# Patient Record
Sex: Female | Born: 1960
Health system: Southern US, Community
[De-identification: ages and names within clinical notes are randomized; demographics above are authoritative.]

## PROBLEM LIST (undated history)

## (undated) DIAGNOSIS — M7989 Other specified soft tissue disorders: Secondary | ICD-10-CM

## (undated) DIAGNOSIS — R8290 Unspecified abnormal findings in urine: Secondary | ICD-10-CM

## (undated) DIAGNOSIS — R413 Other amnesia: Secondary | ICD-10-CM

## (undated) DIAGNOSIS — K5792 Diverticulitis of intestine, part unspecified, without perforation or abscess without bleeding: Secondary | ICD-10-CM

## (undated) DIAGNOSIS — F329 Major depressive disorder, single episode, unspecified: Secondary | ICD-10-CM

## (undated) DIAGNOSIS — R079 Chest pain, unspecified: Secondary | ICD-10-CM

## (undated) DIAGNOSIS — F332 Major depressive disorder, recurrent severe without psychotic features: Secondary | ICD-10-CM

## (undated) DIAGNOSIS — L409 Psoriasis, unspecified: Secondary | ICD-10-CM

## (undated) DIAGNOSIS — F988 Other specified behavioral and emotional disorders with onset usually occurring in childhood and adolescence: Secondary | ICD-10-CM

## (undated) DIAGNOSIS — F32A Depression, unspecified: Secondary | ICD-10-CM

## (undated) DIAGNOSIS — N3000 Acute cystitis without hematuria: Secondary | ICD-10-CM

## (undated) DIAGNOSIS — Z96651 Presence of right artificial knee joint: Secondary | ICD-10-CM

## (undated) HISTORY — PX: KNEE SURGERY: SHX244

## (undated) HISTORY — DX: Psoriasis, unspecified: L40.9

## (undated) HISTORY — PX: APPENDECTOMY: SHX54

---

## 1997-11-02 ENCOUNTER — Other Ambulatory Visit: Admission: RE | Admit: 1997-11-02 | Discharge: 1997-11-02 | Payer: Self-pay | Admitting: Dermatology

## 2000-11-12 ENCOUNTER — Inpatient Hospital Stay (HOSPITAL_COMMUNITY): Admission: EM | Admit: 2000-11-12 | Discharge: 2000-11-14 | Payer: Self-pay | Admitting: *Deleted

## 2001-01-14 ENCOUNTER — Encounter: Payer: Self-pay | Admitting: Emergency Medicine

## 2001-01-14 ENCOUNTER — Emergency Department (HOSPITAL_COMMUNITY): Admission: EM | Admit: 2001-01-14 | Discharge: 2001-01-14 | Payer: Self-pay | Admitting: Emergency Medicine

## 2001-01-15 ENCOUNTER — Emergency Department (HOSPITAL_COMMUNITY): Admission: EM | Admit: 2001-01-15 | Discharge: 2001-01-15 | Payer: Self-pay | Admitting: Emergency Medicine

## 2001-01-15 ENCOUNTER — Encounter: Payer: Self-pay | Admitting: Emergency Medicine

## 2001-08-26 ENCOUNTER — Emergency Department (HOSPITAL_COMMUNITY): Admission: EM | Admit: 2001-08-26 | Discharge: 2001-08-27 | Payer: Self-pay | Admitting: Emergency Medicine

## 2001-08-28 ENCOUNTER — Encounter: Admission: RE | Admit: 2001-08-28 | Discharge: 2001-08-28 | Payer: Self-pay | Admitting: Psychiatry

## 2001-09-04 ENCOUNTER — Encounter: Admission: RE | Admit: 2001-09-04 | Discharge: 2001-09-04 | Payer: Self-pay | Admitting: Psychiatry

## 2001-09-08 ENCOUNTER — Encounter: Admission: RE | Admit: 2001-09-08 | Discharge: 2001-09-08 | Payer: Self-pay | Admitting: Psychiatry

## 2001-09-16 ENCOUNTER — Encounter: Admission: RE | Admit: 2001-09-16 | Discharge: 2001-09-16 | Payer: Self-pay | Admitting: *Deleted

## 2001-09-19 ENCOUNTER — Encounter: Admission: RE | Admit: 2001-09-19 | Discharge: 2001-09-19 | Payer: Self-pay | Admitting: *Deleted

## 2001-10-10 ENCOUNTER — Encounter: Admission: RE | Admit: 2001-10-10 | Discharge: 2001-10-10 | Payer: Self-pay | Admitting: *Deleted

## 2001-11-10 ENCOUNTER — Encounter: Admission: RE | Admit: 2001-11-10 | Discharge: 2001-11-10 | Payer: Self-pay | Admitting: *Deleted

## 2001-11-11 ENCOUNTER — Encounter: Admission: RE | Admit: 2001-11-11 | Discharge: 2001-11-11 | Payer: Self-pay | Admitting: *Deleted

## 2001-12-29 ENCOUNTER — Encounter: Admission: RE | Admit: 2001-12-29 | Discharge: 2001-12-29 | Payer: Self-pay | Admitting: *Deleted

## 2002-03-02 ENCOUNTER — Inpatient Hospital Stay (HOSPITAL_COMMUNITY): Admission: RE | Admit: 2002-03-02 | Discharge: 2002-03-04 | Payer: Self-pay | Admitting: Orthopedic Surgery

## 2002-03-02 ENCOUNTER — Encounter: Payer: Self-pay | Admitting: Orthopedic Surgery

## 2002-03-26 ENCOUNTER — Encounter: Admission: RE | Admit: 2002-03-26 | Discharge: 2002-03-26 | Payer: Self-pay | Admitting: *Deleted

## 2002-07-15 ENCOUNTER — Encounter: Admission: RE | Admit: 2002-07-15 | Discharge: 2002-07-15 | Payer: Self-pay | Admitting: *Deleted

## 2003-11-23 ENCOUNTER — Other Ambulatory Visit: Admission: RE | Admit: 2003-11-23 | Discharge: 2003-11-23 | Payer: Self-pay | Admitting: Family Medicine

## 2004-04-24 ENCOUNTER — Emergency Department (HOSPITAL_COMMUNITY): Admission: EM | Admit: 2004-04-24 | Discharge: 2004-04-24 | Payer: Self-pay | Admitting: Emergency Medicine

## 2004-12-20 ENCOUNTER — Other Ambulatory Visit: Admission: RE | Admit: 2004-12-20 | Discharge: 2004-12-20 | Payer: Self-pay | Admitting: Family Medicine

## 2006-03-20 ENCOUNTER — Other Ambulatory Visit: Admission: RE | Admit: 2006-03-20 | Discharge: 2006-03-20 | Payer: Self-pay | Admitting: Family Medicine

## 2006-09-19 ENCOUNTER — Encounter: Admission: RE | Admit: 2006-09-19 | Discharge: 2006-09-19 | Payer: Self-pay | Admitting: Family Medicine

## 2006-10-22 ENCOUNTER — Other Ambulatory Visit: Admission: RE | Admit: 2006-10-22 | Discharge: 2006-10-22 | Payer: Self-pay | Admitting: Family Medicine

## 2007-06-16 ENCOUNTER — Other Ambulatory Visit: Admission: RE | Admit: 2007-06-16 | Discharge: 2007-06-16 | Payer: Self-pay | Admitting: Obstetrics and Gynecology

## 2007-09-23 ENCOUNTER — Encounter: Admission: RE | Admit: 2007-09-23 | Discharge: 2007-09-23 | Payer: Self-pay | Admitting: Obstetrics and Gynecology

## 2008-01-04 ENCOUNTER — Emergency Department (HOSPITAL_COMMUNITY): Admission: EM | Admit: 2008-01-04 | Discharge: 2008-01-04 | Payer: Self-pay | Admitting: Emergency Medicine

## 2008-07-23 ENCOUNTER — Other Ambulatory Visit: Admission: RE | Admit: 2008-07-23 | Discharge: 2008-07-23 | Payer: Self-pay | Admitting: Obstetrics and Gynecology

## 2008-10-15 ENCOUNTER — Encounter: Admission: RE | Admit: 2008-10-15 | Discharge: 2008-10-15 | Payer: Self-pay | Admitting: Family Medicine

## 2009-07-03 ENCOUNTER — Emergency Department (HOSPITAL_BASED_OUTPATIENT_CLINIC_OR_DEPARTMENT_OTHER): Admission: EM | Admit: 2009-07-03 | Discharge: 2009-07-04 | Payer: Self-pay | Admitting: Emergency Medicine

## 2009-07-04 ENCOUNTER — Ambulatory Visit: Payer: Self-pay | Admitting: Diagnostic Radiology

## 2009-09-03 ENCOUNTER — Emergency Department (HOSPITAL_BASED_OUTPATIENT_CLINIC_OR_DEPARTMENT_OTHER): Admission: EM | Admit: 2009-09-03 | Discharge: 2009-09-04 | Payer: Self-pay | Admitting: Emergency Medicine

## 2009-09-03 ENCOUNTER — Ambulatory Visit: Payer: Self-pay | Admitting: Diagnostic Radiology

## 2009-11-04 ENCOUNTER — Encounter: Admission: RE | Admit: 2009-11-04 | Discharge: 2009-11-04 | Payer: Self-pay | Admitting: Obstetrics and Gynecology

## 2009-11-11 ENCOUNTER — Encounter: Admission: RE | Admit: 2009-11-11 | Discharge: 2009-11-11 | Payer: Self-pay | Admitting: Obstetrics and Gynecology

## 2009-12-05 ENCOUNTER — Ambulatory Visit: Payer: Self-pay | Admitting: Diagnostic Radiology

## 2009-12-05 ENCOUNTER — Emergency Department (HOSPITAL_BASED_OUTPATIENT_CLINIC_OR_DEPARTMENT_OTHER): Admission: EM | Admit: 2009-12-05 | Discharge: 2009-12-05 | Payer: Self-pay | Admitting: Emergency Medicine

## 2010-08-06 ENCOUNTER — Encounter: Payer: Self-pay | Admitting: Orthopedic Surgery

## 2010-08-06 ENCOUNTER — Encounter: Payer: Self-pay | Admitting: Obstetrics and Gynecology

## 2010-08-17 ENCOUNTER — Other Ambulatory Visit: Payer: Self-pay | Admitting: Obstetrics and Gynecology

## 2010-08-17 ENCOUNTER — Other Ambulatory Visit (HOSPITAL_COMMUNITY)
Admission: RE | Admit: 2010-08-17 | Discharge: 2010-08-17 | Disposition: A | Discharge status: Home / Self Care | Payer: 59 | Source: Ambulatory Visit | Attending: Obstetrics and Gynecology | Admitting: Obstetrics and Gynecology

## 2010-08-17 DIAGNOSIS — Z01419 Encounter for gynecological examination (general) (routine) without abnormal findings: Secondary | ICD-10-CM | POA: Insufficient documentation

## 2010-09-18 ENCOUNTER — Emergency Department (INDEPENDENT_AMBULATORY_CARE_PROVIDER_SITE_OTHER): Payer: 59

## 2010-09-18 ENCOUNTER — Emergency Department (HOSPITAL_BASED_OUTPATIENT_CLINIC_OR_DEPARTMENT_OTHER)
Admission: EM | Admit: 2010-09-18 | Discharge: 2010-09-18 | Disposition: A | Discharge status: Home / Self Care | Payer: 59 | Attending: Emergency Medicine | Admitting: Emergency Medicine

## 2010-09-18 DIAGNOSIS — R079 Chest pain, unspecified: Secondary | ICD-10-CM | POA: Insufficient documentation

## 2010-09-18 DIAGNOSIS — F329 Major depressive disorder, single episode, unspecified: Secondary | ICD-10-CM | POA: Insufficient documentation

## 2010-09-18 DIAGNOSIS — F3289 Other specified depressive episodes: Secondary | ICD-10-CM | POA: Insufficient documentation

## 2010-09-18 DIAGNOSIS — Z79899 Other long term (current) drug therapy: Secondary | ICD-10-CM | POA: Insufficient documentation

## 2010-09-18 DIAGNOSIS — J45909 Unspecified asthma, uncomplicated: Secondary | ICD-10-CM | POA: Insufficient documentation

## 2010-09-18 DIAGNOSIS — M542 Cervicalgia: Secondary | ICD-10-CM | POA: Insufficient documentation

## 2010-09-18 LAB — POCT CARDIAC MARKERS
Myoglobin, poc: 45.1 ng/mL (ref 12–200)
Troponin i, poc: <0.05 ng/mL (ref 0.00–0.09)

## 2010-09-18 LAB — CBC
HCT: 37.8 % (ref 36.0–46.0)
MCV: 90.4 fL (ref 78.0–100.0)
Platelets: 252 10*3/uL (ref 150–400)
RDW: 12.5 % (ref 11.5–15.5)

## 2010-09-18 LAB — BASIC METABOLIC PANEL: GFR calc non Af Amer: >60 mL/min (ref >60)

## 2010-09-18 LAB — DIFFERENTIAL
Basophils Absolute: 0 10*3/uL (ref 0.0–0.1)
Lymphocytes Relative: 35 % (ref 12–46)
Lymphs Abs: 1.7 10*3/uL (ref 0.7–4.0)
Neutro Abs: 2.6 10*3/uL (ref 1.7–7.7)

## 2010-10-02 LAB — DIFFERENTIAL
Basophils Absolute: 0.1 10*3/uL (ref 0.0–0.1)
Eosinophils Absolute: 0.1 10*3/uL (ref 0.0–0.7)
Lymphocytes Relative: 13 % (ref 12–46)
Lymphs Abs: 1.2 10*3/uL (ref 0.7–4.0)
Neutro Abs: 7.7 10*3/uL (ref 1.7–7.7)
Neutrophils Relative %: 80 % — ABNORMAL HIGH (ref 43–77)

## 2010-10-02 LAB — CBC
HCT: 37.5 % (ref 36.0–46.0)
MCHC: 32.9 g/dL (ref 30.0–36.0)
MCV: 91.7 fL (ref 78.0–100.0)
RBC: 4.09 MIL/uL (ref 3.87–5.11)

## 2010-10-02 LAB — URINALYSIS, ROUTINE W REFLEX MICROSCOPIC
Hgb urine dipstick: NEGATIVE
Nitrite: NEGATIVE
Urobilinogen, UA: 0.2 mg/dL (ref 0.0–1.0)

## 2010-10-02 LAB — COMPREHENSIVE METABOLIC PANEL
ALT: 15 U/L (ref 0–35)
AST: 25 U/L (ref 0–37)
Chloride: 109 mEq/L (ref 96–112)
Potassium: 4.3 mEq/L (ref 3.5–5.1)
Sodium: 141 mEq/L (ref 135–145)

## 2010-10-02 LAB — PREGNANCY, URINE: Preg Test, Ur: NEGATIVE

## 2010-10-16 LAB — URINALYSIS, ROUTINE W REFLEX MICROSCOPIC
Glucose, UA: NEGATIVE mg/dL
Hgb urine dipstick: NEGATIVE
Specific Gravity, Urine: 1.014 (ref 1.005–1.030)

## 2010-10-16 LAB — CBC
MCHC: 34.4 g/dL (ref 30.0–36.0)
MCV: 91.4 fL (ref 78.0–100.0)
RBC: 4.18 MIL/uL (ref 3.87–5.11)
WBC: 8.2 10*3/uL (ref 4.0–10.5)

## 2010-10-16 LAB — DIFFERENTIAL
Lymphocytes Relative: 22 % (ref 12–46)
Monocytes Absolute: 0.6 10*3/uL (ref 0.1–1.0)
Monocytes Relative: 7 % (ref 3–12)
Neutro Abs: 5.6 10*3/uL (ref 1.7–7.7)

## 2010-10-16 LAB — COMPREHENSIVE METABOLIC PANEL
Albumin: 4.1 g/dL (ref 3.5–5.2)
BUN: 14 mg/dL (ref 6–23)
CO2: 25 mEq/L (ref 19–32)
Calcium: 9.3 mg/dL (ref 8.4–10.5)
Chloride: 108 mEq/L (ref 96–112)
GFR calc Af Amer: >60 mL/min (ref >60)
Glucose, Bld: 96 mg/dL (ref 70–99)
Potassium: 4.1 mEq/L (ref 3.5–5.1)
Sodium: 143 mEq/L (ref 135–145)
Total Bilirubin: 0.3 mg/dL (ref 0.3–1.2)
Total Protein: 7.4 g/dL (ref 6.0–8.3)

## 2010-10-16 LAB — URINE MICROSCOPIC-ADD ON

## 2010-11-01 ENCOUNTER — Other Ambulatory Visit: Payer: Self-pay | Admitting: Obstetrics and Gynecology

## 2010-11-01 DIAGNOSIS — Z1231 Encounter for screening mammogram for malignant neoplasm of breast: Secondary | ICD-10-CM

## 2010-11-14 ENCOUNTER — Ambulatory Visit
Admission: RE | Admit: 2010-11-14 | Discharge: 2010-11-14 | Disposition: A | Discharge status: Home / Self Care | Payer: 59 | Source: Ambulatory Visit | Attending: Obstetrics and Gynecology | Admitting: Obstetrics and Gynecology

## 2010-11-14 DIAGNOSIS — Z1231 Encounter for screening mammogram for malignant neoplasm of breast: Secondary | ICD-10-CM

## 2010-12-01 NOTE — H&P (Signed)
NAME:  Tara [name], Tara [name] NO.:  [id number]   MEDICAL RECORD NO.:  [id number]                   PATIENT TYPE:   LOCATION:                                       FACILITY:   PHYSICIAN:  [id number]                                DATE OF BIRTH:  03-20-1961   DATE OF ADMISSION:  03/02/2002  DATE OF DISCHARGE:                                HISTORY & PHYSICAL   CHIEF COMPLAINT:  Progressively worsening right knee pain.   HISTORY OF PRESENT ILLNESS:  This 50 year old white female patient presented  initially to Dr. Hoy Register with complaints of right knee pain.  She has  had problems with her right knee on and off since the age of 42, when she  fell and suffered a medical meniscus tear and subsequently underwent an open  medial meniscectomy.  Since that time, she has had several procedures on her  knee, several while in high school, including a knee arthroscopy and then an  open chondroplasty, and then a second knee arthroscopy to check its progress  while she was still in high school.  She did well until about 1988, when she  was having more pain in her right knee and saw Dr. Cleophas Dunker.  At that time,  he did a right knee arthroscopy with a chondroplasty.  That did relieve her  pain for several years, but since that time it has been getting  progressively worse.   At this point, the right knee pain is described as a constant, dull type of  pain almost like a toothache which becomes sharp with certain movements.  The pain seems to be located about her medial joint line with occasional  radiation distally into the proximal tibia.  The pain increases with  prolonged weightbearing and decreases with rest, the use of Advil, or a  heating pad on her knee.  The knee does feel like it slips at times, and  almost gives way on her.  The knee does swell, and also she feels like she  can feel it grinding as she moves it.  There is no locking or catching in  the knee.  She  does have occasional night pain if she has been on her feet a  lot during that day.  She is not ambulating with any assistive devices.  She  has had a cortisone shot in the knee in the past for a total of two times.  The first time helped for a short amount of time, and the second one did not  relieve her pain at all.  She was referred to Dr. Sherlean Foot by Dr. Cleophas Dunker due  to her medial compartment osteoarthritis and the need for uniarthroplasty.'   ALLERGIES:  CODEINE causes pruritus and a rash.   CURRENT MEDICATIONS:  1. Lexapro 10 mg p.o. q.d.  2. Serevent inhaler one puff inhaled q.d. p.r.n. asthma.  Last dose last     week.  3. Minocycline 100 mg p.o. q.d.  4. Albuterol inhaler 1 puff inhaled q.4h. p.r.n. asthma.  Last dose     approximately two months ago.  5. Advil 200 mg two tablets p.o. q.4h. p.r.n. pain.   PAST MEDICAL HISTORY:  1. Asthma, she had exercise induced asthma as a child, and then had several     years without symptoms, and now has just seasonal asthma.  2. Acne.  3. Depression, she has had this intermittently for the last 20 years.  4. Psoriasis.  She has this mainly on her elbows now, and that was diagnosed     at age 60.   She denies any history of diabetes mellitus, hypertension, thyroid disease,  hiatal hernia, peptic ulcer disease, reflux, heart disease, or any other  chronic medical condition other than noted previously.    PAST SURGICAL HISTORY:  1. Open medial meniscectomy with bone spur excision while she was in high     school.  2. Right knee arthroscopy while in high school.  3. Open chondroplasty medial compartment right knee while in high school.  4. Right knee arthroscopy and chondroplasty while in high school.  5. Right knee arthroscopy with chondroplasty in 1988 by Dr. Claude Manges.     [place].  6. Appendectomy at age 38.   SOCIAL HISTORY:  She denies any history of cigarette smoking, alcohol use,  or drug use.  She is married and has four  children.  She, her husband and  her four children live in a two story house with six steps into the main  entrance.  Her bedroom is on the second floor, but she has a first floor  bedroom that she may use after surgery.  She is currently an Recruitment consultant.  Her medical doctor is Dr. Merri Brunette at [facility], and her phone  number is [phone].   FAMILY HISTORY:  Her mother is alive at age 48 with a history of glaucoma  and possibly respiratory disease due to significant smoking.  Her father  died at the age of 68 with hypertension and congestive heart failure.  Her  brother is alive at age 55 with diabetes mellitus, and she has one sister  alive at age 62 with hypertension.  Her children, she has two sons and two  daughters.  They are all alive and healthy, and are age 73, 45, 84, and 35.   REVIEW OF SYMPTOMS:  She did have a heart murmur noted as a child, but has  not had to take any antibiotics before dental work, nor has been told she  has one as an adult.  She does have nocturia about once a night.  Her last  normal period was approximately three weeks ago.  She is a gravida 5, para 4-  0-1-4.  She does wear contacts and glasses.  She does have a living will,  and her power of attorney is Dorise Bullion.  All other systems are negative  and noncontributory.   PHYSICAL EXAMINATION:  GENERAL:  A well-developed, well-nourished thin white  female in no acute distress.  Walks without a noticeable limp.  Mood and  affect are appropriate.  Talks easily with the examiner.  VITAL SIGNS:  Height 5 feet 7 inches, weight 158 pounds, BMI is 24.  Temperature 98.8 degrees Fahrenheit, pulse 56, respirations 12, and blood  pressure 112/74.  HEENT:  Normocephalic, atraumatic without frontal or maxillary sinus  tenderness to palpation.  Conjunctivae pink.  Sclerae anicteric.  Pupils equal, round, reactive to light and accommodation.  Extraocular movements  were intact.  No visable external  ear deformities.  Hearing is grossly  intact.  Tympanic membranes are pearly gray bilaterally with good light  reflex.  Nose and nasal septum midline.  Nasal mucosa pink and moist without  exudates or polyps noted.  Buccal mucosa pink and moist..  Good dentition.  Pharynx without erythema or exudates.  Tongue and uvula midline.  Tongue  without fasciculations and uvula rises equally with phonation.  NECK:  No visable masses or lesions noted.  Trachea midline.  No palpable  lymphadenopathy nor thyromegaly.  Carotids are +2 bilaterally without  bruits.  Full range of motion and nontender to palpation along the cervical  spine.  CARDIOVASCULAR:  Heart regular rate and rhythm.  S1 and S2 present without  rubs, clicks, or murmurs noted.  RESPIRATORY:  Respiratory even and unlabored.  Breath sounds clear to  auscultation bilaterally without rales or wheezes noted.  ABDOMEN:  Slightly rounded abdominal contour.  Bowel sounds present in all  four quadrants.  Soft and nontender to palpation without hepatosplenomegaly  nor CVA tenderness.  Femoral pulses are +2 bilaterally.  Nontender to  palpation along the entire length of the vertebral collum.  BREASTS/GENITOURINARY/RECTAL/PELVIC:  These exams deferred at this time.  MUSCULOSKELETAL:  No obvious deformities of the bilateral upper extremities  with full range of motion of these extremities without pain.  Radial pulses  are +2 bilaterally.  She has full range of motion of her hips, ankles, and  toes bilaterally.  DP and PT pulses are +1.  No lower extremity pedal edema.  Left knee has no obvious deformity with the exception of a well-healed kind  of irregular scar over the anterior portion of the knee.  There is no  effusion in the knee.  Skin is otherwise intact without erythema or  ecchymoses.  She has full extension and flexion to 135 degrees without  crepitus.  There is no pain with palpation along the joint line, and leg is  stable to varus  and valgus stress.  No calf pain with palpation.  Right knee has a well-healed medial incision across her anterior joint line.  The skin is otherwise intact without erythema or ecchymoses.  She has full  extension and flexion of the right knee to 135 degrees also.  There is,  however, a moderate amount of crepitus noted along the medial aspect of the  joint with range of motion of the knee.  She is acutely tender to palpation  on both the medial and lateral joint line, but the medial joint line is more  tender then the lateral.  There is no effusion in the knee at this time, and  she is stable to varus and valgus stress.  No calf pain with palpation.  NEUROLOGIC:  Alert and oriented x3.  Cranial nerves II-XII are grossly  intact.  Strength is 5/5 bilateral upper and lower extremities.  Rapid  alternating movements intact.  Deep tendon reflexes 3+ bilateral upper and lower extremities.  Sensation intact to light touch and rapid alternating  movements intact.   RADIOLOGIC FINDINGS:  X-rays taken of her right knee in 7/03, showed  complete joint space loss and an osteophyte noted in the medial compartment  of the right knee.  There is also an osteophyte noted on the medial side  of  the patella.   IMPRESSION:  1. Osteoarthritis of medial compartment of the right knee.  2. Asthma.  3. Acne.  4. Depression.  5. Psoriasis.   PLAN:  The patient will be admitted to [facility] on 03/02/02, where  she will undergo a right unicompartmental knee arthroscopy by Dr. Mila Homer.  [place].  She will undergo all the routine preoperative laboratory tests and  studies prior to this procedure.      Legrand Pitts Duffy, P.A.                      [id number]    KED/MEDQ  D:  02/26/2002  T:  02/26/2002  Job:  [id number]

## 2010-12-01 NOTE — H&P (Signed)
[facility]  Patient:    Tara [name], Tara [name]                       MRN: [id number] Adm. Date:  [id number] Attending:  Lonia Blood CC:         Dario Guardian, M.D.   History and Physical  DATE OF BIRTH:  1961/02/03  PROBLEM LIST: 1. Intractable nausea, vomiting, and diarrhea suspicious for food poisoning. 2. Severe dehydration. 3. Microcytic anemia, hemoglobin 10.7, MCV 76. 4. Depression. 5. Acne. 6. Status post appendectomy about 20 years ago. 7. Allergy to CODEINE pruritus).  CHIEF COMPLAINT:  Nausea, vomiting, diarrhea.  HISTORY OF PRESENT ILLNESS:  Mrs. Tara [name] is a very pleasant 50 year old female who presents with intractable nausea, vomiting, and diarrhea (liquid stools, nonbloody) since this morning.  The patient was at a birthday party this weekend with her family and older age people.  Of these 13 people about 10 have had the same symptoms over the last 48 hours.  The patient ate homemade ice cream and cake.  The other two to three people that have not had any symptoms so far did not eat the cake, though they had ice cream made by the Lilibertys friends.  The patient denies any blood, diarrhea stools, melena in the bowel movements nor prior to the onset of symptoms.  The patient was taking minocycline for about seven days for acne.  There was not any recent trips to the beach or to any foreign countries.  The patient also described some malaise that started this afternoon as well as subjective fever and chills.  She describes cramping abdominal symptoms.  No skin rash or arthralgias.  No headache.  No rhinorrhea.  No cough.  No shortness of breath. No chest pain.  No lower back pain.  No urinary symptoms.  No illegal drug use.  No recent medication changes except for the minocycline described previously.  No syncope or presyncope.  No lower extremity swelling.  PAST MEDICAL HISTORY:  As in problem list.  ALLERGIES:  As in  problem list.  MEDICATIONS: 1. Minocycline 100 mg t.i.d. 2. Celexa 20 mg p.o. q.d.  FAMILY HISTORY:  No diabetes, early coronary artery disease, malignancy in the family.  The patients father, brother, and sister have hypertension.  The patients father had a stroke in his 55s.  SOCIAL HISTORY:  The patient is married and works for [employer].  She has three children ages 33, 62, and 40.  No alcohol nor tobacco use.  No illegal drug use.  REVIEW OF SYSTEMS:  As in HPI.  PHYSICAL EXAMINATION  VITAL SIGNS:  Temperature 101.6, heart rate 87, respiratory rate 16, blood pressure 102/55.  HEENT:  Normocephalic, atraumatic.  Non-icteric sclerae.  Conjunctivae within normal limits, though slightly pale.  PERRLA.  EOMI.  Funduscopic examination: Limited, though no papilledema noticed.  Very dry mucous membranes.  TMs within normal limits.  Oropharynx:  Clear.  NECK:  Supple.  No JVD.  No bruits.  No lymphadenopathy.  No thyromegaly.  LUNGS:  Clear to auscultation bilaterally without crackles, wheezes.  Good air movement bilaterally.  CARDIAC:  Regular rate and rhythm with 1/6 systolic ejection murmur at the left lower sternal border and nonradiating.  No gallops or rubs.  ABDOMEN:  Flat, slight tenderness diffusely.  Bowel sounds were present, though slightly decreased.  No rebound.  No guarding.  No hepatosplenomegaly. No masses.  No bruits.  BREASTS:  Within  normal limits.  GENITOURINARY:  Within normal limits.  RECTAL:  Not done.  EXTREMITIES:  No edema, clubbing, or cyanosis.  Pulses 2+ bilaterally.  NEUROLOGIC:  Alert and oriented x 3.  The patient was slightly lethargic after given a dose of Phenergan, though she was able to cooperate and respond to all the questions.  Strength 5/5 in extremities.  DTRs 3/5 in all extremities. Cranial nerves 2-12 intact.  Sensorium intact.  Plantar reflexes downgoing bilaterally.  LABORATORIES:  Urine pregnancy test negative.   Sodium 140, potassium 4.0, chloride 109, CO2 25, BUN 14, creatinine 1.0, glucose 118.  LFTs within normal limits.  Urinalysis pending.  Hemoglobin 10.7, MCV 76, WBC 12.5, absolute neutrophil count 11.7, platelets 290,000.  ASSESSMENT AND PLAN: 1. Sign and symptom complex (intractable nausea with vomiting and diarrhea).    Given the history of presentation significant for the recent birthday party    in which 10 out of the 13 people that attended this party got sick with the    same symptoms, food poisoning is very likely.  The patient describes    liquidy bowel movements without evidence of blood.  Staphylococcus ______    seems to be the most frequent culprit in this situation, though other    etiologies like ______, and E. coli have been reported.  Also, C.    difficile colitis could be considered, though less likely given the    concurrent antibiotic use.  Finally, other etiologies at this point seem to    be less likely are viral gastroenteritis, gallbladder disease, inflammatory    bowel disease, pancreatitis, ______.  Given the patients age, bowel    ischemia is very unlikely and the pregnancy test was negative.  For now    will admit the patient to a regular bed for supportive care with    intravenous fluids and intravenous Protonix.  Will obtain stool samples for    culture, O&P, white cells, and C. difficile toxin SA.  At this point I will    not start empiric antibiotic therapy.  The CBC will be repeated to follow    on the patients leukocytosis tomorrow morning as well as the LFTs and    electrolytes.  If no improvement overnight with supportive care, empiric    antibiotic therapy as well as CT scan of the abdomen and pelvis could be    obtained.  Also, Imodium could be used as needed if the diarrhea were to    persist after the stool studies are obtained. 2. Severe dehydration.  Physical examination is significant for severe degree    of dehydration.  For now will start with  aggressive intravenous hydration     therapy.  The patient does not have any previous history of cardiac    problems.  Will follow-up strict fluid balance, daily weights, and    orthostatic blood pressure every morning. 3. Microcytic anemia.  The patient reports normal periods and there is not any    evidence of bleeding prior to the onset of the symptoms.  The patient went    to donate blood about a week ago and she was told that she had anemia and    therefore she was declined as a potential candidate for blood donation.    The patient also denies any use of NSAID therapy.  Given the acute illness,    will postpone the anemia workup for the outpatient setting by the primary    care Alysabeth Scalia.  For now  will use Protonix for gastrointestinal protection    as described in problem #1 and the stools will be sent for guaiac.  A new    hemoglobin will be obtained in the morning to reevaluate the patients    anemia. If acute bleeding were to be noticed or if the patient were to have    a significant drop in the hemoglobin, further anemia workup could be    considered otherwise. DD:  11/12/00 TD:  11/12/00 Job: [id number]

## 2010-12-01 NOTE — Op Note (Signed)
[name], [name]                          ACCOUNT NO.:  [id number]   MEDICAL RECORD NO.:  [id number]                   PATIENT TYPE:  INP   LOCATION:  5035                                 FACILITY:  [facility]   PHYSICIAN:  Mila Homer. Sherlean Foot, M.D.              DATE OF BIRTH:  Mar 29, 1961   DATE OF PROCEDURE:  03/02/2002  DATE OF DISCHARGE:                                 OPERATIVE REPORT   PREOPERATIVE DIAGNOSES:   POSTOPERATIVE DIAGNOSES:   OPERATION PERFORMED:   SURGEON:  Mila Homer. Sherlean Foot, M.D.   ASSISTANT:  Jamelle Rushing, P.A.   ANESTHESIA:  General.   INDICATIONS FOR PROCEDURE:  The patient is a young white female in her 19s  with osteoarthritis of the right knee status post a medial meniscectomy many  years ago.  While she has failed conservative treatment, arthroscopy and  informed consent was obtained.   DESCRIPTION OF PROCEDURE:  The patient was laid supine and administered  general anesthesia.  The right lower extremity was prepped and draped in the  usual sterile fashion.  The old incision was used and made with a #10 blade.  A fresh 10 blade was used to make an arthrotomy and remove some of the  posterior fat pad.  We subperiosteally dissected off the medial crest of the  tibia and there was no medial meniscus to remove.  I did go up almost to the  superior pole of the patellar with the arthrotomy.  Then I made a mark on  the femoral condyle for rotation. I then took off the anterior lip of the  tibial plateau and placed the leg in extension.  I used the C-arm and after  putting the tensioning device in the knee in the medial compartment along  the mechanical axis and pinned it into place.  At this point we removed the  cut surface  of the femur and put in the tibial cutter perpendicular to the  anatomic axis of the tibia and placed the knee in flexion and cut the tibial  medial plateau.  On we removed this bone piece and freshened up the vertical  portion of the  cut to be flush, we sized it at a size 2.  We then keeled and  drove it in preparation for a size 2 implant.  We then turned our attention  to the femur.  We put in a regular plus femoral component trial in proper  orientation, pinned it and cut the Chamfer and posterior cut.  At this point  we irrigated the knee, mixed our cement and cemented in the femoral  component first, the tibial component second and then trialed with 8 and 10  mm inserts.  The 8 mm insert gave a good 1 to 2 mm opening in flexion and  extension.  We then let the tourniquet down, put a Hemovac deep to the  arthrotomy and closed  with interrupted #1, interrupted 0 and running 2-0  Vicryl and skin staples.  We dressed with Adaptic, 4 x 4s, sterile Webril  and TED stocking.  The patient tolerated the procedure well.   TOURNIQUET TIME:  65 minutes.    COMPLICATIONS:  None.   DRAINS:  None.                                                  Mila Homer. Sherlean Foot, M.D.    SDL/MEDQ  D:  03/03/2002  T:  03/04/2002  Job:  [id number]

## 2011-04-12 LAB — POCT CARDIAC MARKERS
CKMB, poc: 1.4
Troponin i, poc: <0.05

## 2011-04-12 LAB — D-DIMER, QUANTITATIVE: D-Dimer, Quant: 0.27

## 2011-08-21 ENCOUNTER — Other Ambulatory Visit: Payer: Self-pay | Admitting: Obstetrics and Gynecology

## 2011-08-21 ENCOUNTER — Other Ambulatory Visit (HOSPITAL_COMMUNITY)
Admission: RE | Admit: 2011-08-21 | Discharge: 2011-08-21 | Disposition: A | Discharge status: Home / Self Care | Payer: 59 | Source: Ambulatory Visit | Attending: Obstetrics and Gynecology | Admitting: Obstetrics and Gynecology

## 2011-08-21 DIAGNOSIS — Z01419 Encounter for gynecological examination (general) (routine) without abnormal findings: Secondary | ICD-10-CM | POA: Insufficient documentation

## 2011-09-27 ENCOUNTER — Encounter (HOSPITAL_BASED_OUTPATIENT_CLINIC_OR_DEPARTMENT_OTHER): Payer: Self-pay | Admitting: *Deleted

## 2011-09-27 ENCOUNTER — Emergency Department (INDEPENDENT_AMBULATORY_CARE_PROVIDER_SITE_OTHER): Payer: 59

## 2011-09-27 ENCOUNTER — Emergency Department (HOSPITAL_BASED_OUTPATIENT_CLINIC_OR_DEPARTMENT_OTHER)
Admission: EM | Admit: 2011-09-27 | Discharge: 2011-09-27 | Disposition: A | Discharge status: Home / Self Care | Payer: 59 | Attending: Emergency Medicine | Admitting: Emergency Medicine

## 2011-09-27 DIAGNOSIS — R079 Chest pain, unspecified: Secondary | ICD-10-CM

## 2011-09-27 DIAGNOSIS — R0602 Shortness of breath: Secondary | ICD-10-CM

## 2011-09-27 DIAGNOSIS — R1013 Epigastric pain: Secondary | ICD-10-CM

## 2011-09-27 DIAGNOSIS — K2970 Gastritis, unspecified, without bleeding: Secondary | ICD-10-CM

## 2011-09-27 DIAGNOSIS — M549 Dorsalgia, unspecified: Secondary | ICD-10-CM | POA: Insufficient documentation

## 2011-09-27 DIAGNOSIS — R110 Nausea: Secondary | ICD-10-CM | POA: Insufficient documentation

## 2011-09-27 DIAGNOSIS — K2990 Gastroduodenitis, unspecified, without bleeding: Secondary | ICD-10-CM | POA: Insufficient documentation

## 2011-09-27 DIAGNOSIS — J45909 Unspecified asthma, uncomplicated: Secondary | ICD-10-CM | POA: Insufficient documentation

## 2011-09-27 DIAGNOSIS — R1012 Left upper quadrant pain: Secondary | ICD-10-CM | POA: Insufficient documentation

## 2011-09-27 DIAGNOSIS — R0789 Other chest pain: Secondary | ICD-10-CM

## 2011-09-27 HISTORY — DX: Depression, unspecified: F32.A

## 2011-09-27 HISTORY — DX: Major depressive disorder, single episode, unspecified: F32.9

## 2011-09-27 LAB — DIFFERENTIAL
Basophils Absolute: 0 10*3/uL (ref 0.0–0.1)
Eosinophils Relative: 2 % (ref 0–5)
Lymphocytes Relative: 27 % (ref 12–46)
Lymphs Abs: 1.5 10*3/uL (ref 0.7–4.0)
Monocytes Absolute: 0.5 10*3/uL (ref 0.1–1.0)
Monocytes Relative: 8 % (ref 3–12)

## 2011-09-27 LAB — URINALYSIS, ROUTINE W REFLEX MICROSCOPIC
Bilirubin Urine: NEGATIVE
Glucose, UA: NEGATIVE mg/dL
Hgb urine dipstick: NEGATIVE
Ketones, ur: NEGATIVE mg/dL
pH: 5.5 (ref 5.0–8.0)

## 2011-09-27 LAB — COMPREHENSIVE METABOLIC PANEL
AST: 32 U/L (ref 0–37)
Albumin: 3.5 g/dL (ref 3.5–5.2)
Chloride: 105 mEq/L (ref 96–112)
Creatinine, Ser: 1 mg/dL (ref 0.50–1.10)
Potassium: 3.7 mEq/L (ref 3.5–5.1)
Total Bilirubin: 0.3 mg/dL (ref 0.3–1.2)

## 2011-09-27 LAB — CBC
HCT: 40.7 % (ref 36.0–46.0)
MCV: 90.2 fL (ref 78.0–100.0)
RDW: 13.5 % (ref 11.5–15.5)
WBC: 5.6 10*3/uL (ref 4.0–10.5)

## 2011-09-27 MED ORDER — GI COCKTAIL ~~LOC~~
30.0000 mL | Freq: Once | ORAL | Status: AC
  Administered 2011-09-27: 30 mL via ORAL
  Filled 2011-09-27: qty 30

## 2011-09-27 MED ORDER — FAMOTIDINE 20 MG PO TABS: 20.0000 mg | ORAL_TABLET | Freq: Two times a day (BID) | ORAL | Status: AC | PRN

## 2011-09-27 MED ORDER — FAMOTIDINE IN NACL 20-0.9 MG/50ML-% IV SOLN
20.0000 mg | Freq: Once | INTRAVENOUS | Status: AC
  Administered 2011-09-27: 20 mg via INTRAVENOUS
  Filled 2011-09-27: qty 50

## 2011-09-27 MED ORDER — SODIUM CHLORIDE 0.9 % IV SOLN
INTRAVENOUS | Status: DC
  Administered 2011-09-27: 125 mL/h via INTRAVENOUS

## 2011-09-27 MED ORDER — ONDANSETRON HCL 4 MG/2ML IJ SOLN
4.0000 mg | Freq: Once | INTRAMUSCULAR | Status: AC
  Administered 2011-09-27: 4 mg via INTRAVENOUS
  Filled 2011-09-27: qty 2

## 2011-09-27 MED ORDER — ONDANSETRON HCL 8 MG PO TABS: 8.0000 mg | ORAL_TABLET | Freq: Three times a day (TID) | ORAL | Status: AC | PRN

## 2011-09-27 MED ORDER — HYDROMORPHONE HCL PF 1 MG/ML IJ SOLN
1.0000 mg | Freq: Once | INTRAMUSCULAR | Status: AC
  Administered 2011-09-27: 1 mg via INTRAVENOUS
  Filled 2011-09-27: qty 1

## 2011-09-27 NOTE — ED Notes (Signed)
Pt. In bed one complains of severe burning sensation is mid chest that radiates around towards lower back. States "I'm really nauseous". Denies any vomiting.

## 2011-09-27 NOTE — ED Provider Notes (Addendum)
History     CSN: [id number]  Arrival date & time 09/27/11  4132   First MD Initiated Contact with Patient 09/27/11 [phone]      Chief Complaint  Patient presents with  . Chest Pain  . Nausea  . Back Pain    (Consider location/radiation/quality/duration/timing/severity/associated sxs/prior treatment) HPI Comments: The patient is a 51 year old female with a known history of gallstones and similar symptoms approximately 2 years ago at the time of diagnosis of gallstones. She reports that symptoms began last night around 10:00 PM with nausea and epigastric discomfort, described as burning. She denies any frank chest pain or shortness of breath or palpitations. She had eaten last at 8:00 PM and had a normal bowel movement last night without blood or mucus in it.  Patient is a 51 y.o. female presenting with abdominal pain. The history is provided by the patient.  Abdominal Pain The primary symptoms of the illness include abdominal pain and nausea. The primary symptoms of the illness do not include fever, fatigue, shortness of breath, vomiting, diarrhea, hematemesis, hematochezia, dysuria, vaginal discharge or vaginal bleeding. The current episode started 6 to 12 hours ago. The onset of the illness was gradual. The problem has been gradually worsening.  The abdominal pain began 6 to 12 hours ago. The pain came on gradually. The abdominal pain has been gradually worsening since its onset. The abdominal pain is located in the LUQ and epigastric region. The abdominal pain radiates to the back. The abdominal pain is relieved by nothing. The abdominal pain is exacerbated by eating.  The patient states that she believes she is currently not pregnant. The patient has not had a change in bowel habit. Additional symptoms associated with the illness include anorexia, heartburn and back pain. Symptoms associated with the illness do not include chills, diaphoresis, constipation, urgency, hematuria or frequency.  Significant associated medical issues include gallstones.    Past Medical History  Diagnosis Date  . Depression   . Asthma     History reviewed. No pertinent past surgical history.  History reviewed. No pertinent family history.  History  Substance Use Topics  . Smoking status: Never Smoker   . Smokeless tobacco: Not on file  . Alcohol Use:     OB History    Grav Para Term Preterm Abortions TAB SAB Ect Mult Living                  Review of Systems  Constitutional: Positive for appetite change. Negative for fever, chills, diaphoresis and fatigue.  HENT: Negative.   Eyes: Negative.   Respiratory: Negative for cough and shortness of breath.   Cardiovascular: Negative for chest pain and palpitations.  Gastrointestinal: Positive for heartburn, nausea, abdominal pain and anorexia. Negative for vomiting, diarrhea, constipation, blood in stool, hematochezia, abdominal distention and hematemesis.  Genitourinary: Negative for dysuria, urgency, frequency, hematuria, flank pain, vaginal bleeding and vaginal discharge.  Musculoskeletal: Positive for back pain.  Skin: Negative.   Neurological: Negative.   Psychiatric/Behavioral: Negative.     Allergies  Codeine  Home Medications   Current Outpatient Rx  Name Route Sig Dispense Refill  . CICLESONIDE 160 MCG/ACT IN AERS Inhalation Inhale 1 puff into the lungs 2 (two) times daily.    Marland Kitchen FLUOXETINE HCL 20 MG PO CAPS Oral Take 20 mg by mouth daily.    . MOMETASONE FUROATE 50 MCG/ACT NA SUSP Nasal Place 2 sprays into the nose daily.      BP 121/44  Pulse 61  Temp 98.4 F (36.9 C)  Resp 12  SpO2 100%  Physical Exam  Nursing note and vitals reviewed. Constitutional: She is oriented to person, place, and time. She appears well-developed and well-nourished. No distress.  HENT:  Head: Normocephalic and atraumatic.  Mouth/Throat: Oropharynx is clear and moist.  Eyes: Conjunctivae and EOM are normal. Pupils are equal, round, and  reactive to light. No scleral icterus.  Neck: Normal range of motion. Neck supple. No JVD present. No tracheal deviation present.  Cardiovascular: Normal rate, regular rhythm and normal heart sounds.  Exam reveals no gallop and no friction rub.   No murmur heard. Pulmonary/Chest: Effort normal and breath sounds normal. No stridor. No respiratory distress. She has no wheezes. She has no rales. She exhibits no tenderness.  Abdominal: Soft. Normal appearance and bowel sounds are normal. She exhibits no distension, no ascites, no pulsatile midline mass and no mass. There is no hepatosplenomegaly. There is tenderness in the epigastric area. There is no rigidity, no rebound, no guarding, no CVA tenderness and negative Murphy's sign.  Musculoskeletal: Normal range of motion. She exhibits no edema and no tenderness.  Lymphadenopathy:    She has no cervical adenopathy.  Neurological: She is alert and oriented to person, place, and time.  Skin: Skin is warm and dry. No rash noted. She is not diaphoretic. No erythema.  Psychiatric: She has a normal mood and affect. Her behavior is normal.    ED Course  Procedures (including critical care time)   Date: 09/27/2011  Rate: 55  Rhythm: sinus bradycardia  QRS Axis: normal  Intervals: normal  ST/T Wave abnormalities: normal  Conduction Disutrbances: none  Narrative Interpretation: unremarkable      Labs Reviewed  COMPREHENSIVE METABOLIC PANEL  LIPASE, BLOOD  CBC  DIFFERENTIAL  URINALYSIS, ROUTINE W REFLEX MICROSCOPIC  TROPONIN I   No results found.   No diagnosis found.    MDM  Gastritis, peptic ulcer disease, biliary colic, cholelithiasis, cholecystitis, cholangitis, hepatitis, renal colic, urinary tract infection, colitis, gastroenteritis all considered among other etiologies in the patient's differential diagnosis. This would be very atypical presentation of myocardial ischemia or cardiac chest pain, but do to the location of the pain  and is vague nature, I will evaluate an EKG, chest x-ray and a single cardiac enzymes to assure this is not the case.        Felisa Bonier, MD 09/27/11 2440  Felisa Bonier, MD 09/27/11 0912  10:22 AM The patient reports significant improvement in her symptoms with treatment with anti-emetic and antacid, as well as following ingestion of the GI cocktail. Her examination and symptoms in light of her lab testing and imaging appeared most consistent with gastritis, possibly a viral gastritis that may turn into a gastroenteritis. I do not see evidence suggestive of cholecystitis, choledocholithiasis, or pancreatitis.  Felisa Bonier, MD 09/27/11 1023

## 2011-09-27 NOTE — ED Notes (Signed)
Patient reports unable to void at this time.  Labeled specimen cup left with patient.

## 2011-09-27 NOTE — Discharge Instructions (Signed)
Gastritis Gastritis is an inflammation (the body's way of reacting to injury and/or infection) of the stomach. It is often caused by viral or bacterial (germ) infections. It can also be caused by chemicals (including alcohol) and medications. This illness may be associated with generalized malaise (feeling tired, not well), cramps, and fever. The illness may last 2 to 7 days. If symptoms of gastritis continue, gastroscopy (looking into the stomach with a telescope-like instrument), biopsy (taking tissue samples), and/or blood tests may be necessary to determine the cause. Antibiotics will not affect the illness unless there is a bacterial infection present. One common bacterial cause of gastritis is an organism known as H. Pylori. This can be treated with antibiotics. Other forms of gastritis are caused by too much acid in the stomach. They can be treated with medications such as H2 blockers and antacids. Home treatment is usually all that is needed. Young children will quickly become dehydrated (loss of body fluids) if vomiting and diarrhea are both present. Medications may be given to control nausea. Medications are usually not given for diarrhea unless especially bothersome. Some medications slow the removal of the virus from the gastrointestinal tract. This slows down the healing process. HOME CARE INSTRUCTIONS Home care instructions for nausea and vomiting:  For adults: drink small amounts of fluids often. Drink at least 2 quarts a day. Take sips frequently. Do not drink large amounts of fluid at one time. This may worsen the nausea.   Only take over-the-counter or prescription medicines for pain, discomfort, or fever as directed by your caregiver.   Drink clear liquids only. Those are anything you can see through such as water, broth, or soft drinks.   Once you are keeping clear liquids down, you may start full liquids, soups, juices, and ice cream or sherbet. Slowly add bland (plain, not spicy)  foods to your diet.  Home care instructions for diarrhea:  Diarrhea can be caused by bacterial infections or a virus. Your condition should improve with time, rest, fluids, and/or anti-diarrheal medication.   Until your diarrhea is under control, you should drink clear liquids often in small amounts. Clear liquids include: water, broth, jell-o water and weak tea.  Avoid:  Milk.   Fruits.   Tobacco.   Alcohol.   Extremely hot or cold fluids.   Too much intake of anything at one time.  When your diarrhea stops you may add the following foods, which help the stool to become more formed:  Rice.   Bananas.   Apples without skin.   Dry toast.  Once these foods are tolerated you may add low-fat yogurt and low-fat cottage cheese. They will help to restore the normal bacterial balance in your bowel. Wash your hands well to avoid spreading bacteria (germ) or virus. SEEK IMMEDIATE MEDICAL CARE IF:   You are unable to keep fluids down.   Vomiting or diarrhea become persistent (constant).   Abdominal pain develops, increases, or localizes. (Right sided pain can be appendicitis. Left sided pain in adults can be diverticulitis.)   You develop a fever (an oral temperature above 102 F (38.9 C)).   Diarrhea becomes excessive or contains blood or mucus.   You have excessive weakness, dizziness, fainting or extreme thirst.   You are not improving or you are getting worse.   You have any other questions or concerns.  Document Released: 06/26/2001 Document Revised: 06/21/2011 Document Reviewed: 07/02/2005 [facility] Patient Information 2012 [place], [place].  RESOURCE GUIDE  Dental Problems  Patients with  Medicaid: [facility] [phone]                                           [phone] Phone:  [phone]                                                  Phone:  [phone]  If unable to pay or uninsured, contact:   [facility] or [facility]. to become qualified for the adult dental clinic.  Chronic Pain Problems Contact [facility]  [phone] Patients need to be referred by their primary care doctor.  Insufficient Money for Medicine Contact United Way:  call "211" or [facility] 502-459-4633.  No Primary Care Doctor Call [facility]  [phone] Other agencies that provide inexpensive medical care    [facility]  [phone]    [facility]  [phone]    [facility]  [phone]    [facility]  [phone]    [facility]  [phone]    [facility]  [phone]  Psychological Services [facility] Behavioral Health  [phone] [facility] Services  [phone] [facility]   [phone] (emergency services [phone])  Substance Abuse Resources [facility]  [phone] [facility] [phone] [facility] [place] [phone] Floydene Flock [phone] Residential & Outpatient Substance Abuse Program  [phone]  Abuse/Neglect [facility] Child Abuse Hotline [phone] [facility] Child Abuse Hotline [phone] (After Hours)  Emergency Shelter [facility] Ministries [phone]  Maternity Homes [facility] [place] [facility] [place] [phone] [facility] [phone]  MRSA Hotline #:   786-217-1715    [facility] Resources  [facility] [place]     United [employer]                          [facility] Dept. [address] [place]                       [address]      [address]  Blondell Reveal Phone:  [phone]                                   Phone:  [phone]                 Phone:  [phone]  [facility] Mental Health Phone:  [phone]  [facility] Child Abuse Hotline 614-572-4784)  [phone] (After Hours)

## 2011-09-27 NOTE — ED Notes (Signed)
Pt amb to restroom with quick steady gait in nad. Cg@ provided. Pt amb back to room, urine sent for testing.

## 2011-10-10 ENCOUNTER — Other Ambulatory Visit: Payer: Self-pay | Admitting: Family Medicine

## 2011-10-10 DIAGNOSIS — Z139 Encounter for screening, unspecified: Secondary | ICD-10-CM

## 2011-11-20 ENCOUNTER — Ambulatory Visit
Admission: RE | Admit: 2011-11-20 | Discharge: 2011-11-20 | Disposition: A | Discharge status: Home / Self Care | Payer: 59 | Source: Ambulatory Visit | Attending: Family Medicine | Admitting: Family Medicine

## 2011-11-20 DIAGNOSIS — Z139 Encounter for screening, unspecified: Secondary | ICD-10-CM

## 2012-09-29 ENCOUNTER — Other Ambulatory Visit (HOSPITAL_COMMUNITY)
Admission: RE | Admit: 2012-09-29 | Discharge: 2012-09-29 | Disposition: A | Discharge status: Home / Self Care | Payer: 59 | Source: Ambulatory Visit | Attending: Obstetrics and Gynecology | Admitting: Obstetrics and Gynecology

## 2012-09-29 ENCOUNTER — Other Ambulatory Visit: Payer: Self-pay | Admitting: Obstetrics and Gynecology

## 2012-09-29 DIAGNOSIS — Z01419 Encounter for gynecological examination (general) (routine) without abnormal findings: Secondary | ICD-10-CM | POA: Insufficient documentation

## 2012-09-29 DIAGNOSIS — Z1151 Encounter for screening for human papillomavirus (HPV): Secondary | ICD-10-CM | POA: Insufficient documentation

## 2012-10-15 ENCOUNTER — Other Ambulatory Visit: Payer: Self-pay | Admitting: Obstetrics and Gynecology

## 2012-11-13 ENCOUNTER — Other Ambulatory Visit (HOSPITAL_BASED_OUTPATIENT_CLINIC_OR_DEPARTMENT_OTHER): Payer: Self-pay | Admitting: Obstetrics and Gynecology

## 2012-11-13 DIAGNOSIS — Z139 Encounter for screening, unspecified: Secondary | ICD-10-CM

## 2012-12-08 ENCOUNTER — Ambulatory Visit (HOSPITAL_BASED_OUTPATIENT_CLINIC_OR_DEPARTMENT_OTHER)
Admission: RE | Admit: 2012-12-08 | Discharge: 2012-12-08 | Disposition: A | Discharge status: Home / Self Care | Payer: 59 | Source: Ambulatory Visit | Attending: Obstetrics and Gynecology | Admitting: Obstetrics and Gynecology

## 2012-12-08 DIAGNOSIS — Z139 Encounter for screening, unspecified: Secondary | ICD-10-CM

## 2012-12-08 DIAGNOSIS — Z1231 Encounter for screening mammogram for malignant neoplasm of breast: Secondary | ICD-10-CM | POA: Insufficient documentation

## 2012-12-09 ENCOUNTER — Inpatient Hospital Stay (HOSPITAL_BASED_OUTPATIENT_CLINIC_OR_DEPARTMENT_OTHER): Admission: RE | Admit: 2012-12-09 | Payer: 59 | Source: Ambulatory Visit

## 2013-05-07 ENCOUNTER — Other Ambulatory Visit: Payer: Self-pay | Admitting: Dermatology

## 2013-05-25 ENCOUNTER — Other Ambulatory Visit: Payer: Self-pay | Admitting: Dermatology

## 2013-06-08 ENCOUNTER — Other Ambulatory Visit: Payer: Self-pay | Admitting: Dermatology

## 2013-07-02 ENCOUNTER — Other Ambulatory Visit: Payer: Self-pay | Admitting: Dermatology

## 2013-09-21 ENCOUNTER — Encounter (HOSPITAL_BASED_OUTPATIENT_CLINIC_OR_DEPARTMENT_OTHER): Payer: Self-pay | Admitting: Emergency Medicine

## 2013-09-21 ENCOUNTER — Emergency Department (HOSPITAL_BASED_OUTPATIENT_CLINIC_OR_DEPARTMENT_OTHER)
Admission: EM | Admit: 2013-09-21 | Discharge: 2013-09-21 | Disposition: A | Discharge status: Home / Self Care | Payer: 59 | Attending: Emergency Medicine | Admitting: Emergency Medicine

## 2013-09-21 ENCOUNTER — Emergency Department (HOSPITAL_BASED_OUTPATIENT_CLINIC_OR_DEPARTMENT_OTHER): Payer: 59

## 2013-09-21 DIAGNOSIS — Z96659 Presence of unspecified artificial knee joint: Secondary | ICD-10-CM | POA: Insufficient documentation

## 2013-09-21 DIAGNOSIS — Z79899 Other long term (current) drug therapy: Secondary | ICD-10-CM | POA: Insufficient documentation

## 2013-09-21 DIAGNOSIS — M25569 Pain in unspecified knee: Secondary | ICD-10-CM | POA: Insufficient documentation

## 2013-09-21 DIAGNOSIS — J45909 Unspecified asthma, uncomplicated: Secondary | ICD-10-CM | POA: Insufficient documentation

## 2013-09-21 DIAGNOSIS — IMO0002 Reserved for concepts with insufficient information to code with codable children: Secondary | ICD-10-CM | POA: Insufficient documentation

## 2013-09-21 DIAGNOSIS — F3289 Other specified depressive episodes: Secondary | ICD-10-CM | POA: Insufficient documentation

## 2013-09-21 DIAGNOSIS — F329 Major depressive disorder, single episode, unspecified: Secondary | ICD-10-CM | POA: Insufficient documentation

## 2013-09-21 DIAGNOSIS — M25562 Pain in left knee: Secondary | ICD-10-CM

## 2013-09-21 DIAGNOSIS — Z8739 Personal history of other diseases of the musculoskeletal system and connective tissue: Secondary | ICD-10-CM | POA: Insufficient documentation

## 2013-09-21 MED ORDER — OXYCODONE-ACETAMINOPHEN 5-325 MG PO TABS: 2.0000 | ORAL_TABLET | Freq: Once | ORAL | Status: DC

## 2013-09-21 MED ORDER — OXYCODONE-ACETAMINOPHEN 5-325 MG PO TABS: 1.0000 | ORAL_TABLET | ORAL | Status: DC | PRN

## 2013-09-21 NOTE — ED Provider Notes (Addendum)
This chart was scribed for Layla Maw Ward, DO by Dorothey Baseman, ED Scribe. This patient was seen in room MH03/MH03 and the patient's care was started at 7:17 PM.  CHIEF COMPLAINT: knee pain  HPI:  HPI Comments: Tara [name] is a 53 y.o. Female with a history of arthritis (in hands, per patient) who presents to the Emergency Department complaining of a constant pain to the left knee that she states has been ongoing for the past one month, but began progressively worsening yesterday. She states that the pain is exacerbated with walking/bearing weight. Patient denies any potential injury or trauma to the area. Patient reports taking Advil and arthritis medication at home without significant relief. She states that she has an appointment to follow up with an orthopedist in 4 days. Patient has an allergy to codeine - itching only. 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 Patient has a history of depression and asthma.    ROS: See HPI Constitutional: no fever  Eyes: no drainage  ENT: no runny nose   Cardiovascular:  no chest pain  Resp: no SOB  GI: no vomiting GU: no dysuria Integumentary: no rash  Allergy: no hives  Musculoskeletal: arthralgias, no leg swelling  Neurological: no slurred speech ROS otherwise negative  PAST MEDICAL HISTORY/PAST SURGICAL HISTORY:  Past Medical History  Diagnosis Date  . Depression   . Asthma     MEDICATIONS:  Prior to Admission medications   Medication Sig Start Date End Date Taking? Authorizing Provider  amphetamine-dextroamphetamine (ADDERALL) 15 MG tablet Take 15 mg by mouth 2 (two) times daily.   Yes Historical Provider, MD  DULoxetine (CYMBALTA) 30 MG capsule Take 30 mg by mouth daily.   Yes Historical Provider, MD  mometasone (NASONEX) 50 MCG/ACT nasal spray Place 2 sprays into the nose daily.    Historical Provider, MD    ALLERGIES:  Allergies  Allergen Reactions  . Codeine     SOCIAL HISTORY:  History  Substance Use  Topics  . Smoking status: Never Smoker   . Smokeless tobacco: Not on file  . Alcohol Use: No    FAMILY HISTORY: History reviewed. No pertinent family history.  EXAM: Triage Vitals: BP 146/57  Pulse 88  Temp(Src) 98.4 F (36.9 C) (Oral)  Resp 18  Ht 5\' 7"  (1.702 m)  Wt 180 lb (81.647 kg)  BMI 28.19 kg/m2  SpO2 100%  CONSTITUTIONAL: Alert and oriented and responds appropriately to questions. Well-appearing; well-nourished HEAD: Normocephalic EYES: Conjunctivae clear, PERRL ENT: normal nose; no rhinorrhea; moist mucous membranes; pharynx without lesions noted NECK: Supple, no meningismus, no LAD  CARD: RRR; S1 and S2 appreciated; no murmurs, no clicks, no rubs, no gallops; 2+ DP pulses bilaterally RESP: Normal chest excursion without splinting or tachypnea; breath sounds clear and equal bilaterally; no wheezes, no rhonchi, no rales,  ABD/GI: Normal bowel sounds; non-distended; soft, non-tender, no rebound, no guarding BACK:  The back appears normal and is non-tender to palpation, there is no CVA tenderness EXT: Normal ROM in all joints; extreme tenderness to palpation to medial joint line of left patella, small joint effusion, no laxity, no erythema, no warmth; no edema; normal capillary refill; no cyanosis    SKIN: Normal color for age and race; warm NEURO: Moves all extremities equally, normal sensation to light touch PSYCH: The patient's mood and manner are appropriate. Grooming and personal hygiene are appropriate.  MEDICAL DECISION MAKING: Independently reviewed x-ray results. No acute abnormality. Likely meniscal injury.  No  signs of septic arthritis or ligamentous laxity. Will discharge patient with medication to manage symptoms. Advised her to follow up with orthopedist.   ED PROGRESS:  7:20 PM- Discussed that x-ray results were normal. Will discharge patient with Percocet to manage symptoms until patient can be seen by the orthopedist. Discussed that an MRI may be able to  better delineate the cause of her pain, but that this is not done in the ED because there is no cause for emergent concern at this time. Advised of further symptomatic care. Discussed treatment plan with patient at bedside and patient verbalized agreement.   Dg Knee Complete 4 Views Left  09/21/2013   CLINICAL DATA:  Progressive left anterior lateral knee pain.  EXAM: LEFT KNEE - COMPLETE 4+ VIEW  COMPARISON:  None.  FINDINGS: There is no fracture or dislocation or joint effusion. There is slight chondrocalcinosis.  IMPRESSION: No acute abnormality.  Chondrocalcinosis.   Electronically Signed   By: Geanie CooleyJim  Maxwell M.D.   On: 09/21/2013 18:41       I personally performed the services described in this documentation, which was scribed in my presence. The recorded information has been reviewed and is accurate.   Layla MawKristen N Ward, DO 09/21/13 1946  Layla MawKristen N Ward, DO 09/21/13 1947

## 2013-09-21 NOTE — Discharge Instructions (Signed)
Knee Pain Knee pain can be a result of an injury or other medical conditions. Treatment will depend on the cause of your pain. HOME CARE  Only take medicine as told by your doctor.  Keep a healthy weight. Being overweight can make the knee hurt more.  Stretch before exercising or playing sports.  If there is constant knee pain, change the way you exercise. Ask your doctor for advice.  Make sure shoes fit well. Choose the right shoe for the sport or activity.  Protect your knees. Wear kneepads if needed.  Rest when you are tired. GET HELP RIGHT AWAY IF:   Your knee pain does not stop.  Your knee pain does not get better.  Your knee joint feels hot to the touch.  You have a fever. MAKE SURE YOU:   Understand these instructions.  Will watch this condition.  Will get help right away if you are not doing well or get worse. Document Released: 09/28/2008 Document Revised: 09/24/2011 Document Reviewed: 09/28/2008 [facility] Patient Information 2014 [place]. Knee, Cartilage (Meniscus) Injury It is suspected that you have a torn cartilage (meniscus) in your knee. The menisci are made of tough cartilage, and fit between the surfaces of the thigh and leg bones. The menisci are "C"shaped and have a wedged profile. The wedged profile helps the stability of the joint by keeping the rounded femur surface from sliding off the flat tibial surface. The menisci are fed (nourished) by small blood vessels; but there is also a large area at the inner edge of the meniscus that does not have a good blood supply (avascular). This presents a problem when there is an injury to the meniscus, because areas without good blood supply heal poorly. As a result when there is a torn cartilage in the knee, surgery is often required to fix it. This is usually done with a surgical procedure less invasive than open surgery (arthroscopy). Some times open surgery of the knee is required if there is other  damage. PURPOSE OF THE MENISCUS The medial meniscus rests on the medial tibial plateau. The tibia is the large bone in your lower leg (the shin bone). The medial tibial plateau is the upper end of the bone making up the inner part of your knee. The lateral meniscus serves the same purpose and is located on the outside of the knee. The menisci help to distribute your body weight across the knee joint; they act as shock absorbers. Without the meniscus present, the weight of your body would be unevenly applied to the bones in your legs (the femur and tibia). The femur is the large bone in your thigh. This uneven weight distribution would cause increased wear and tear on the cartilage lining the joint surfaces, leading to early damage (arthritis) of these areas. The presence of the menisci cartilage is necessary for a healthy knee. PURPOSE OF THE KNEE CARTILAGE The knee joint is made up of three bones: the thigh bone (femur), the shin bone (tibia), and the knee cap (patella). The surfaces of these bones at the knee joint are covered with cartilage called articular cartilage. This smooth slippery surface allows the bones to slide against each other without causing bone damage. The meniscus sits between these cartilaginous surfaces of the bones. It distributes the weight evenly in the joints and helps with the stability of the joint (keeps the joint steady). HOME CARE INSTRUCTIONS  Use crutches and external braces as instructed.  Once home an ice pack applied to  your injured knee may help with discomfort and keep the swelling down. An ice pack can be used for the first couple of days or as instructed.  Only take over-the-counter or prescription medicines for pain, discomfort, or fever as directed by your caregiver.  Call if you do not have relief of pain with medications or if there is increasing in pain.  Call if your foot becomes cold or blue.  You may resume normal diet and activities as  directed.  Make sure to keep your appointment with your follow-up caregiver. This injury may require further evaluation and treatment beyond the temporary treatment given today. Document Released: 09/22/2002 Document Revised: 09/24/2011 Document Reviewed: 01/14/2009 [facility] Patient Information 2014 [place], [place].

## 2013-09-21 NOTE — ED Notes (Signed)
Pt reports (L) knee pain x 1 month.  Denies injury.  Pt has orthopedic appointment on Friday.

## 2013-09-21 NOTE — ED Notes (Signed)
C/o left knee pain x 1 month, worse last 24 hours,  Denies inj,  No deformity noted

## 2013-09-30 ENCOUNTER — Other Ambulatory Visit (HOSPITAL_COMMUNITY)
Admission: RE | Admit: 2013-09-30 | Discharge: 2013-09-30 | Disposition: A | Discharge status: Home / Self Care | Payer: 59 | Source: Ambulatory Visit | Attending: Obstetrics and Gynecology | Admitting: Obstetrics and Gynecology

## 2013-09-30 ENCOUNTER — Other Ambulatory Visit: Payer: Self-pay | Admitting: Obstetrics and Gynecology

## 2013-09-30 DIAGNOSIS — Z01419 Encounter for gynecological examination (general) (routine) without abnormal findings: Secondary | ICD-10-CM | POA: Insufficient documentation

## 2013-11-06 ENCOUNTER — Other Ambulatory Visit: Payer: Self-pay | Admitting: Dermatology

## 2013-11-30 ENCOUNTER — Other Ambulatory Visit (HOSPITAL_BASED_OUTPATIENT_CLINIC_OR_DEPARTMENT_OTHER): Payer: Self-pay | Admitting: Obstetrics and Gynecology

## 2013-11-30 DIAGNOSIS — Z1231 Encounter for screening mammogram for malignant neoplasm of breast: Secondary | ICD-10-CM

## 2014-01-01 ENCOUNTER — Ambulatory Visit (HOSPITAL_BASED_OUTPATIENT_CLINIC_OR_DEPARTMENT_OTHER)
Admission: RE | Admit: 2014-01-01 | Discharge: 2014-01-01 | Disposition: A | Discharge status: Home / Self Care | Payer: 59 | Source: Ambulatory Visit | Attending: Obstetrics and Gynecology | Admitting: Obstetrics and Gynecology

## 2014-01-01 DIAGNOSIS — Z1231 Encounter for screening mammogram for malignant neoplasm of breast: Secondary | ICD-10-CM

## 2014-04-17 ENCOUNTER — Encounter (HOSPITAL_BASED_OUTPATIENT_CLINIC_OR_DEPARTMENT_OTHER): Payer: Self-pay | Admitting: Emergency Medicine

## 2014-04-17 ENCOUNTER — Emergency Department (HOSPITAL_BASED_OUTPATIENT_CLINIC_OR_DEPARTMENT_OTHER): Payer: 59

## 2014-04-17 ENCOUNTER — Emergency Department (HOSPITAL_BASED_OUTPATIENT_CLINIC_OR_DEPARTMENT_OTHER)
Admission: EM | Admit: 2014-04-17 | Discharge: 2014-04-17 | Disposition: A | Discharge status: Home / Self Care | Payer: 59 | Attending: Emergency Medicine | Admitting: Emergency Medicine

## 2014-04-17 DIAGNOSIS — Z79899 Other long term (current) drug therapy: Secondary | ICD-10-CM | POA: Insufficient documentation

## 2014-04-17 DIAGNOSIS — K5732 Diverticulitis of large intestine without perforation or abscess without bleeding: Secondary | ICD-10-CM

## 2014-04-17 DIAGNOSIS — Z79891 Long term (current) use of opiate analgesic: Secondary | ICD-10-CM | POA: Diagnosis not present

## 2014-04-17 DIAGNOSIS — Z9889 Other specified postprocedural states: Secondary | ICD-10-CM | POA: Insufficient documentation

## 2014-04-17 DIAGNOSIS — F329 Major depressive disorder, single episode, unspecified: Secondary | ICD-10-CM | POA: Diagnosis not present

## 2014-04-17 DIAGNOSIS — R109 Unspecified abdominal pain: Secondary | ICD-10-CM | POA: Diagnosis present

## 2014-04-17 DIAGNOSIS — J45909 Unspecified asthma, uncomplicated: Secondary | ICD-10-CM | POA: Insufficient documentation

## 2014-04-17 HISTORY — DX: Diverticulitis of intestine, part unspecified, without perforation or abscess without bleeding: K57.92

## 2014-04-17 LAB — BASIC METABOLIC PANEL
ANION GAP: 13 (ref 5–15)
BUN: 14 mg/dL (ref 6–23)
CHLORIDE: 105 meq/L (ref 96–112)
CO2: 23 meq/L (ref 19–32)
CREATININE: 0.8 mg/dL (ref 0.50–1.10)
Calcium: 8.9 mg/dL (ref 8.4–10.5)
GFR calc Af Amer: >90 mL/min (ref >90)
GFR calc non Af Amer: 82 mL/min — ABNORMAL LOW (ref >90)
Glucose, Bld: 119 mg/dL — ABNORMAL HIGH (ref 70–99)
Potassium: 4 mEq/L (ref 3.7–5.3)
Sodium: 141 mEq/L (ref 137–147)

## 2014-04-17 LAB — URINE MICROSCOPIC-ADD ON

## 2014-04-17 LAB — CBC WITH DIFFERENTIAL/PLATELET
Basophils Absolute: 0 10*3/uL (ref 0.0–0.1)
Basophils Relative: 0 % (ref 0–1)
Eosinophils Absolute: 0.2 10*3/uL (ref 0.0–0.7)
Eosinophils Relative: 2 % (ref 0–5)
HEMATOCRIT: 37 % (ref 36.0–46.0)
HEMOGLOBIN: 12.2 g/dL (ref 12.0–15.0)
LYMPHS PCT: 14 % (ref 12–46)
Lymphs Abs: 1.6 10*3/uL (ref 0.7–4.0)
MCH: 30 pg (ref 26.0–34.0)
MCHC: 33 g/dL (ref 30.0–36.0)
MCV: 90.9 fL (ref 78.0–100.0)
MONO ABS: 0.8 10*3/uL (ref 0.1–1.0)
MONOS PCT: 7 % (ref 3–12)
Neutro Abs: 8.5 10*3/uL — ABNORMAL HIGH (ref 1.7–7.7)
Neutrophils Relative %: 77 % (ref 43–77)
Platelets: 261 10*3/uL (ref 150–400)
RBC: 4.07 MIL/uL (ref 3.87–5.11)
RDW: 12.9 % (ref 11.5–15.5)
WBC: 11.1 10*3/uL — AB (ref 4.0–10.5)

## 2014-04-17 LAB — URINALYSIS, ROUTINE W REFLEX MICROSCOPIC
Bilirubin Urine: NEGATIVE
GLUCOSE, UA: NEGATIVE mg/dL
HGB URINE DIPSTICK: NEGATIVE
KETONES UR: NEGATIVE mg/dL
Nitrite: NEGATIVE
PROTEIN: NEGATIVE mg/dL
Specific Gravity, Urine: 1.02 (ref 1.005–1.030)
Urobilinogen, UA: 1 mg/dL (ref 0.0–1.0)
pH: 6.5 (ref 5.0–8.0)

## 2014-04-17 MED ORDER — SODIUM CHLORIDE 0.9 % IV SOLN
INTRAVENOUS | Status: DC
  Administered 2014-04-17: 05:00:00 via INTRAVENOUS

## 2014-04-17 MED ORDER — METRONIDAZOLE IN NACL 5-0.79 MG/ML-% IV SOLN
500.0000 mg | Freq: Once | INTRAVENOUS | Status: AC
  Administered 2014-04-17: 500 mg via INTRAVENOUS
  Filled 2014-04-17: qty 100

## 2014-04-17 MED ORDER — METRONIDAZOLE 500 MG PO TABS: 500.0000 mg | ORAL_TABLET | Freq: Three times a day (TID) | ORAL | Status: DC

## 2014-04-17 MED ORDER — IOHEXOL 300 MG/ML  SOLN
100.0000 mL | Freq: Once | INTRAMUSCULAR | Status: AC | PRN
  Administered 2014-04-17: 100 mL via INTRAVENOUS

## 2014-04-17 MED ORDER — IOHEXOL 300 MG/ML  SOLN
50.0000 mL | Freq: Once | INTRAMUSCULAR | Status: AC | PRN
  Administered 2014-04-17: 50 mL via ORAL

## 2014-04-17 MED ORDER — CIPROFLOXACIN HCL 500 MG PO TABS: 500.0000 mg | ORAL_TABLET | Freq: Two times a day (BID) | ORAL | Status: DC

## 2014-04-17 MED ORDER — OXYCODONE-ACETAMINOPHEN 5-325 MG PO TABS: 1.0000 | ORAL_TABLET | Freq: Four times a day (QID) | ORAL | Status: DC | PRN

## 2014-04-17 MED ORDER — FENTANYL CITRATE 0.05 MG/ML IJ SOLN
100.0000 ug | Freq: Once | INTRAMUSCULAR | Status: AC
  Administered 2014-04-17: 100 ug via INTRAVENOUS
  Filled 2014-04-17: qty 2

## 2014-04-17 MED ORDER — ONDANSETRON HCL 4 MG/2ML IJ SOLN
4.0000 mg | Freq: Once | INTRAMUSCULAR | Status: AC
  Administered 2014-04-17: 4 mg via INTRAVENOUS
  Filled 2014-04-17: qty 2

## 2014-04-17 MED ORDER — CIPROFLOXACIN HCL 500 MG PO TABS
500.0000 mg | ORAL_TABLET | Freq: Once | ORAL | Status: AC
  Administered 2014-04-17: 500 mg via ORAL
  Filled 2014-04-17: qty 1

## 2014-04-17 NOTE — Discharge Instructions (Signed)

## 2014-04-17 NOTE — ED Notes (Signed)
Pt returned from CT °

## 2014-04-17 NOTE — ED Notes (Signed)
Pt states that starting yesterday she started having LLQ pain while driving home from work.  Pt states she has had fever off and on for about 1 month.

## 2014-04-17 NOTE — ED Provider Notes (Signed)
CSN: [id number]     Arrival date & time 04/17/14  [id number] History   None    Chief Complaint  Patient presents with  . Abdominal Pain     (Consider location/radiation/quality/duration/timing/severity/associated sxs/prior Treatment) HPI This is a 53 year old female with a one-day history of general malaise, chills and low-grade fever. She treated the fever with over-the-counter Aleve. About 5:30 PM yesterday she developed left lower quadrant abdominal pain which has worsened. The pain is now rated as an 8/10. It is worse with movement or palpation. She describes the pain as aching. Her last bowel movement was about 4 days ago. She usually moves her bowels daily which consists of a formed stool followed by diarrhea. She is nauseated, which she attributes to the pain, but has not vomited.  Past Medical History  Diagnosis Date  . Depression   . Asthma   . Diverticulitis    Past Surgical History  Procedure Laterality Date  . Knee surgery Right   . Appendectomy     History reviewed. No pertinent family history. History  Substance Use Topics  . Smoking status: Never Smoker   . Smokeless tobacco: Not on file  . Alcohol Use: No   OB History   Grav Para Term Preterm Abortions TAB SAB Ect Mult Living                 Review of Systems  All other systems reviewed and are negative.   Allergies  Codeine  Home Medications   Prior to Admission medications   Medication Sig Start Date End Date Taking? Authorizing Provider  mometasone-formoterol (DULERA) 100-5 MCG/ACT AERO Inhale 2 puffs into the lungs 2 (two) times daily.   Yes Historical Provider, MD  amphetamine-dextroamphetamine (ADDERALL) 15 MG tablet Take 15 mg by mouth 2 (two) times daily.    Historical Provider, MD  DULoxetine (CYMBALTA) 30 MG capsule Take 30 mg by mouth daily.    Historical Provider, MD  mometasone (NASONEX) 50 MCG/ACT nasal spray Place 2 sprays into the nose daily.    Historical Provider, MD   oxyCODONE-acetaminophen (PERCOCET/ROXICET) 5-325 MG per tablet Take 1-2 tablets by mouth every 4 (four) hours as needed. 09/21/13   Kristen N Ward, DO   BP 126/68  Pulse 86  Temp(Src) 98.8 F (37.1 C) (Oral)  Resp 20  Ht 5\' 7"  (1.702 m)  Wt 185 lb (83.915 kg)  BMI 28.97 kg/m2  SpO2 99%  Physical Exam General: Well-developed, well-nourished female in no acute distress; appearance consistent with age of record HENT: normocephalic; atraumatic Eyes: pupils equal, round and reactive to light; extraocular muscles intact Neck: supple Heart: regular rate and rhythm Lungs: clear to auscultation bilaterally Abdomen: soft; nondistended; left lower quadrant tenderness; no masses or hepatosplenomegaly; bowel sounds present Extremities: No deformity; full range of motion; pulses normal Neurologic: Awake, alert and oriented; motor function intact in all extremities and symmetric; no facial droop Skin: Warm and dry Psychiatric: Flat affect    ED Course  Procedures (including critical care time)  MDM  Nursing notes and vitals signs, including pulse oximetry, reviewed.  Summary of this visit's results, reviewed by myself:  Labs:  Results for orders placed during the hospital encounter of 04/17/14 (from the past 24 hour(s))  URINALYSIS, ROUTINE W REFLEX MICROSCOPIC     Status: Abnormal   Collection Time    04/17/14  4:05 AM      Result Value Ref Range   Color, Urine YELLOW  YELLOW   APPearance CLEAR  CLEAR  Specific Gravity, Urine 1.020  1.005 - 1.030   pH 6.5  5.0 - 8.0   Glucose, UA NEGATIVE  NEGATIVE mg/dL   Hgb urine dipstick NEGATIVE  NEGATIVE   Bilirubin Urine NEGATIVE  NEGATIVE   Ketones, ur NEGATIVE  NEGATIVE mg/dL   Protein, ur NEGATIVE  NEGATIVE mg/dL   Urobilinogen, UA 1.0  0.0 - 1.0 mg/dL   Nitrite NEGATIVE  NEGATIVE   Leukocytes, UA SMALL (*) NEGATIVE  URINE MICROSCOPIC-ADD ON     Status: Abnormal   Collection Time    04/17/14  4:05 AM      Result Value Ref Range    Squamous Epithelial / LPF FEW (*) RARE   WBC, UA 3-6  <3 WBC/hpf   RBC / HPF 0-2  <3 RBC/hpf   Bacteria, UA FEW (*) RARE  CBC WITH DIFFERENTIAL     Status: Abnormal   Collection Time    04/17/14  4:40 AM      Result Value Ref Range   WBC 11.1 (*) 4.0 - 10.5 K/uL   RBC 4.07  3.87 - 5.11 MIL/uL   Hemoglobin 12.2  12.0 - 15.0 g/dL   HCT 16.1  09.6 - 04.5 %   MCV 90.9  78.0 - 100.0 fL   MCH 30.0  26.0 - 34.0 pg   MCHC 33.0  30.0 - 36.0 g/dL   RDW 40.9  81.1 - 91.4 %   Platelets 261  150 - 400 K/uL   Neutrophils Relative % 77  43 - 77 %   Neutro Abs 8.5 (*) 1.7 - 7.7 K/uL   Lymphocytes Relative 14  12 - 46 %   Lymphs Abs 1.6  0.7 - 4.0 K/uL   Monocytes Relative 7  3 - 12 %   Monocytes Absolute 0.8  0.1 - 1.0 K/uL   Eosinophils Relative 2  0 - 5 %   Eosinophils Absolute 0.2  0.0 - 0.7 K/uL   Basophils Relative 0  0 - 1 %   Basophils Absolute 0.0  0.0 - 0.1 K/uL  BASIC METABOLIC PANEL     Status: Abnormal   Collection Time    04/17/14  4:40 AM      Result Value Ref Range   Sodium 141  137 - 147 mEq/L   Potassium 4.0  3.7 - 5.3 mEq/L   Chloride 105  96 - 112 mEq/L   CO2 23  19 - 32 mEq/L   Glucose, Bld 119 (*) 70 - 99 mg/dL   BUN 14  6 - 23 mg/dL   Creatinine, Ser 7.82  0.50 - 1.10 mg/dL   Calcium 8.9  8.4 - 95.6 mg/dL   GFR calc non Af Amer 82 (*) >90 mL/min   GFR calc Af Amer >90  >90 mL/min   Anion gap 13  5 - 15    Imaging Studies: Ct Abdomen Pelvis W Contrast  04/17/2014   CLINICAL DATA:  Initial evaluation of left lower quadrant pain, onset yesterday, off on fever for 1 month. History of appendectomy and diverticulitis.  EXAM: CT ABDOMEN AND PELVIS WITH CONTRAST  TECHNIQUE: Multidetector CT imaging of the abdomen and pelvis was performed using the standard protocol following bolus administration of intravenous contrast.  CONTRAST:  50mL OMNIPAQUE IOHEXOL 300 MG/ML SOLN, OMNIPAQUE IOHEXOL 300 MG/ML SOLN  COMPARISON:  Prior CT from 12/05/2009  FINDINGS: Mild  subsegmental atelectasis seen dependently within the visualized lung bases.  Gallbladder within normal limits. No biliary dilatation. Spleen, adrenal  glands, and pancreas demonstrate a normal contrast enhanced appearance.  Kidneys are equal size with symmetric enhancement. No nephrolithiasis, hydronephrosis, or focal enhancing renal mass.  Inflammation and fat stranding about several diverticula within the left lower quadrant, compatible with acute diverticulitis. No evidence of perforation or diverticular abscess. No other acute inflammatory changes seen about the bowels. Appendix not visualized, compatible with prior appendectomy.  Bladder within normal limits.  Uterus and ovaries are unremarkable.  No free air or fluid. No pathologically enlarged intra-abdominal pelvic lymph nodes. Normal intravascular enhancement seen throughout the abdomen and pelvis.  No acute osseous abnormality. No worrisome lytic or blastic osseous lesions.  IMPRESSION: Findings consistent with acute diverticulitis involving the descending colon. No evidence of perforation or other complication.   Electronically Signed   By: Rise Mu M.D.   On: 04/17/2014 06:10       Hanley Seamen, MD 04/17/14 [id number]

## 2014-04-17 NOTE — ED Notes (Signed)
MD at bedside. 

## 2014-04-26 ENCOUNTER — Emergency Department (HOSPITAL_COMMUNITY)
Admission: EM | Admit: 2014-04-26 | Discharge: 2014-04-27 | Disposition: A | Discharge status: Home / Self Care | Payer: 59 | Attending: Emergency Medicine | Admitting: Emergency Medicine

## 2014-04-26 ENCOUNTER — Encounter (HOSPITAL_COMMUNITY): Payer: Self-pay | Admitting: Emergency Medicine

## 2014-04-26 ENCOUNTER — Emergency Department (HOSPITAL_COMMUNITY): Payer: 59

## 2014-04-26 DIAGNOSIS — Z9089 Acquired absence of other organs: Secondary | ICD-10-CM | POA: Insufficient documentation

## 2014-04-26 DIAGNOSIS — Z7951 Long term (current) use of inhaled steroids: Secondary | ICD-10-CM | POA: Insufficient documentation

## 2014-04-26 DIAGNOSIS — J45909 Unspecified asthma, uncomplicated: Secondary | ICD-10-CM | POA: Insufficient documentation

## 2014-04-26 DIAGNOSIS — Z79899 Other long term (current) drug therapy: Secondary | ICD-10-CM | POA: Insufficient documentation

## 2014-04-26 DIAGNOSIS — R110 Nausea: Secondary | ICD-10-CM | POA: Diagnosis not present

## 2014-04-26 DIAGNOSIS — R1032 Left lower quadrant pain: Secondary | ICD-10-CM | POA: Diagnosis present

## 2014-04-26 DIAGNOSIS — Z8719 Personal history of other diseases of the digestive system: Secondary | ICD-10-CM | POA: Insufficient documentation

## 2014-04-26 DIAGNOSIS — Z792 Long term (current) use of antibiotics: Secondary | ICD-10-CM | POA: Diagnosis not present

## 2014-04-26 DIAGNOSIS — F329 Major depressive disorder, single episode, unspecified: Secondary | ICD-10-CM | POA: Diagnosis not present

## 2014-04-26 LAB — COMPREHENSIVE METABOLIC PANEL
ALBUMIN: 3.6 g/dL (ref 3.5–5.2)
ALT: 26 U/L (ref 0–35)
AST: 30 U/L (ref 0–37)
Alkaline Phosphatase: 74 U/L (ref 39–117)
Anion gap: 10 (ref 5–15)
BUN: 13 mg/dL (ref 6–23)
CO2: 25 mEq/L (ref 19–32)
Calcium: 9.2 mg/dL (ref 8.4–10.5)
Chloride: 106 mEq/L (ref 96–112)
Creatinine, Ser: 0.94 mg/dL (ref 0.50–1.10)
GFR calc Af Amer: 78 mL/min — ABNORMAL LOW (ref >90)
GFR, EST NON AFRICAN AMERICAN: 68 mL/min — AB (ref >90)
Glucose, Bld: 99 mg/dL (ref 70–99)
Potassium: 4.6 mEq/L (ref 3.7–5.3)
Sodium: 141 mEq/L (ref 137–147)
Total Bilirubin: 0.3 mg/dL (ref 0.3–1.2)
Total Protein: 7.5 g/dL (ref 6.0–8.3)

## 2014-04-26 LAB — URINALYSIS, ROUTINE W REFLEX MICROSCOPIC
Bilirubin Urine: NEGATIVE
Glucose, UA: NEGATIVE mg/dL
Hgb urine dipstick: NEGATIVE
KETONES UR: NEGATIVE mg/dL
Nitrite: NEGATIVE
PH: 5 (ref 5.0–8.0)
PROTEIN: NEGATIVE mg/dL
Specific Gravity, Urine: 1.022 (ref 1.005–1.030)
UROBILINOGEN UA: 0.2 mg/dL (ref 0.0–1.0)

## 2014-04-26 LAB — CBC WITH DIFFERENTIAL/PLATELET
BASOS PCT: 1 % (ref 0–1)
Basophils Absolute: 0 10*3/uL (ref 0.0–0.1)
Eosinophils Absolute: 0.2 10*3/uL (ref 0.0–0.7)
Eosinophils Relative: 2 % (ref 0–5)
HEMATOCRIT: 39.5 % (ref 36.0–46.0)
HEMOGLOBIN: 12.8 g/dL (ref 12.0–15.0)
Lymphocytes Relative: 31 % (ref 12–46)
Lymphs Abs: 2.2 10*3/uL (ref 0.7–4.0)
MCH: 28.9 pg (ref 26.0–34.0)
MCHC: 32.4 g/dL (ref 30.0–36.0)
MCV: 89.2 fL (ref 78.0–100.0)
MONO ABS: 0.5 10*3/uL (ref 0.1–1.0)
Monocytes Relative: 7 % (ref 3–12)
NEUTROS ABS: 4.1 10*3/uL (ref 1.7–7.7)
Neutrophils Relative %: 59 % (ref 43–77)
Platelets: 388 10*3/uL (ref 150–400)
RBC: 4.43 MIL/uL (ref 3.87–5.11)
RDW: 13.3 % (ref 11.5–15.5)
WBC: 7 10*3/uL (ref 4.0–10.5)

## 2014-04-26 LAB — URINE MICROSCOPIC-ADD ON

## 2014-04-26 MED ORDER — MORPHINE SULFATE 4 MG/ML IJ SOLN
4.0000 mg | Freq: Once | INTRAMUSCULAR | Status: AC
  Administered 2014-04-26: 4 mg via INTRAVENOUS
  Filled 2014-04-26: qty 1

## 2014-04-26 MED ORDER — FLUCONAZOLE 150 MG PO TABS
150.0000 mg | ORAL_TABLET | Freq: Once | ORAL | Status: AC
  Administered 2014-04-26: 150 mg via ORAL
  Filled 2014-04-26: qty 1

## 2014-04-26 MED ORDER — DICYCLOMINE HCL 10 MG/ML IM SOLN
20.0000 mg | Freq: Once | INTRAMUSCULAR | Status: AC
  Administered 2014-04-26: 20 mg via INTRAMUSCULAR
  Filled 2014-04-26: qty 2

## 2014-04-26 MED ORDER — ONDANSETRON HCL 4 MG/2ML IJ SOLN
4.0000 mg | Freq: Once | INTRAMUSCULAR | Status: AC
  Administered 2014-04-26: 4 mg via INTRAVENOUS
  Filled 2014-04-26: qty 2

## 2014-04-26 MED ORDER — IOHEXOL 300 MG/ML  SOLN
100.0000 mL | Freq: Once | INTRAMUSCULAR | Status: AC | PRN
  Administered 2014-04-26: 100 mL via INTRAVENOUS

## 2014-04-26 MED ORDER — SODIUM CHLORIDE 0.9 % IV BOLUS (SEPSIS)
1000.0000 mL | Freq: Once | INTRAVENOUS | Status: AC
  Administered 2014-04-26: 1000 mL via INTRAVENOUS

## 2014-04-26 MED ORDER — IOHEXOL 300 MG/ML  SOLN
50.0000 mL | Freq: Once | INTRAMUSCULAR | Status: AC | PRN
  Administered 2014-04-26: 50 mL via ORAL

## 2014-04-26 NOTE — ED Notes (Addendum)
Pt was [facility] for diverticulitis 10/3 put on antibiotics, sx got better. Pain started coming back on Thursday, states pain progressed over the weekend. Pt c/o nausea no vomiting or diarrhea. Pt also states she thinks she has a yeast infection due to itching.

## 2014-04-26 NOTE — ED Provider Notes (Signed)
CSN: [id number]     Arrival date & time 04/26/14  1617 History   First MD Initiated Contact with Patient 04/26/14 2109     Chief Complaint  Patient presents with  . Diverticulitis     (Consider location/radiation/quality/duration/timing/severity/associated sxs/prior Treatment) HPI Comments: Patient is a 53 year old female with history of depression, asthma, diverticulitis who presents to the emergency department today for evaluation of worsening left lower quadrant abdominal pain. She reports the pain is throbbing. She was initially diagnosed with diverticulitis on 04/17/14. She has been taking Cipro and Flagyl as prescribed. Her symptoms initially improved, but worsened over the weekend. She went to see her primary care physician today and was referred to the emergency department. She denies fevers, vomiting. She does have some associated nausea. Additionally, she complains of vaginal itching. She is concerned she is getting a yeast infection given the antibiotics that she has been on.  The history is provided by the patient. No language interpreter was used.    Past Medical History  Diagnosis Date  . Depression   . Asthma   . Diverticulitis    Past Surgical History  Procedure Laterality Date  . Knee surgery Right   . Appendectomy     No family history on file. History  Substance Use Topics  . Smoking status: Never Smoker   . Smokeless tobacco: Not on file  . Alcohol Use: No   OB History   Grav Para Term Preterm Abortions TAB SAB Ect Mult Living                 Review of Systems  Constitutional: Negative for fever and chills.  Respiratory: Negative for shortness of breath.   Cardiovascular: Negative for chest pain.  Gastrointestinal: Positive for nausea and abdominal pain. Negative for vomiting.  All other systems reviewed and are negative.     Allergies  Codeine  Home Medications   Prior to Admission medications   Medication Sig Start Date End Date Taking?  Authorizing Provider  amphetamine-dextroamphetamine (ADDERALL) 15 MG tablet Take 15 mg by mouth 2 (two) times daily.    Historical Provider, MD  ciprofloxacin (CIPRO) 500 MG tablet Take 1 tablet (500 mg total) by mouth 2 (two) times daily. 04/17/14   John L Molpus, MD  DULoxetine (CYMBALTA) 30 MG capsule Take 30 mg by mouth daily.    Historical Provider, MD  metroNIDAZOLE (FLAGYL) 500 MG tablet Take 1 tablet (500 mg total) by mouth 3 (three) times daily. 04/17/14   John L Molpus, MD  mometasone (NASONEX) 50 MCG/ACT nasal spray Place 2 sprays into the nose daily.    Historical Provider, MD  mometasone-formoterol (DULERA) 100-5 MCG/ACT AERO Inhale 2 puffs into the lungs 2 (two) times daily.    Historical Provider, MD  oxyCODONE-acetaminophen (PERCOCET/ROXICET) 5-325 MG per tablet Take 1-2 tablets by mouth every 6 (six) hours as needed (for pain). 04/17/14   John L Molpus, MD   BP 141/81  Pulse 65  Temp(Src) 98 F (36.7 C) (Oral)  Resp 18  SpO2 100% Physical Exam  Nursing note and vitals reviewed. Constitutional: She is oriented to person, place, and time. She appears well-developed and well-nourished. No distress.  HENT:  Head: Normocephalic and atraumatic.  Right Ear: External ear normal.  Left Ear: External ear normal.  Nose: Nose normal.  Mouth/Throat: Oropharynx is clear and moist.  Eyes: Conjunctivae are normal.  Neck: Normal range of motion.  Cardiovascular: Normal rate, regular rhythm and normal heart sounds.  Pulmonary/Chest: Effort normal and breath sounds normal. No stridor. No respiratory distress. She has no wheezes. She has no rales.  Abdominal: Soft. She exhibits no distension. There is tenderness in the left lower quadrant.  Musculoskeletal: Normal range of motion.  Neurological: She is alert and oriented to person, place, and time. She has normal strength.  Skin: Skin is warm and dry. She is not diaphoretic. No erythema.  Psychiatric: She has a normal mood and affect. Her  behavior is normal.    ED Course  Procedures (including critical care time) Labs Review Labs Reviewed  COMPREHENSIVE METABOLIC PANEL - Abnormal; Notable for the following:    GFR calc non Af Amer 68 (*)    GFR calc Af Amer 78 (*)    All other components within normal limits  URINALYSIS, ROUTINE W REFLEX MICROSCOPIC - Abnormal; Notable for the following:    Color, Urine AMBER (*)    APPearance CLOUDY (*)    Leukocytes, UA MODERATE (*)    All other components within normal limits  URINE MICROSCOPIC-ADD ON - Abnormal; Notable for the following:    Bacteria, UA FEW (*)    All other components within normal limits  CBC WITH DIFFERENTIAL    Imaging Review Ct Abdomen Pelvis W Contrast  04/26/2014   CLINICAL DATA:  Diverticulitis. Evaluate for perforation or abscess. Subsequent encounter  EXAM: CT ABDOMEN AND PELVIS WITH CONTRAST  TECHNIQUE: Multidetector CT imaging of the abdomen and pelvis was performed using the standard protocol following bolus administration of intravenous contrast.  CONTRAST:  50mL OMNIPAQUE IOHEXOL 300 MG/ML SOLN, 100mL OMNIPAQUE IOHEXOL 300 MG/ML SOLN  COMPARISON:  04/17/2014  FINDINGS: BODY WALL: Unremarkable.  LOWER CHEST: Unremarkable.  ABDOMEN/PELVIS:  Liver: No focal abnormality.  Biliary: Distended but no inflammatory change. Probable gallstones, also noted 07/04/2009.  Pancreas: Unremarkable.  Spleen: Unremarkable.  Adrenals: Unremarkable.  Kidneys and ureters: No hydronephrosis or stone.  Bladder: Unremarkable.  Reproductive: Unremarkable.  Bowel: Inflammation along the descending colon mesentery has decreased, consistent with resolving diverticulitis. There is no evidence of abscess or free perforation. No bowel obstruction. Diverticulosis is extensive, present throughout the colon. Appendectomy.  Retroperitoneum: No mass or adenopathy.  Peritoneum: No ascites or pneumoperitoneum.  Vascular: No acute abnormality.  OSSEOUS: No acute abnormalities.  IMPRESSION: Near  complete resolution of descending colon diverticulitis. No abscess or perforation.   Electronically Signed   By: Tiburcio PeaJonathan  Watts M.D.   On: 04/26/2014 22:59     EKG Interpretation None      MDM   Final diagnoses:  Left lower quadrant pain    Patient presents emergency department for evaluation of left lower quadrant abdominal pain. History of known diverticulitis diagnosed on CT scan. This has been compliant with her antibiotics. Given increased pain will repeat CT.  CT shows near complete resolution of descending colon diverticulitis. No abscess or perforation. Patient given morphine and Bentyl in the emergency department. Patient feeling improved. Patient also complains of vaginal itching. Patient does not want pelvic exam. Will treat with Diflucan for possible yeast infection given recent antibiotics. Patient will followup with primary care physician as needed. Discussed reasons to return to emergency Department immediately. Vital signs stable for discharge. Discussed case with Dr. Blinda LeatherwoodPollina who agrees with plan. Patient / Family / Caregiver informed of clinical course, understand medical decision-making process, and agree with plan.      Mora BellmanHannah S Becci Batty, PA-C 04/27/14 [phone]

## 2014-04-27 MED ORDER — DICYCLOMINE HCL 20 MG PO TABS: 20.0000 mg | ORAL_TABLET | Freq: Two times a day (BID) | ORAL | Status: DC

## 2014-04-29 NOTE — ED Provider Notes (Signed)
Medical screening examination/treatment/procedure(s) were performed by non-physician practitioner and as supervising physician I was immediately available for consultation/collaboration.   EKG Interpretation None        Gilda Creasehristopher J. Pollina, MD 04/29/14 [phone]

## 2014-07-30 ENCOUNTER — Other Ambulatory Visit: Payer: Self-pay | Admitting: Dermatology

## 2014-08-21 ENCOUNTER — Emergency Department (HOSPITAL_COMMUNITY)
Admission: EM | Admit: 2014-08-21 | Discharge: 2014-08-21 | Disposition: A | Discharge status: Home / Self Care | Payer: 59 | Attending: Emergency Medicine | Admitting: Emergency Medicine

## 2014-08-21 ENCOUNTER — Encounter (HOSPITAL_COMMUNITY): Payer: Self-pay | Admitting: Emergency Medicine

## 2014-08-21 ENCOUNTER — Emergency Department (HOSPITAL_COMMUNITY): Payer: 59

## 2014-08-21 DIAGNOSIS — Z7951 Long term (current) use of inhaled steroids: Secondary | ICD-10-CM | POA: Diagnosis not present

## 2014-08-21 DIAGNOSIS — Z792 Long term (current) use of antibiotics: Secondary | ICD-10-CM | POA: Insufficient documentation

## 2014-08-21 DIAGNOSIS — R079 Chest pain, unspecified: Secondary | ICD-10-CM | POA: Diagnosis not present

## 2014-08-21 DIAGNOSIS — J45909 Unspecified asthma, uncomplicated: Secondary | ICD-10-CM | POA: Insufficient documentation

## 2014-08-21 DIAGNOSIS — Z8719 Personal history of other diseases of the digestive system: Secondary | ICD-10-CM | POA: Diagnosis not present

## 2014-08-21 DIAGNOSIS — F329 Major depressive disorder, single episode, unspecified: Secondary | ICD-10-CM | POA: Insufficient documentation

## 2014-08-21 DIAGNOSIS — Z79899 Other long term (current) drug therapy: Secondary | ICD-10-CM | POA: Insufficient documentation

## 2014-08-21 DIAGNOSIS — R0602 Shortness of breath: Secondary | ICD-10-CM | POA: Diagnosis present

## 2014-08-21 LAB — I-STAT TROPONIN, ED
Troponin i, poc: 0 ng/mL (ref 0.00–0.08)
Troponin i, poc: 0 ng/mL (ref 0.00–0.08)

## 2014-08-21 LAB — BASIC METABOLIC PANEL
Anion gap: 8 (ref 5–15)
BUN: 12 mg/dL (ref 6–23)
CHLORIDE: 108 mmol/L (ref 96–112)
CO2: 25 mmol/L (ref 19–32)
Calcium: 9.4 mg/dL (ref 8.4–10.5)
Creatinine, Ser: 0.86 mg/dL (ref 0.50–1.10)
GFR calc Af Amer: 87 mL/min — ABNORMAL LOW (ref >90)
GFR calc non Af Amer: 75 mL/min — ABNORMAL LOW (ref >90)
GLUCOSE: 103 mg/dL — AB (ref 70–99)
Potassium: 3.8 mmol/L (ref 3.5–5.1)
SODIUM: 141 mmol/L (ref 135–145)

## 2014-08-21 LAB — CBC
HCT: 42.1 % (ref 36.0–46.0)
Hemoglobin: 13.8 g/dL (ref 12.0–15.0)
MCH: 29.2 pg (ref 26.0–34.0)
MCHC: 32.8 g/dL (ref 30.0–36.0)
MCV: 89.2 fL (ref 78.0–100.0)
Platelets: 293 10*3/uL (ref 150–400)
RBC: 4.72 MIL/uL (ref 3.87–5.11)
RDW: 12.9 % (ref 11.5–15.5)
WBC: 7.4 10*3/uL (ref 4.0–10.5)

## 2014-08-21 LAB — D-DIMER, QUANTITATIVE (NOT AT ARMC)

## 2014-08-21 MED ORDER — NITROGLYCERIN 0.4 MG SL SUBL: 0.4000 mg | SUBLINGUAL_TABLET | SUBLINGUAL | Status: DC | PRN

## 2014-08-21 MED ORDER — LORAZEPAM 2 MG/ML IJ SOLN
1.0000 mg | Freq: Once | INTRAMUSCULAR | Status: AC
  Administered 2014-08-21: 1 mg via INTRAVENOUS
  Filled 2014-08-21: qty 1

## 2014-08-21 MED ORDER — IPRATROPIUM-ALBUTEROL 0.5-2.5 (3) MG/3ML IN SOLN
3.0000 mL | Freq: Once | RESPIRATORY_TRACT | Status: AC
  Administered 2014-08-21: 3 mL via RESPIRATORY_TRACT
  Filled 2014-08-21: qty 3

## 2014-08-21 MED ORDER — MORPHINE SULFATE 4 MG/ML IJ SOLN
4.0000 mg | Freq: Once | INTRAMUSCULAR | Status: AC
  Administered 2014-08-21: 4 mg via INTRAVENOUS
  Filled 2014-08-21: qty 1

## 2014-08-21 MED ORDER — METHYLPREDNISOLONE SODIUM SUCC 125 MG IJ SOLR
125.0000 mg | Freq: Once | INTRAMUSCULAR | Status: AC
Start: 2014-08-21 — End: 2014-08-21
  Administered 2014-08-21: 125 mg via INTRAVENOUS
  Filled 2014-08-21: qty 2

## 2014-08-21 MED ORDER — PREDNISONE 20 MG PO TABS: ORAL_TABLET | ORAL | Status: DC

## 2014-08-21 NOTE — Discharge Instructions (Signed)

## 2014-08-21 NOTE — ED Provider Notes (Signed)
CSN: [id number]     Arrival date & time 08/21/14  1727 History   First MD Initiated Contact with Patient 08/21/14 1728     Chief Complaint  Patient presents with  . Shortness of Breath  . Chest Pain     (Consider location/radiation/quality/duration/timing/severity/associated sxs/prior Treatment) Patient is a 54 y.o. female presenting with shortness of breath and chest pain. The history is provided by the patient.  Shortness of Breath Severity:  Moderate Onset quality:  Gradual Timing:  Constant Progression:  Unchanged Chronicity:  New Context: not URI   Context comment:  Stress at home, not getting along with her husband Relieved by: nitroglycerin. Worsened by:  Nothing tried Associated symptoms: chest pain   Associated symptoms: no cough, no fever, no hemoptysis, no rash, no syncope and no vomiting   Chest Pain Associated symptoms: shortness of breath   Associated symptoms: no cough, no fever, no syncope and not vomiting     Past Medical History  Diagnosis Date  . Depression   . Asthma   . Diverticulitis    Past Surgical History  Procedure Laterality Date  . Knee surgery Right   . Appendectomy     History reviewed. No pertinent family history. History  Substance Use Topics  . Smoking status: Never Smoker   . Smokeless tobacco: Not on file  . Alcohol Use: No   OB History    No data available     Review of Systems  Constitutional: Negative for fever.  Respiratory: Positive for shortness of breath. Negative for cough and hemoptysis.   Cardiovascular: Positive for chest pain. Negative for leg swelling and syncope.  Gastrointestinal: Negative for vomiting.  Skin: Negative for rash.  All other systems reviewed and are negative.     Allergies  Codeine  Home Medications   Prior to Admission medications   Medication Sig Start Date End Date Taking? Authorizing Provider  acidophilus (RISAQUAD) CAPS capsule Take 1 capsule by mouth daily.    Historical  Provider, MD  amphetamine-dextroamphetamine (ADDERALL) 15 MG tablet Take 15 mg by mouth 2 (two) times daily.    Historical Provider, MD  ciprofloxacin (CIPRO) 500 MG tablet Take 1 tablet (500 mg total) by mouth 2 (two) times daily. 04/17/14   John L Molpus, MD  dicyclomine (BENTYL) 20 MG tablet Take 1 tablet (20 mg total) by mouth 2 (two) times daily. 04/27/14   Mora Bellman, PA-C  docusate sodium (COLACE) 100 MG capsule Take 100 mg by mouth daily.    Historical Provider, MD  DULoxetine (CYMBALTA) 30 MG capsule Take 30 mg by mouth daily.    Historical Provider, MD  metroNIDAZOLE (FLAGYL) 500 MG tablet Take 1 tablet (500 mg total) by mouth 3 (three) times daily. 04/17/14   John L Molpus, MD  mometasone (NASONEX) 50 MCG/ACT nasal spray Place 2 sprays into the nose daily.    Historical Provider, MD  mometasone-formoterol (DULERA) 100-5 MCG/ACT AERO Inhale 2 puffs into the lungs 2 (two) times daily.    Historical Provider, MD   BP 130/68 mmHg  Pulse 81  Temp(Src) 98 F (36.7 C) (Oral)  Resp 12  Ht  (1.702 m)  Wt 185 lb (83.915 kg)  BMI 28.97 kg/m2  SpO2 100% Physical Exam  Constitutional: She is oriented to person, place, and time. She appears well-developed and well-nourished. No distress.  HENT:  Head: Normocephalic and atraumatic.  Mouth/Throat: Oropharynx is clear and moist.  Eyes: EOM are normal. Pupils are equal, round, and reactive  to light.  Neck: Normal range of motion. Neck supple.  Cardiovascular: Normal rate and regular rhythm.  Exam reveals no friction rub.   No murmur heard. Pulmonary/Chest: Effort normal and breath sounds normal. No respiratory distress. She has no wheezes. She has no rales.  Abdominal: Soft. She exhibits no distension. There is no tenderness. There is no rebound.  Musculoskeletal: Normal range of motion. She exhibits no edema.  Neurological: She is alert and oriented to person, place, and time.  Skin: No rash noted. She is not diaphoretic.  Nursing  note and vitals reviewed.   ED Course  Procedures (including critical care time) Labs Review Labs Reviewed  CBC  BASIC METABOLIC PANEL  D-DIMER, QUANTITATIVE  I-STAT TROPOININ, ED    Imaging Review Dg Chest 2 View  08/21/2014   CLINICAL DATA:  Chest pain radiating to the left side of the neck and shoulder. Shortness of breath. Weakness.  EXAM: CHEST  2 VIEW  COMPARISON:  09/27/2011.  FINDINGS: Trachea is midline. Heart size normal. Lungs are clear. No pleural fluid.  IMPRESSION: No acute findings.   Electronically Signed   By: Leanna BattlesMelinda  Blietz M.D.   On: 08/21/2014 18:18     EKG Interpretation None       Date: 08/21/2014  Rate: 80  Rhythm: normal sinus rhythm  QRS Axis: normal  Intervals: normal  ST/T Wave abnormalities: normal  Conduction Disutrbances:none  Narrative Interpretation:   Old EKG Reviewed: none available    MDM   Final diagnoses:  Chest pain    35F here with shortness of breath and chest tightness. Began today while at the mall. Is having some stress at home, is fighting with her husband. Was sitting in her car, began having SOB, feeling like she can't get a full breath. No chest pain, but continues to state chest tightness.  EKG ok. Feeling better after some steroids and a duoneb. Serial troponins normal. Given ativan for concern of anxiety. Doubt ACS, could be mild asthma exacerbation. With normal troponins, normal EKG, unlikely to be heart related. Negative d-dimer. Stable for discharge.    Elwin MochaBlair Parke Jandreau, MD 08/21/14 2225

## 2014-08-21 NOTE — ED Notes (Signed)
Pt ambulated well in hallway. 

## 2014-08-21 NOTE — ED Notes (Signed)
SOB this morning; used inhaler, did not improve. Stressful at home, so she went to mall parking lot. Sitting in car, reading and SOB worsened. [facility] Given 324mg  of ASA. EMS gave nitro x2, no change. Feels SOB and a chest tightness.

## 2014-08-21 NOTE — ED Notes (Signed)
Pt made aware to return if symptoms worsen or if any life threatening symptoms occur.   

## 2014-08-21 NOTE — ED Notes (Signed)
Patient transported to X-ray 

## 2014-08-21 NOTE — ED Notes (Signed)
Pt was given a sandwich and water per Dr. Gwendolyn GrantWalden.

## 2014-08-21 NOTE — ED Notes (Signed)
Patient returned from X-ray 

## 2014-10-06 ENCOUNTER — Other Ambulatory Visit: Payer: Self-pay | Admitting: Obstetrics and Gynecology

## 2014-10-06 ENCOUNTER — Other Ambulatory Visit (HOSPITAL_COMMUNITY)
Admission: RE | Admit: 2014-10-06 | Discharge: 2014-10-06 | Disposition: A | Discharge status: Home / Self Care | Payer: 59 | Source: Ambulatory Visit | Attending: Obstetrics and Gynecology | Admitting: Obstetrics and Gynecology

## 2014-10-06 DIAGNOSIS — Z01411 Encounter for gynecological examination (general) (routine) with abnormal findings: Secondary | ICD-10-CM | POA: Diagnosis present

## 2014-10-07 LAB — CYTOLOGY - PAP

## 2014-12-06 ENCOUNTER — Other Ambulatory Visit (HOSPITAL_BASED_OUTPATIENT_CLINIC_OR_DEPARTMENT_OTHER): Payer: Self-pay | Admitting: Obstetrics and Gynecology

## 2014-12-06 DIAGNOSIS — Z1231 Encounter for screening mammogram for malignant neoplasm of breast: Secondary | ICD-10-CM

## 2015-01-04 ENCOUNTER — Ambulatory Visit (HOSPITAL_BASED_OUTPATIENT_CLINIC_OR_DEPARTMENT_OTHER)
Admission: RE | Admit: 2015-01-04 | Discharge: 2015-01-04 | Disposition: A | Discharge status: Home / Self Care | Payer: 59 | Source: Ambulatory Visit | Attending: Obstetrics and Gynecology | Admitting: Obstetrics and Gynecology

## 2015-01-04 DIAGNOSIS — Z1231 Encounter for screening mammogram for malignant neoplasm of breast: Secondary | ICD-10-CM | POA: Diagnosis present

## 2015-03-15 DIAGNOSIS — G5603 Carpal tunnel syndrome, bilateral upper limbs: Secondary | ICD-10-CM | POA: Insufficient documentation

## 2015-07-12 ENCOUNTER — Emergency Department (HOSPITAL_COMMUNITY): Payer: 59

## 2015-07-12 ENCOUNTER — Encounter (HOSPITAL_COMMUNITY): Payer: Self-pay | Admitting: Emergency Medicine

## 2015-07-12 ENCOUNTER — Emergency Department (HOSPITAL_COMMUNITY)
Admission: EM | Admit: 2015-07-12 | Discharge: 2015-07-12 | Disposition: A | Discharge status: Home / Self Care | Payer: 59 | Attending: Emergency Medicine | Admitting: Emergency Medicine

## 2015-07-12 DIAGNOSIS — Y9389 Activity, other specified: Secondary | ICD-10-CM | POA: Diagnosis not present

## 2015-07-12 DIAGNOSIS — Y92810 Car as the place of occurrence of the external cause: Secondary | ICD-10-CM | POA: Insufficient documentation

## 2015-07-12 DIAGNOSIS — Z8659 Personal history of other mental and behavioral disorders: Secondary | ICD-10-CM | POA: Diagnosis not present

## 2015-07-12 DIAGNOSIS — Z8719 Personal history of other diseases of the digestive system: Secondary | ICD-10-CM | POA: Insufficient documentation

## 2015-07-12 DIAGNOSIS — Z7951 Long term (current) use of inhaled steroids: Secondary | ICD-10-CM | POA: Diagnosis not present

## 2015-07-12 DIAGNOSIS — Y998 Other external cause status: Secondary | ICD-10-CM | POA: Insufficient documentation

## 2015-07-12 DIAGNOSIS — J45909 Unspecified asthma, uncomplicated: Secondary | ICD-10-CM | POA: Insufficient documentation

## 2015-07-12 DIAGNOSIS — S0512XA Contusion of eyeball and orbital tissues, left eye, initial encounter: Secondary | ICD-10-CM

## 2015-07-12 DIAGNOSIS — S01112A Laceration without foreign body of left eyelid and periocular area, initial encounter: Secondary | ICD-10-CM

## 2015-07-12 DIAGNOSIS — S0993XA Unspecified injury of face, initial encounter: Secondary | ICD-10-CM | POA: Diagnosis present

## 2015-07-12 DIAGNOSIS — W228XXA Striking against or struck by other objects, initial encounter: Secondary | ICD-10-CM | POA: Diagnosis not present

## 2015-07-12 DIAGNOSIS — Z79899 Other long term (current) drug therapy: Secondary | ICD-10-CM | POA: Insufficient documentation

## 2015-07-12 MED ORDER — IBUPROFEN 800 MG PO TABS
800.0000 mg | ORAL_TABLET | Freq: Once | ORAL | Status: AC
  Administered 2015-07-12: 800 mg via ORAL
  Filled 2015-07-12: qty 1

## 2015-07-12 MED ORDER — ACETAMINOPHEN 500 MG PO TABS
500.0000 mg | ORAL_TABLET | Freq: Once | ORAL | Status: AC
  Administered 2015-07-12: 500 mg via ORAL
  Filled 2015-07-12: qty 1

## 2015-07-12 NOTE — ED Provider Notes (Signed)
CSN: [id number]     Arrival date & time 07/12/15  1147 History  By signing my name below, I, Tara [name], attest that this documentation has been prepared under the direction and in the presence of Ivery QualeHobson Shye Doty, PA-C Electronically Signed: Charline BillsEssence [name], ED Scribe 07/12/2015 at 4:22 PM.   Chief Complaint  Patient presents with  . Head Laceration   Patient is a 54 y.o. female presenting with scalp laceration. The history is provided by the patient. No language interpreter was used.  Head Laceration This is a new problem. The current episode started 3 to 5 hours ago. Nothing aggravates the symptoms. Nothing relieves the symptoms. She has tried nothing for the symptoms.   HPI Comments: Tara PointerJill [name] is a 54 y.o. female who presents to the Emergency Department complaining of a head laceration sustained this morning. Pt sates that she was leaving the dentist when she unintentionally struck herself in the left eyebrow with her car door. Pt states that she "blacked out" for a brief moment but denies LOC or falling. Bleeding is controlled. She denies anticoagulants or antiplatelet use. No h/o bleeding disorders. No facial or brain operations. Last tetanus was approximately 1 year ago.    Past Medical History  Diagnosis Date  . Depression   . Asthma   . Diverticulitis    Past Surgical History  Procedure Laterality Date  . Knee surgery Right   . Appendectomy     History reviewed. No pertinent family history. Social History  Substance Use Topics  . Smoking status: Never Smoker   . Smokeless tobacco: None  . Alcohol Use: No   OB History    No data available     Review of Systems  Skin: Positive for wound.  Hematological: Does not bruise/bleed easily.  All other systems reviewed and are negative.  Allergies  Codeine  Home Medications   Prior to Admission medications   Medication Sig Start Date End Date Taking? Authorizing Provider  amphetamine-dextroamphetamine (ADDERALL) 15  MG tablet Take 15 mg by mouth 2 (two) times daily.   Yes Historical Provider, MD  mometasone-formoterol (DULERA) 100-5 MCG/ACT AERO Inhale 2 puffs into the lungs 2 (two) times daily.   Yes Historical Provider, MD  dicyclomine (BENTYL) 20 MG tablet Take 1 tablet (20 mg total) by mouth 2 (two) times daily. Patient not taking: Reported on 08/21/2014 04/27/14   Junious SilkHannah Merrell, PA-C   BP 141/71 mmHg  Pulse 65  Temp(Src) 97.8 F (36.6 C) (Oral)  Resp 16  SpO2 100%  LMP 08/30/2010 Physical Exam  Constitutional: She is oriented to person, place, and time. She appears well-developed and well-nourished. No distress.  HENT:  Head: Normocephalic and atraumatic.  1.2 cm laceration of the L eyebrow. Tenderness to palpation of the L orbit.   Eyes: Conjunctivae and EOM are normal.  Neck: Neck supple. No tracheal deviation present.  Cardiovascular: Normal rate.   Pulmonary/Chest: Effort normal. No respiratory distress.  Musculoskeletal: Normal range of motion.  Neurological: She is alert and oriented to person, place, and time.  Skin: Skin is warm and dry.  Psychiatric: She has a normal mood and affect. Her behavior is normal.  Nursing note and vitals reviewed.  ED Course  .Marland Kitchen.Laceration Repair Date/Time: 07/12/2015 4:44 PM Performed by: Ivery QualeBRYANT, Johnsie Moscoso Authorized by: Ivery QualeBRYANT, Jaaron Oleson Consent: Verbal consent obtained. Risks and benefits: risks, benefits and alternatives were discussed Consent given by: patient Patient understanding: patient states understanding of the procedure being performed Patient identity confirmed: arm band Time out:  Immediately prior to procedure a "time out" was called to verify the correct patient, procedure, equipment, support staff and site/side marked as required. Body area: head/neck Location details: left eyebrow Laceration length: 1.2 cm Foreign bodies: no foreign bodies Patient sedated: no Preparation: Patient was prepped and draped in the usual sterile  fashion. Irrigation solution: saline Amount of cleaning: standard Skin closure: glue Approximation: close Approximation difficulty: simple Patient tolerance: Patient tolerated the procedure well with no immediate complications   (including critical care time) DIAGNOSTIC STUDIES: Oxygen Saturation is 100% on Ra, normal by my interpretation.    COORDINATION OF CARE: 3:06 PM-Discussed treatment plan which includes Tylenol, ibuprofen, XR and dermabond with pt at bedside and pt agreed to plan.   3:45 PM-Repaired with dermabond. Pt tolerated the procedure.  Labs Review Labs Reviewed - No data to display  Imaging Review Dg Orbits  07/12/2015  CLINICAL DATA:  Struck in LEFT eyebrow opening a car door this morning, laceration, pain EXAM: ORBITS - COMPLETE 3 VIEWS COMPARISON:  None FINDINGS: Osseous mineralization normal. Minimal nasal septal deviation to the LEFT. Paranasal sinuses clear. No definite facial bone fractures identified. IMPRESSION: No acute osseous abnormalities. Electronically Signed   By: Ulyses Southward M.D.   On: 07/12/2015 16:04   I have personally reviewed and evaluated these images and lab results as part of my medical decision-making.   EKG Interpretation None      MDM  X-ray of the orbits is negative for fracture or dislocation. The wound to the left eyebrow was repaired with Dermabond without problem. Discussed the use of Dermabond, and need to return if signs of advancing infection. Patient is in agreement with this discharge plan.    Final diagnoses:  None    *I have reviewed nursing notes, vital signs, and all appropriate lab and imaging results for this patient.**  **I personally performed the services described in this documentation, which was scribed in my presence. The recorded information has been reviewed and is accurate.Ivery Quale, PA-C 07/13/15 1122  Bethann Berkshire, MD 07/14/15 [phone]

## 2015-07-12 NOTE — Discharge Instructions (Signed)
Your wound was repaired with Dermabond. This will come off on its own in 7-10 days, please do not pull on. Please see your primary physician or return to the emergency department if any red streaking, pus like drainage, or signs of advancing infection. The x-ray of your orbits were is negative for fracture or dislocation. Stitches, Staples, or Adhesive Wound Closure Health care providers use stitches (sutures), staples, and certain glue (skin adhesives) to hold skin together while it heals (wound closure). You may need this treatment after you have surgery or if you cut your skin accidentally. These methods help your skin to heal more quickly and make it less likely that you will have a scar. A wound may take several months to heal completely. The type of wound you have determines when your wound gets closed. In most cases, the wound is closed as soon as possible (primary skin closure). Sometimes, closure is delayed so the wound can be cleaned and allowed to heal naturally. This reduces the chance of infection. Delayed closure may be needed if your wound:  Is caused by a bite.  Happened more than 6 hours ago.  Involves loss of skin or the tissues under the skin.  Has dirt or debris in it that cannot be removed.  Is infected. WHAT ARE THE DIFFERENT KINDS OF WOUND CLOSURES? There are many options for wound closure. The one that your health care provider uses depends on how deep and how large your wound is. Adhesive Glue To use this type of glue to close a wound, your health care provider holds the edges of the wound together and paints the glue on the surface of your skin. You may need more than one layer of glue. Then the wound may be covered with a light bandage (dressing). This type of skin closure may be used for small wounds that are not deep (superficial). Using glue for wound closure is less painful than other methods. It does not require a medicine that numbs the area (local anesthetic). This  method also leaves nothing to be removed. Adhesive glue is often used for children and on facial wounds. Adhesive glue cannot be used for wounds that are deep, uneven, or bleeding. It is not used inside of a wound.  Adhesive Strips These strips are made of sticky (adhesive), porous paper. They are applied across your skin edges like a regular adhesive bandage. You leave them on until they fall off. Adhesive strips may be used to close very superficial wounds. They may also be used along with sutures to improve the closure of your skin edges.  Sutures Sutures are the oldest method of wound closure. Sutures can be made from natural substances, such as silk, or from synthetic materials, such as nylon and steel. They can be made from a material that your body can break down as your wound heals (absorbable), or they can be made from a material that needs to be removed from your skin (nonabsorbable). They come in many different strengths and sizes. Your health care provider attaches the sutures to a steel needle on one end. Sutures can be passed through your skin, or through the tissues beneath your skin. Then they are tied and cut. Your skin edges may be closed in one continuous stitch or in separate stitches. Sutures are strong and can be used for all kinds of wounds. Absorbable sutures may be used to close tissues under the skin. The disadvantage of sutures is that they may cause skin reactions that  lead to infection. Nonabsorbable sutures need to be removed. Staples When surgical staples are used to close a wound, the edges of your skin on both sides of the wound are brought close together. A staple is placed across the wound, and an instrument secures the edges together. Staples are often used to close surgical cuts (incisions). Staples are faster to use than sutures, and they cause less skin reaction. Staples need to be removed using a tool that bends the staples away from your skin. HOW DO I CARE FOR MY  WOUND CLOSURE?  Take medicines only as directed by your health care provider.  If you were prescribed an antibiotic medicine for your wound, finish it all even if you start to feel better.  Use ointments or creams only as directed by your health care provider.  Wash your hands with soap and water before and after touching your wound.  Do not soak your wound in water. Do not take baths, swim, or use a hot tub until your health care provider approves.  Ask your health care provider when you can start showering. Cover your wound if directed by your health care provider.  Do not take out your own sutures or staples.  Do not pick at your wound. Picking can cause an infection.  Keep all follow-up visits as directed by your health care provider. This is important. HOW LONG WILL I HAVE MY WOUND CLOSURE?  Leave adhesive glue on your skin until the glue peels away.  Leave adhesive strips on your skin until the strips fall off.  Absorbable sutures will dissolve within several days.  Nonabsorbable sutures and staples must be removed. The location of the wound will determine how long they stay in. This can range from several days to a couple of weeks. WHEN SHOULD I SEEK HELP FOR MY WOUND CLOSURE? Contact your health care provider if:  You have a fever.  You have chills.  You have drainage, redness, swelling, or pain at your wound.  There is a bad smell coming from your wound.  The skin edges of your wound start to separate after your sutures have been removed.  Your wound becomes thick, raised, and darker in color after your sutures come out (scarring).   This information is not intended to replace advice given to you by your health care provider. Make sure you discuss any questions you have with your health care provider.   Document Released: 03/27/2001 Document Revised: 07/23/2014 Document Reviewed: 12/09/2013 Elsevier Interactive Patient Education [employer]

## 2015-07-12 NOTE — ED Notes (Signed)
Pt clipped her L eyebrow when opening her car door this am. Pt has a 1cm laceration on L eyebrow. No LOC. Not on blood thinners.

## 2015-09-29 ENCOUNTER — Emergency Department (HOSPITAL_COMMUNITY)
Admission: EM | Admit: 2015-09-29 | Discharge: 2015-09-30 | Disposition: A | Discharge status: Other Acute Inpatient Hospital | Payer: 59 | Attending: Emergency Medicine | Admitting: Emergency Medicine

## 2015-09-29 ENCOUNTER — Encounter (HOSPITAL_COMMUNITY): Payer: Self-pay | Admitting: *Deleted

## 2015-09-29 DIAGNOSIS — Z7951 Long term (current) use of inhaled steroids: Secondary | ICD-10-CM | POA: Insufficient documentation

## 2015-09-29 DIAGNOSIS — Z79899 Other long term (current) drug therapy: Secondary | ICD-10-CM | POA: Diagnosis not present

## 2015-09-29 DIAGNOSIS — R1030 Lower abdominal pain, unspecified: Secondary | ICD-10-CM | POA: Diagnosis not present

## 2015-09-29 DIAGNOSIS — F909 Attention-deficit hyperactivity disorder, unspecified type: Secondary | ICD-10-CM | POA: Insufficient documentation

## 2015-09-29 DIAGNOSIS — T43622A Poisoning by amphetamines, intentional self-harm, initial encounter: Secondary | ICD-10-CM | POA: Diagnosis present

## 2015-09-29 DIAGNOSIS — T50902A Poisoning by unspecified drugs, medicaments and biological substances, intentional self-harm, initial encounter: Secondary | ICD-10-CM

## 2015-09-29 DIAGNOSIS — R45851 Suicidal ideations: Secondary | ICD-10-CM

## 2015-09-29 DIAGNOSIS — Z8719 Personal history of other diseases of the digestive system: Secondary | ICD-10-CM | POA: Diagnosis not present

## 2015-09-29 DIAGNOSIS — Y9389 Activity, other specified: Secondary | ICD-10-CM | POA: Diagnosis not present

## 2015-09-29 DIAGNOSIS — J45909 Unspecified asthma, uncomplicated: Secondary | ICD-10-CM | POA: Diagnosis not present

## 2015-09-29 DIAGNOSIS — F1510 Other stimulant abuse, uncomplicated: Secondary | ICD-10-CM | POA: Diagnosis not present

## 2015-09-29 DIAGNOSIS — T39392A Poisoning by other nonsteroidal anti-inflammatory drugs [NSAID], intentional self-harm, initial encounter: Secondary | ICD-10-CM | POA: Insufficient documentation

## 2015-09-29 DIAGNOSIS — Y9289 Other specified places as the place of occurrence of the external cause: Secondary | ICD-10-CM | POA: Insufficient documentation

## 2015-09-29 DIAGNOSIS — T38892A Poisoning by other hormones and synthetic substitutes, intentional self-harm, initial encounter: Secondary | ICD-10-CM | POA: Insufficient documentation

## 2015-09-29 DIAGNOSIS — Y998 Other external cause status: Secondary | ICD-10-CM | POA: Diagnosis not present

## 2015-09-29 HISTORY — DX: Other specified behavioral and emotional disorders with onset usually occurring in childhood and adolescence: F98.8

## 2015-09-29 LAB — CBC
HEMATOCRIT: 41.2 % (ref 36.0–46.0)
HEMOGLOBIN: 13.9 g/dL (ref 12.0–15.0)
MCH: 29.7 pg (ref 26.0–34.0)
MCHC: 33.7 g/dL (ref 30.0–36.0)
MCV: 88 fL (ref 78.0–100.0)
PLATELETS: 281 10*3/uL (ref 150–400)
RBC: 4.68 MIL/uL (ref 3.87–5.11)
RDW: 12.8 % (ref 11.5–15.5)
WBC: 8.8 10*3/uL (ref 4.0–10.5)

## 2015-09-29 LAB — COMPREHENSIVE METABOLIC PANEL
ALBUMIN: 3.9 g/dL (ref 3.5–5.0)
ALK PHOS: 67 U/L (ref 38–126)
ALT: 20 U/L (ref 14–54)
AST: 26 U/L (ref 15–41)
Anion gap: 10 (ref 5–15)
BUN: 21 mg/dL — AB (ref 6–20)
CALCIUM: 9.3 mg/dL (ref 8.9–10.3)
CO2: 22 mmol/L (ref 22–32)
CREATININE: 0.98 mg/dL (ref 0.44–1.00)
Chloride: 107 mmol/L (ref 101–111)
GFR calc Af Amer: >60 mL/min (ref >60)
GFR calc non Af Amer: >60 mL/min (ref >60)
GLUCOSE: 101 mg/dL — AB (ref 65–99)
Potassium: 3.9 mmol/L (ref 3.5–5.1)
SODIUM: 139 mmol/L (ref 135–145)
Total Bilirubin: 0.4 mg/dL (ref 0.3–1.2)
Total Protein: 7.4 g/dL (ref 6.5–8.1)

## 2015-09-29 LAB — ETHANOL: Alcohol, Ethyl (B): <5 mg/dL (ref <5)

## 2015-09-29 LAB — CBG MONITORING, ED: Glucose-Capillary: 85 mg/dL (ref 65–99)

## 2015-09-29 LAB — SALICYLATE LEVEL: Salicylate Lvl: <4 mg/dL (ref 2.8–30.0)

## 2015-09-29 LAB — ACETAMINOPHEN LEVEL: Acetaminophen (Tylenol), Serum: <10 ug/mL — ABNORMAL LOW (ref 10–30)

## 2015-09-29 NOTE — ED Notes (Signed)
MD at bedside. 

## 2015-09-29 NOTE — ED Notes (Signed)
Kathrynn DuckingBarna Meckler-pts husband [phone].  Per spouse patient has never had any psych issues.  States that patient had an incident with one of their adult children which caused this incident.  States that he and patient are no longer together but still communicate and have  "great relationship".  States that they split up due to issues with adult children.

## 2015-09-29 NOTE — ED Provider Notes (Signed)
CSN: [id number]     Arrival date & time 09/29/15  2137 History  By signing my name below, I, Tara [name], attest that this documentation has been prepared under the direction and in the presence of Derwood KaplanAnkit Audriella Blakeley, MD. Electronically Signed: Bethel BornBritney [name], ED Scribe. 09/29/2015. 11:49 PM   Chief Complaint  Patient presents with  . Ingestion   The history is provided by the patient and the EMS personnel. No language interpreter was used.   Brought in by EMS from home, Tara [name] is a 55 y.o. female with history of depression who presents to the Emergency Department complaining of intentional ingestion approximately 3 hours ago. She took a "handful" of pills after sending a text saying "goodbye" to a relative. EMS found bottles of Adderall, Melatonin and Meloxicam around her on the floor.  Pt states that she is not sure if she took the pills with the intent to end her life. She notes that she has been under a lot of stress due to "everything" for the last few months.  Associated symptoms include central to left chest pressure. Pt denies palpitations, SOB, nausea, sweating.   Past Medical History  Diagnosis Date  . Depression   . Asthma   . Diverticulitis   . ADD (attention deficit disorder)    Past Surgical History  Procedure Laterality Date  . Knee surgery Right   . Appendectomy     History reviewed. No pertinent family history. Social History  Substance Use Topics  . Smoking status: Never Smoker   . Smokeless tobacco: None  . Alcohol Use: No   OB History    No data available     Review of Systems  10 Systems reviewed and all are negative for acute change except as noted in the HPI.  Allergies  Codeine  Home Medications   Prior to Admission medications   Medication Sig Start Date End Date Taking? Authorizing Provider  amphetamine-dextroamphetamine (ADDERALL) 15 MG tablet Take 15 mg by mouth 2 (two) times daily.   Yes Historical Provider, MD  meloxicam (MOBIC) 15  MG tablet Take 15 mg by mouth daily as needed for pain.   Yes Historical Provider, MD  mometasone (NASONEX) 50 MCG/ACT nasal spray Place 2 sprays into the nose daily as needed (congestion).    Yes Historical Provider, MD  mometasone-formoterol (DULERA) 100-5 MCG/ACT AERO Inhale 2 puffs into the lungs 2 (two) times daily.   Yes Historical Provider, MD  dicyclomine (BENTYL) 20 MG tablet Take 1 tablet (20 mg total) by mouth 2 (two) times daily. Patient not taking: Reported on 08/21/2014 04/27/14   Tara SilkHannah Merrell, PA-C   BP 149/77 mmHg  Pulse 70  Temp(Src) 97.4 F (36.3 C) (Oral)  Resp 20  SpO2 100%  LMP 08/30/2010 Physical Exam  Constitutional: She is oriented to person, place, and time. She appears well-developed and well-nourished. No distress.  HENT:  Head: Normocephalic and atraumatic.  Eyes: EOM are normal.  Pupils 3 mm, equal, and reactive to light No nystagmus  Neck: Normal range of motion.  Cardiovascular: Normal rate, regular rhythm and normal heart sounds.   Pulses equal bilaterally  Pulmonary/Chest: Effort normal and breath sounds normal.  Lungs clear to anterior auscultation   Abdominal: Soft. She exhibits no distension. There is tenderness.  Mild tenderness over the suprapubic region  Musculoskeletal: Normal range of motion.  Neurological: She is oriented to person, place, and time.  Somnolent but oriented X 3  Skin: Skin is warm and dry.  Nursing  note and vitals reviewed.   ED Course  Procedures (including critical care time) DIAGNOSTIC STUDIES: Oxygen Saturation is 100% on RA,  normal by my interpretation.    COORDINATION OF CARE: 11:41 PM Discussed treatment plan which includes lab work, observation in the ED, and TTS consult with pt at bedside and pt agreed to plan. Poison Control was consulted and they recommend that the pt be monitored for 6-8 hours for HTN, tachycardia, palpitations and confusion. They further recommend benzodiazepines for agitation and  supportive care. Recommended lab work included acetaminophen and salicylate.   Labs Review Labs Reviewed  COMPREHENSIVE METABOLIC PANEL - Abnormal; Notable for the following:    Glucose, Bld 101 (*)    BUN 21 (*)    All other components within normal limits  ACETAMINOPHEN LEVEL - Abnormal; Notable for the following:    Acetaminophen (Tylenol), Serum <10 (*)    All other components within normal limits  URINE RAPID DRUG SCREEN, HOSP PERFORMED - Abnormal; Notable for the following:    Amphetamines POSITIVE (*)    All other components within normal limits  ETHANOL  SALICYLATE LEVEL  CBC  CBG MONITORING, ED    Imaging Review No results found. I have personally reviewed and evaluated these lab results as part of my medical decision-making.   EKG Interpretation   Date/Time:  Thursday September 29 2015 21:50:01 EDT Ventricular Rate:  66 PR Interval:  157 QRS Duration: 102 QT Interval:  399 QTC Calculation: 418 R Axis:   -20 Text Interpretation:  Sinus rhythm Borderline left axis deviation Low  voltage, extremity and precordial leads normal intervals and no acute  changes Confirmed by Rhunette Croft, MD, Janey Genta [phone]) on 09/30/2015 3:22:41 AM      MDM   Final diagnoses:  Suicidal ideations  Overdose, intentional self-harm, initial encounter (HCC)    I personally performed the services described in this documentation, which was scribed in my presence. The recorded information has been reviewed and is accurate.   Pt comes in with intentional OD. She used meloxicam, melatonin and adderall. Pt has a non-focal neuro exam. Will need psych clearance. Pt is medically cleared at 7:30 am.    Derwood Kaplan, MD 10/02/15 [phone]

## 2015-09-29 NOTE — ED Notes (Signed)
Bed: RESA Expected date:  Expected time:  Means of arrival:  Comments: EMS 55 yo female from home/unresponsive/overdose

## 2015-09-29 NOTE — ED Notes (Signed)
Pt is from home.  Family found pt unresponsive when they got home with lots of pills around her.  Family received a text saying "goodbye" after which they hurried to her home to find her.  EMS found bottles of medications scattered around her.   Adderall, Melatonin and Meloxicam.  The pill bottles were not brought.  GCS 15 for ems per paramedic, pt appears to be resting on arrival  VSS for ems 140/72, 92, 99%ra cbg 100.  20g IV in right wrist

## 2015-09-30 ENCOUNTER — Encounter (HOSPITAL_COMMUNITY): Payer: Self-pay | Admitting: Emergency Medicine

## 2015-09-30 ENCOUNTER — Inpatient Hospital Stay (HOSPITAL_COMMUNITY)
Admission: AD | Admit: 2015-09-30 | Discharge: 2015-10-04 | DRG: 885 | Disposition: A | Discharge status: Home / Self Care | Payer: 59 | Source: Intra-hospital | Attending: Psychiatry | Admitting: Psychiatry

## 2015-09-30 ENCOUNTER — Encounter (HOSPITAL_COMMUNITY): Payer: Self-pay

## 2015-09-30 DIAGNOSIS — J45909 Unspecified asthma, uncomplicated: Secondary | ICD-10-CM | POA: Diagnosis present

## 2015-09-30 DIAGNOSIS — F332 Major depressive disorder, recurrent severe without psychotic features: Secondary | ICD-10-CM | POA: Diagnosis present

## 2015-09-30 DIAGNOSIS — Z818 Family history of other mental and behavioral disorders: Secondary | ICD-10-CM | POA: Diagnosis not present

## 2015-09-30 DIAGNOSIS — T43622A Poisoning by amphetamines, intentional self-harm, initial encounter: Secondary | ICD-10-CM | POA: Diagnosis not present

## 2015-09-30 DIAGNOSIS — F411 Generalized anxiety disorder: Secondary | ICD-10-CM | POA: Diagnosis present

## 2015-09-30 DIAGNOSIS — G4700 Insomnia, unspecified: Secondary | ICD-10-CM | POA: Diagnosis present

## 2015-09-30 DIAGNOSIS — Z885 Allergy status to narcotic agent status: Secondary | ICD-10-CM | POA: Diagnosis not present

## 2015-09-30 HISTORY — DX: Major depressive disorder, recurrent severe without psychotic features: F33.2

## 2015-09-30 LAB — RAPID URINE DRUG SCREEN, HOSP PERFORMED
AMPHETAMINES: POSITIVE — AB
BARBITURATES: NOT DETECTED
BENZODIAZEPINES: NOT DETECTED
Cocaine: NOT DETECTED
Opiates: NOT DETECTED
TETRAHYDROCANNABINOL: NOT DETECTED

## 2015-09-30 MED ORDER — LORAZEPAM 0.5 MG PO TABS: 0.5000 mg | ORAL_TABLET | Freq: Four times a day (QID) | ORAL | Status: DC | PRN

## 2015-09-30 MED ORDER — ACETAMINOPHEN 325 MG PO TABS
650.0000 mg | ORAL_TABLET | Freq: Once | ORAL | Status: AC
  Administered 2015-09-30: 650 mg via ORAL
  Filled 2015-09-30: qty 2

## 2015-09-30 MED ORDER — ONDANSETRON HCL 4 MG PO TABS: 4.0000 mg | ORAL_TABLET | Freq: Three times a day (TID) | ORAL | Status: DC | PRN

## 2015-09-30 MED ORDER — ZOLPIDEM TARTRATE 5 MG PO TABS: 5.0000 mg | ORAL_TABLET | Freq: Every evening | ORAL | Status: DC | PRN

## 2015-09-30 MED ORDER — ONDANSETRON 4 MG PO TBDP
4.0000 mg | ORAL_TABLET | Freq: Once | ORAL | Status: DC
  Filled 2015-09-30: qty 1

## 2015-09-30 MED ORDER — TRAZODONE HCL 50 MG PO TABS: 50.0000 mg | ORAL_TABLET | Freq: Every evening | ORAL | Status: DC | PRN

## 2015-09-30 MED ORDER — MOMETASONE FURO-FORMOTEROL FUM 100-5 MCG/ACT IN AERO
2.0000 | INHALATION_SPRAY | Freq: Two times a day (BID) | RESPIRATORY_TRACT | Status: DC
  Administered 2015-09-30 – 2015-10-04 (×9): 2 via RESPIRATORY_TRACT
  Filled 2015-09-30 (×2): qty 8.8

## 2015-09-30 MED ORDER — DULOXETINE HCL 30 MG PO CPEP
30.0000 mg | ORAL_CAPSULE | Freq: Every day | ORAL | Status: DC
  Administered 2015-09-30 – 2015-10-03 (×4): 30 mg via ORAL
  Filled 2015-09-30 (×6): qty 1

## 2015-09-30 MED ORDER — ACETAMINOPHEN 325 MG PO TABS: 650.0000 mg | ORAL_TABLET | ORAL | Status: DC | PRN

## 2015-09-30 NOTE — ED Notes (Signed)
Called Poison Control and reported the pts OD. Poison control sts based on the vitals and labs, pt case can be closed at this time that pt has met criteria with obs and parameters for release.

## 2015-09-30 NOTE — ED Notes (Signed)
Patient alert and oriented in bed.  Laying in bed, awake.  Patient requesting something to drink and states her head hurts at present.

## 2015-09-30 NOTE — [facility] Group Notes (Signed)
Adult Psychoeducational Group Note  Date:  09/30/2015 Time:  9:23 PM  Group Topic/Focus:  Wrap-Up Group:   The focus of this group is to help patients review their daily goal of treatment and discuss progress on daily workbooks.  Participation Level:  Minimal  Participation Quality:  Appropriate  Affect:  Appropriate  Cognitive:  Appropriate  Insight: Good  Engagement in Group:  Limited  Modes of Intervention:  Discussion  Additional Comments:  Pt stated her day started off rocky but improved as the day went on.  Her goal was to make sure her daughters knew her situation wasn't their fault.  She also had a visit with her daughters and hopes to discharge on Monday.  Caroll RancherLindsay, Zehra Rucci A 09/30/2015, 9:23 PM

## 2015-09-30 NOTE — BH Assessment (Addendum)
Tele Assessment Note   Milinda PointerJill Breeze is an 55 y.o. female [facility] overdose. Pt stated "I am just tired of everything". Pt reported that she is dealing with too many stressors such as relationship conflict and stress at work. Pt also shared that she got into an argument with one of her children.  It has been documented that pt texted her family saying goodbye and when they arrived to her home they found her unresponsive. Pt reported that she attempted suicide approximately 25 years ago by overdosing on pills as well. Pt is currently not receiving any mental health treatment at this time but reported multiple psychiatric admissions. Pt denies HI and AVH at this time. Pt denied having access to weapons and did not report any upcoming court dates. Pt did not report any alcohol or illicit substance abuse at this time. Pt did not report any physical, sexual or emotional abuse at this time.   Diagnosis: Major Depressive Disorder, Recurrent episode, Severe   Past Medical History:  Past Medical History  Diagnosis Date  . Depression   . Asthma   . Diverticulitis   . ADD (attention deficit disorder)     Past Surgical History  Procedure Laterality Date  . Knee surgery Right   . Appendectomy      Family History: History reviewed. No pertinent family history.  Social History:  reports that she has never smoked. She does not have any smokeless tobacco history on file. She reports that she does not drink alcohol or use illicit drugs.  Additional Social History:  Alcohol / Drug Use History of alcohol / drug use?: No history of alcohol / drug abuse  CIWA: CIWA-Ar BP: 129/63 mmHg Pulse Rate: 79 COWS:    PATIENT STRENGTHS: (choose at least two) Average or above average intelligence Capable of independent living  Allergies:  Allergies  Allergen Reactions  . Codeine Itching    Home Medications:  (Not in a hospital admission)  OB/GYN Status:  Patient's last  menstrual period was 08/30/2010.  General Assessment Data Location of Assessment: [facility] TTS Assessment: In system Is this a Tele or Face-to-Face Assessment?: Face-to-Face Is this an Initial Assessment or a Re-assessment for this encounter?: Initial Assessment Living Arrangements: Alone Can pt return to current living arrangement?: Yes Admission Status: Voluntary Is patient capable of signing voluntary admission?: Yes Referral Source: Self/Family/Friend [facility]      Crisis Care Plan Living Arrangements: Alone Name of Psychiatrist: No provider reported at this time. Name of Therapist: No provider reported at this time.  Education Status Is patient currently in school?: No Current Grade: N/A Highest grade of school patient has completed: N/A Name of school: N/A Contact person: N/A  Risk to self with the past 6 months Suicidal Ideation: Yes-Currently Present Has patient been a risk to self within the past 6 months prior to admission? : No Suicidal Intent: Yes-Currently Present Has patient had any suicidal intent within the past 6 months prior to admission? : No Is patient at risk for suicide?: Yes Suicidal Plan?: Yes-Currently Present Has patient had any suicidal plan within the past 6 months prior to admission? : No Specify Current Suicidal Plan: overdose Access to Means: Yes Specify Access to Suicidal Means: access to pills What has been your use of drugs/alcohol within the last 12 months?: Pt denies  Previous Attempts/Gestures: Yes How many times?: 1 Other Self Harm Risks: Pt denies  Triggers for Past Attempts: Unpredictable Intentional Self Injurious  Behavior: None Family Suicide History: No Recent stressful life event(s): Other (Comment), Conflict (Comment) (conflict adult children, "stress at work") Persecutory voices/beliefs?: No Depression: Yes Depression Symptoms: Despondent, Insomnia, Tearfulness, Fatigue, Feeling worthless/self pity Substance abuse  history and/or treatment for substance abuse?: No Suicide prevention information given to non-admitted patients: Not applicable  Risk to Others within the past 6 months Homicidal Ideation: No Does patient have any lifetime risk of violence toward others beyond the six months prior to admission? : No Thoughts of Harm to Others: No Current Homicidal Intent: No Current Homicidal Plan: No Access to Homicidal Means: No Identified Victim: N/A History of harm to others?: No Assessment of Violence: None Noted Violent Behavior Description: No violent behaviors observed.  Does patient have access to weapons?: No Criminal Charges Pending?: No Does patient have a court date: No Is patient on probation?: No  Psychosis Hallucinations: None noted Delusions: None noted  Mental Status Report Appearance/Hygiene: In hospital gown Eye Contact: Fair Motor Activity: Freedom of movement Speech: Soft Level of Consciousness: Quiet/awake Mood: Depressed, Sad Affect: Appropriate to circumstance Anxiety Level: Minimal Thought Processes: Coherent, Relevant Judgement: Partial Orientation: Appropriate for developmental age, Situation, Time, Place, Person Obsessive Compulsive Thoughts/Behaviors: None  Cognitive Functioning Concentration: Normal Memory: Recent Intact, Remote Intact IQ: Average Insight: Poor Impulse Control: Poor Appetite: Poor Weight Loss: 0 Weight Gain: 0 Sleep: Decreased Total Hours of Sleep: 4 Vegetative Symptoms: Unable to Assess  ADLScreening [facility] Assessment Services) Patient's cognitive ability adequate to safely complete daily activities?: Yes Patient able to express need for assistance with ADLs?: Yes Independently performs ADLs?: Yes (appropriate for developmental age)  Prior Inpatient Therapy Prior Inpatient Therapy: Yes Prior Therapy Dates: 1995, 1997 Prior Therapy Facilty/Provider(s): [place], [place]  Reason for Treatment: Depression, suicidal   Prior  Outpatient Therapy Prior Outpatient Therapy: No Does patient have an ACCT team?: No Does patient have Intensive In-House Services?  : No Does patient have [facility] services? : No Does patient have P4CC services?: No  ADL Screening (condition at time of admission) Patient's cognitive ability adequate to safely complete daily activities?: Yes Is the patient deaf or have difficulty hearing?: No Does the patient have difficulty seeing, even when wearing glasses/contacts?: No Does the patient have difficulty concentrating, remembering, or making decisions?: No Patient able to express need for assistance with ADLs?: Yes Does the patient have difficulty dressing or bathing?: No Independently performs ADLs?: Yes (appropriate for developmental age)       Abuse/Neglect Assessment (Assessment to be complete while patient is alone) Physical Abuse: Denies Verbal Abuse: Denies Sexual Abuse: Denies Exploitation of patient/patient's resources: Denies Self-Neglect: Denies     Merchant navy officer (For Healthcare) Does patient have an advance directive?: No    Additional Information 1:1 In Past 12 Months?: Yes CIRT Risk: No Elopement Risk: Yes Does patient have medical clearance?: Yes     Disposition:  Disposition Initial Assessment Completed for this Encounter: Yes Disposition of Patient: Inpatient treatment program Type of inpatient treatment program: Adult  Lynann Demetrius S 09/30/2015 6:21 AM

## 2015-09-30 NOTE — Progress Notes (Signed)
D:Patient in the hallway on approach.  Patient appears sad and depressed.  Patient states, "I feel ashamed."  Patient states she feels she needs to be here.  Patient states she has been going through a lot and states she was at the point where she was overwhelmed.  Patient denies SI/HI and denies AVH.  Patient verbally contracts for safety. A: Staff to monitor Q 15 mins for safety.  Encouragement and support offered.  Scheduled medications administered per orders. R: Patient remains safe on the unit.  Patient attended group tonight.  Patient visible on the unit and interacting with peers.  Patient taking administered medications.

## 2015-09-30 NOTE — BH Assessment (Signed)
Assessment completed. Inpatient treatment is recommended. TTS to seek placement.

## 2015-09-30 NOTE — ED Notes (Signed)
Belongings placed in belongings bag at Nursing desk: -1 silver toned wire bracelet -1 black watch -1 silver toned necklace with clear,red, black stones -1 beige bra -1 black/white vest -1 white turtleneck

## 2015-09-30 NOTE — ED Notes (Signed)
When questioning pt, pt will not verbalize answers and only responds by shaking her head yes or no. Pt has sitter at bedside

## 2015-09-30 NOTE — ED Notes (Signed)
Pt asking about when can she leave to go home. Explain to pt that she has to be seen by TTS.

## 2015-09-30 NOTE — Tx Team (Addendum)
Initial Interdisciplinary Treatment Plan   PATIENT STRESSORS: Financial difficulties Marital or family conflict Medication change or noncompliance Occupational concerns Traumatic event   PATIENT STRENGTHS: Average or above average intelligence Capable of independent living Communication skills Motivation for treatment/growth   PROBLEM LIST: Problem List/Patient Goals Date to be addressed Date deferred Reason deferred Estimated date of resolution  Multiple stressors- family, financial, housing, relationship 09/30/2015      "I knew the signs of my depression, I had too much going on" 09/30/2015     "This has been a stressful year" 09/30/2015     "I have been eating but all junk food" 09/30/2015     Suicide risk                               DISCHARGE CRITERIA:  Ability to meet basic life and health needs Improved stabilization in mood, thinking, and/or behavior Need for constant or close observation no longer present Safe-care adequate arrangements made Verbal commitment to aftercare and medication compliance  PRELIMINARY DISCHARGE PLAN: Attend PHP/IOP Participate in family therapy Return to previous living arrangement  PATIENT/FAMIILY INVOLVEMENT: This treatment plan has been presented to and reviewed with the patient, Tara [name] . The patient and family have been given the opportunity to ask questions and make suggestions.  Aurora Maskwyman, Sara E 09/30/2015, 12:45 PM

## 2015-09-30 NOTE — ED Notes (Signed)
[facility] called to transport pt to [facility]. 

## 2015-09-30 NOTE — ED Notes (Signed)
Per MD okay for patient to drink.

## 2015-09-30 NOTE — [facility] Suicide Risk Assessment (Signed)
[facility] Admission Suicide Risk Assessment   [facility]  Demographic factors:   55 year old female, separated, employed  Current Mental Status:   see below  Loss Factors:   recent work related stressors, in process of selling her home and moving into new one  Historical Factors:   depression, anxiety  Risk Reduction Factors:   resilience , employment   Total Time spent with patient: 45 minutes Principal Problem: depression, suicidal attempt  Diagnosis: MDD, severe, no psychotic symptoms   Continued Clinical Symptoms:  Alcohol Use Disorder Identification Test Final Score (AUDIT): 1 The "Alcohol Use Disorders Identification Test", Guidelines for Use in Primary Care, Second Edition.  World Science writerHealth Organization [facility](WHO). Score between 0-7:  no or low risk or alcohol related problems. Score between 8-15:  moderate risk of alcohol related problems. Score between 16-19:  high risk of alcohol related problems. Score 20 or above:  warrants further diagnostic evaluation for alcohol dependence and treatment.   CLINICAL FACTORS:  55 year old female, reports worsening psychosocial stressors, particularly work related stress and stress related to being in the process of selling her home and buying another one . Reports worsening anxiety and depression, on day prior to admission had  impulsive overdose/ suicide attempt .      Psychiatric Specialty Exam: ROS  Blood pressure 134/83, pulse 100, temperature 98.8 F (37.1 C), temperature source Oral, resp. rate 16, height 5\' 7"  (1.702 m), weight 157 lb (71.215 kg), last menstrual period 08/30/2010, SpO2 99 %.Body mass index is 24.58 kg/(m^2).  See admit note MSE                                                       COGNITIVE FEATURES THAT CONTRIBUTE TO RISK:  Closed-mindedness and Loss of executive function    SUICIDE RISK:   Moderate:  Frequent suicidal ideation with limited intensity,  and duration, some specificity in terms of plans, no associated intent, good self-control, limited dysphoria/symptomatology, some risk factors present, and identifiable protective factors, including available and accessible social support.  PLAN OF CARE: Patient will be admitted to inpatient psychiatric unit for stabilization and safety. Will provide and encourage milieu participation. Provide medication management and maked adjustments as needed.  Will follow daily.    I certify that inpatient services furnished can reasonably be expected to improve the patient's condition.   Nehemiah MassedOBOS, Dela Sweeny, MD 09/30/2015, 3:54 PM

## 2015-09-30 NOTE — ED Notes (Signed)
[facility] and that transport is en route at this time

## 2015-09-30 NOTE — ED Notes (Signed)
Added 1 pair of black pants to patients belongings bag.

## 2015-09-30 NOTE — H&P (Addendum)
Psychiatric Admission Assessment Adult  Patient Identification: Tara [name] MRN:  [id number] Date of Evaluation:  09/30/2015 Chief Complaint:   " I have had a lot of intense stress over the last month" Principal Diagnosis:  Major Depression, Recurrent, no Psychotic Features  Diagnosis:  MDD   History of Present Illness:: 55 year old female, reports she has been under a lot of stress . She states she recently sold her house , and there has been a lot of stress related to the person who bought the house having difficulty with the financing, and then her having issues with transitioning / moving into her  new house . States that at the same time she has had to help arrange a visit from her jobs' VPs  .In addition , her adult daughters " got into this argument in my house", which she reports caused a lot of anxiety . States " it just got to be too much, I needed someone else to take over a little ". She states she impulsively overdosed on " some medications that were there ".  ( as per chart notes, these included Adderall, Meloxicam, Melatonin ) She also sent a goodbye message to her family , who then found her status post overdose and was brought to the hospital.     Associated Signs/Symptoms: Depression Symptoms:  depressed mood, anhedonia, insomnia, suicidal attempt, anxiety, loss of energy/fatigue, (Hypo) Manic Symptoms:  Denies  Anxiety Symptoms: reports severe anxiety, worry, ruminations about stressors as above  Psychotic Symptoms: denies  PTSD Symptoms: Does not endorse   Total Time spent with patient: 45 minutes  Past Psychiatric History:  Two prior  psychiatric admission in her 44s, for depression.  Has attempted suicide in the past, but not in many years, denies any history of self cutting, denies history of violence, denies history of psychosis, describes severe anxiety, which she attributes to her stressors as above ,  Denies history of mania or hypomania.  Is the patient at  risk to self? Yes.    Has the patient been a risk to self in the past 6 months? No.  Has the patient been a risk to self within the distant past? Yes.    Is the patient a risk to others? No.  Has the patient been a risk to others in the past 6 months? No.  Has the patient been a risk to others within the distant past? No.   Prior Inpatient Therapy:  two prior psychiatric admissions , in her 4s Prior Outpatient Therapy:  no recent psychiatric treatment   Alcohol Screening:   Substance Abuse History in the last 12 months: denies alcohol or drug abuse  Consequences of Substance Abuse: Denies  Previous Psychotropic Medications: has been Prozac and Cymbalta in the past, states she weaned off these not due to side effects but because she felt better . She  Has been on Adderall " because it helps me focus ".  Psychological Evaluations: no  Past Medical History: history of psoriasis, allergic to codeine  Past Medical History  Diagnosis Date  . Depression   . Asthma   . Diverticulitis   . ADD (attention deficit disorder)     Past Surgical History  Procedure Laterality Date  . Knee surgery Right   . Appendectomy     Family History: father passed away 26 years ago. Mother alive, has one brother and one sister  Family Psychiatric  History:  Maternal aunts have history of depression, denies suicides in family, paternal  grandfather was alcoholic  Tobacco Screening:  Does not smoke  Social History:  Married x 2, currently separated, has four children, all adults , has 6 grandchildren, employed , recent stressors include moving , difficulty with transitioning from one home to another, issues with her adult children arguing .  History  Alcohol Use No     History  Drug Use No    Additional Social History:  Allergies:   Allergies  Allergen Reactions  . Codeine Itching   Lab Results:  Results for orders placed or performed during the hospital encounter of 09/29/15 (from the past 48  hour(s))  Comprehensive metabolic panel     Status: Abnormal   Collection Time: 09/29/15  9:57 PM  Result Value Ref Range   Sodium 139 135 - 145 mmol/L   Potassium 3.9 3.5 - 5.1 mmol/L   Chloride 107 101 - 111 mmol/L   CO2 22 22 - 32 mmol/L   Glucose, Bld 101 (H) 65 - 99 mg/dL   BUN 21 (H) 6 - 20 mg/dL   Creatinine, Ser 0.98 0.44 - 1.00 mg/dL   Calcium 9.3 8.9 - 10.3 mg/dL   Total Protein 7.4 6.5 - 8.1 g/dL   Albumin 3.9 3.5 - 5.0 g/dL   AST 26 15 - 41 U/L   ALT 20 14 - 54 U/L   Alkaline Phosphatase 67 38 - 126 U/L   Total Bilirubin 0.4 0.3 - 1.2 mg/dL   GFR calc non Af Amer >60 >60 mL/min   GFR calc Af Amer >60 >60 mL/min    Comment: (NOTE) The eGFR has been calculated using the CKD EPI equation. This calculation has not been validated in all clinical situations. eGFR's persistently <60 mL/min signify possible Chronic Kidney Disease.    Anion gap 10 5 - 15  Ethanol (ETOH)     Status: None   Collection Time: 09/29/15  9:57 PM  Result Value Ref Range   Alcohol, Ethyl (B) <5 <5 mg/dL    Comment:        LOWEST DETECTABLE LIMIT FOR SERUM ALCOHOL IS 5 mg/dL FOR MEDICAL PURPOSES ONLY   Salicylate level     Status: None   Collection Time: 09/29/15  9:57 PM  Result Value Ref Range   Salicylate Lvl <5.5 2.8 - 30.0 mg/dL  Acetaminophen level     Status: Abnormal   Collection Time: 09/29/15  9:57 PM  Result Value Ref Range   Acetaminophen (Tylenol), Serum <10 (L) 10 - 30 ug/mL    Comment:        THERAPEUTIC CONCENTRATIONS VARY SIGNIFICANTLY. A RANGE OF 10-30 ug/mL MAY BE AN EFFECTIVE CONCENTRATION FOR MANY PATIENTS. HOWEVER, SOME ARE BEST TREATED AT CONCENTRATIONS OUTSIDE THIS RANGE. ACETAMINOPHEN CONCENTRATIONS >150 ug/mL AT 4 HOURS AFTER INGESTION AND >50 ug/mL AT 12 HOURS AFTER INGESTION ARE OFTEN ASSOCIATED WITH TOXIC REACTIONS.   CBC     Status: None   Collection Time: 09/29/15  9:57 PM  Result Value Ref Range   WBC 8.8 4.0 - 10.5 K/uL   RBC 4.68 3.87 - 5.11  MIL/uL   Hemoglobin 13.9 12.0 - 15.0 g/dL   HCT 41.2 36.0 - 46.0 %   MCV 88.0 78.0 - 100.0 fL   MCH 29.7 26.0 - 34.0 pg   MCHC 33.7 30.0 - 36.0 g/dL   RDW 12.8 11.5 - 15.5 %   Platelets 281 150 - 400 K/uL  CBG monitoring, ED     Status: None   Collection Time: 09/29/15  9:58 PM  Result Value Ref Range   Glucose-Capillary 85 65 - 99 mg/dL  Urine rapid drug screen (hosp performed) (Not at [facility])     Status: Abnormal   Collection Time: 09/30/15 12:08 AM  Result Value Ref Range   Opiates NONE DETECTED NONE DETECTED   Cocaine NONE DETECTED NONE DETECTED   Benzodiazepines NONE DETECTED NONE DETECTED   Amphetamines POSITIVE (A) NONE DETECTED   Tetrahydrocannabinol NONE DETECTED NONE DETECTED   Barbiturates NONE DETECTED NONE DETECTED    Comment:        DRUG SCREEN FOR MEDICAL PURPOSES ONLY.  IF CONFIRMATION IS NEEDED FOR ANY PURPOSE, NOTIFY LAB WITHIN 5 DAYS.        LOWEST DETECTABLE LIMITS FOR URINE DRUG SCREEN Drug Class       Cutoff (ng/mL) Amphetamine      1000 Barbiturate      200 Benzodiazepine   970 Tricyclics       263 Opiates          300 Cocaine          300 THC              50     Blood Alcohol level:  Lab Results  Component Value Date   ETH <5 78/58/8502    Metabolic Disorder Labs:  No results found for: HGBA1C, MPG No results found for: PROLACTIN No results found for: CHOL, TRIG, HDL, CHOLHDL, VLDL, LDLCALC  Current Medications: No current facility-administered medications for this encounter.   PTA Medications: Prescriptions prior to admission  Medication Sig Dispense Refill Last Dose  . amphetamine-dextroamphetamine (ADDERALL) 15 MG tablet Take 15 mg by mouth 2 (two) times daily.   09/29/2015 at Unknown time  . dicyclomine (BENTYL) 20 MG tablet Take 1 tablet (20 mg total) by mouth 2 (two) times daily. (Patient not taking: Reported on 08/21/2014) 20 tablet 0 Not Taking at Unknown time  . meloxicam (MOBIC) 15 MG tablet Take 15 mg by mouth daily as needed  for pain.   09/29/2015 at Unknown time  . mometasone (NASONEX) 50 MCG/ACT nasal spray Place 2 sprays into the nose daily as needed (congestion).    Past Week at Unknown time  . mometasone-formoterol (DULERA) 100-5 MCG/ACT AERO Inhale 2 puffs into the lungs 2 (two) times daily.   Past Week at Unknown time    Musculoskeletal: Strength & Muscle Tone: within normal limits Gait & Station: normal Patient leans: N/A  Psychiatric Specialty Exam: Physical Exam  ROS  Last menstrual period 08/30/2010.There is no weight on file to calculate BMI.  General Appearance: Fairly Groomed  Engineer, water::  Fair  Speech:  Normal Rate  Volume:  Decreased  Mood:  Anxious and Depressed  Affect:  anxious, tearful  Thought Process:  Linear  Orientation:  Other:  fully alert and attentive   Thought Content:  denies hallucinations, no delusions, ruminative about stressors   Suicidal Thoughts:  No at this time denies any suicidal plan or self injurious ideations and contracts for safety   Homicidal Thoughts:  No  Memory:  recent and remote grossly intact   Judgement:  Fair  Insight:  Fair  Psychomotor Activity:  Normal  Concentration:  Good  Recall:  Good  Fund of Knowledge:Good  Language: Good  Akathisia:  NA  Handed:  Right  AIMS (if indicated):     Assets:  Desire for Improvement Resilience  ADL's:  Intact  Cognition: WNL  Sleep:  Treatment Plan Summary: Daily contact with patient to assess and evaluate symptoms and progress in treatment, Medication management, Plan inpatient admission  and medications as below   Observation Level/Precautions:  15 minute checks  Laboratory:  as needed   Psychotherapy:  Milieu, support   Medications:  Patient has responded well to Cymbalta in the past and agrees to restart this medication, will start at 30 mgrs QDAY . Also, will start Ativan PRNs for her severe anxiety.  Consultations:  As needed   Discharge Concerns:  -   Estimated LOS: 5-6 days    Other:     I certify that inpatient services furnished can reasonably be expected to improve the patient's condition.    Neita Garnet, MD 3/17/201712:21 PM
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
## 2015-09-30 NOTE — BH Assessment (Signed)
B[facility] Assessment Progress Note  Per Thedore MinsMojeed Akintayo, MD, this pt requires psychiatric hospitalization at this time.  Baird KayEric Shamberg, RN, [facility]  Pt has signed Voluntary Admission and Consent for Treatment, as well as Consent to Release Information to her PCP, and signed forms have [facility].  Pt's nurse has been notified, and agrees to send original paperwork along with pt via [place], and to call report to [phone].  Doylene Canninghomas Clell Trahan, MA Triage Specialist [phone]

## 2015-10-01 ENCOUNTER — Encounter (HOSPITAL_COMMUNITY): Payer: Self-pay | Admitting: Registered Nurse

## 2015-10-01 DIAGNOSIS — F411 Generalized anxiety disorder: Secondary | ICD-10-CM

## 2015-10-01 DIAGNOSIS — G4700 Insomnia, unspecified: Secondary | ICD-10-CM
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
## 2015-10-01 NOTE — Progress Notes (Addendum)
[facility] MD Progress Note  10/01/2015 3:07 PM Tara [name]  MRN:  [id number]   Subjective:  Reports that she is feeling better today.  States that she was under a lot of stress with selling her house; finding somewhere to live; putting her thing in storage; and just felt overwhelmed.  States that Cymbalta is making her feel sleepy but it may be related to taking one in the late evening yesterday and then taking one this morning.  States that she is eating and sleeping without difficulty and is attending and participating in group sessions.  At this time patient denies suicidal/homicidal ideation, psychosis, and paranoia.    Principal Problem: MDD (major depressive disorder), recurrent severe, without psychosis ([place]) Diagnosis:   Patient Active Problem List   Diagnosis Date Noted  . MDD (major depressive disorder), recurrent severe, without psychosis ([place]) [F33.2] 09/30/2015   Total Time spent with patient: 25 minutes  Past Psychiatric History: Depression; history of psychotropic: Prozac, Zoloft, Wellbutrin and Cymbalta. Adderral because it helps her focus Past Medical History:  Past Medical History  Diagnosis Date  . Depression   . Asthma   . Diverticulitis   . ADD (attention deficit disorder)     Past Surgical History  Procedure Laterality Date  . Knee surgery Right   . Appendectomy     Family History: History reviewed. No pertinent family history. Family Psychiatric  History:  Maternal aunts have history of depression, denies suicides in family, paternal grandfather was alcoholic   Social History:  History  Alcohol Use No     History  Drug Use No    Social History   Social History  . Marital Status: Married    Spouse Name: N/A  . Number of Children: N/A  . Years of Education: N/A   Social History Main Topics  . Smoking status: Never Smoker   . Smokeless tobacco: None  . Alcohol Use: No  . Drug Use: No  . Sexual Activity: Not Asked   Other Topics Concern  . None    Social History Narrative   Additional Social History:                         Sleep: Good  Appetite:  Good  Current Medications: Current Facility-Administered Medications  Medication Dose Route Frequency Provider Last Rate Last Dose  . DULoxetine (CYMBALTA) DR capsule 30 mg  30 mg Oral Daily Jenne Campus, MD   30 mg at 10/01/15 0827  . LORazepam (ATIVAN) tablet 0.5 mg  0.5 mg Oral Q6H PRN Jenne Campus, MD      . mometasone-formoterol (DULERA) 100-5 MCG/ACT inhaler 2 puff  2 puff Inhalation BID Jenne Campus, MD   2 puff at 10/01/15 0827  . ondansetron (ZOFRAN-ODT) disintegrating tablet 4 mg  4 mg Oral Once Lurena Nida, NP   4 mg at 10/01/15 1200  . traZODone (DESYREL) tablet 50 mg  50 mg Oral QHS PRN Jenne Campus, MD        Lab Results:  Results for orders placed or performed during the hospital encounter of 09/29/15 (from the past 48 hour(s))  Comprehensive metabolic panel     Status: Abnormal   Collection Time: 09/29/15  9:57 PM  Result Value Ref Range   Sodium 139 135 - 145 mmol/L   Potassium 3.9 3.5 - 5.1 mmol/L   Chloride 107 101 - 111 mmol/L   CO2 22 22 - 32 mmol/L  Glucose, Bld 101 (H) 65 - 99 mg/dL   BUN 21 (H) 6 - 20 mg/dL   Creatinine, Ser 0.98 0.44 - 1.00 mg/dL   Calcium 9.3 8.9 - 10.3 mg/dL   Total Protein 7.4 6.5 - 8.1 g/dL   Albumin 3.9 3.5 - 5.0 g/dL   AST 26 15 - 41 U/L   ALT 20 14 - 54 U/L   Alkaline Phosphatase 67 38 - 126 U/L   Total Bilirubin 0.4 0.3 - 1.2 mg/dL   GFR calc non Af Amer >60 >60 mL/min   GFR calc Af Amer >60 >60 mL/min    Comment: (NOTE) The eGFR has been calculated using the CKD EPI equation. This calculation has not been validated in all clinical situations. eGFR's persistently <60 mL/min signify possible Chronic Kidney Disease.    Anion gap 10 5 - 15  Ethanol (ETOH)     Status: None   Collection Time: 09/29/15  9:57 PM  Result Value Ref Range   Alcohol, Ethyl (B) <5 <5 mg/dL    Comment:         LOWEST DETECTABLE LIMIT FOR SERUM ALCOHOL IS 5 mg/dL FOR MEDICAL PURPOSES ONLY   Salicylate level     Status: None   Collection Time: 09/29/15  9:57 PM  Result Value Ref Range   Salicylate Lvl <8.3 2.8 - 30.0 mg/dL  Acetaminophen level     Status: Abnormal   Collection Time: 09/29/15  9:57 PM  Result Value Ref Range   Acetaminophen (Tylenol), Serum <10 (L) 10 - 30 ug/mL    Comment:        THERAPEUTIC CONCENTRATIONS VARY SIGNIFICANTLY. A RANGE OF 10-30 ug/mL MAY BE AN EFFECTIVE CONCENTRATION FOR MANY PATIENTS. HOWEVER, SOME ARE BEST TREATED AT CONCENTRATIONS OUTSIDE THIS RANGE. ACETAMINOPHEN CONCENTRATIONS >150 ug/mL AT 4 HOURS AFTER INGESTION AND >50 ug/mL AT 12 HOURS AFTER INGESTION ARE OFTEN ASSOCIATED WITH TOXIC REACTIONS.   CBC     Status: None   Collection Time: 09/29/15  9:57 PM  Result Value Ref Range   WBC 8.8 4.0 - 10.5 K/uL   RBC 4.68 3.87 - 5.11 MIL/uL   Hemoglobin 13.9 12.0 - 15.0 g/dL   HCT 41.2 36.0 - 46.0 %   MCV 88.0 78.0 - 100.0 fL   MCH 29.7 26.0 - 34.0 pg   MCHC 33.7 30.0 - 36.0 g/dL   RDW 12.8 11.5 - 15.5 %   Platelets 281 150 - 400 K/uL  CBG monitoring, ED     Status: None   Collection Time: 09/29/15  9:58 PM  Result Value Ref Range   Glucose-Capillary 85 65 - 99 mg/dL  Urine rapid drug screen (hosp performed) (Not at [facility])     Status: Abnormal   Collection Time: 09/30/15 12:08 AM  Result Value Ref Range   Opiates NONE DETECTED NONE DETECTED   Cocaine NONE DETECTED NONE DETECTED   Benzodiazepines NONE DETECTED NONE DETECTED   Amphetamines POSITIVE (A) NONE DETECTED   Tetrahydrocannabinol NONE DETECTED NONE DETECTED   Barbiturates NONE DETECTED NONE DETECTED    Comment:        DRUG SCREEN FOR MEDICAL PURPOSES ONLY.  IF CONFIRMATION IS NEEDED FOR ANY PURPOSE, NOTIFY LAB WITHIN 5 DAYS.        LOWEST DETECTABLE LIMITS FOR URINE DRUG SCREEN Drug Class       Cutoff (ng/mL) Amphetamine      1000 Barbiturate      200 Benzodiazepine    662 Tricyclics  300 Opiates          300 Cocaine          300 THC              50     Blood Alcohol level:  Lab Results  Component Value Date   ETH <5 09/29/2015    Physical Findings: AIMS: Facial and Oral Movements Muscles of Facial Expression: None, normal Lips and Perioral Area: None, normal Jaw: None, normal Tongue: None, normal,Extremity Movements Upper (arms, wrists, hands, fingers): None, normal Lower (legs, knees, ankles, toes): None, normal, Trunk Movements Neck, shoulders, hips: None, normal, Overall Severity Severity of abnormal movements (highest score from questions above): None, normal Incapacitation due to abnormal movements: None, normal Patient's awareness of abnormal movements (rate only patient's report): No Awareness, Dental Status Does patient usually wear dentures?: No  CIWA:    COWS:     Musculoskeletal: Strength & Muscle Tone: within normal limits Gait & Station: normal Patient leans: N/A  Psychiatric Specialty Exam: Review of Systems  Psychiatric/Behavioral: Positive for depression. Negative for hallucinations. Suicidal ideas: Denies at this time. The patient is nervous/anxious and has insomnia (Improving).   All other systems reviewed and are negative.   Blood pressure 113/66, pulse 78, temperature 98.4 F (36.9 C), temperature source Oral, resp. rate 16, height _0  (1.702 m), weight 71.215 kg (157 lb), last menstrual period 08/30/2010, SpO2 99 %.Body mass index is 24.58 kg/(m^2).  General Appearance: Casual  Eye Contact::  Good  Speech:  Clear and Coherent and Normal Rate  Volume:  Normal  Mood:  Anxious and Depressed  Affect:  Congruent and Depressed  Thought Process:  Circumstantial and Goal Directed  Orientation:  Other:  Denies at this time  Thought Content:  Denies hallucinations, delusions, and paranoia  Suicidal Thoughts:  Denies at this time  Homicidal Thoughts:  No  Memory:  Immediate;   Good Recent;   Good Remote;    Good  Judgement:  Fair  Insight:  Fair  Psychomotor Activity:  Normal  Concentration:  Good  Recall:  Good  Fund of Knowledge:Good  Language: Good  Akathisia:  No  Handed:  Right  AIMS (if indicated):     Assets:  Communication Skills Desire for Improvement Resilience Social Support  ADL's:  Intact  Cognition: WNL  Sleep:  Number of Hours: 6.75   Treatment Plan Summary: Daily contact with patient to assess and evaluate symptoms and progress in treatment and Medication management  Continue to encourage attendance/participation in group sessions Continue Q 15 minute checks for safety Continue to monitor mood/behavior Social Work to continue working with patient on discharge concerns and follow up Medication management:    Depression:  Cymbalta 30 mg daily for depression  Severe Anxiety: Ativan 0.5 mg Q 6 hr prn  Insomnia: Trazodone 50 mg Q hs prn   [name], Shuvon, NP 10/01/2015, 3:07 PM I agree with assessment and plan Geralyn Flash A. Sabra Heck, M.D.

## 2015-10-01 NOTE — Progress Notes (Signed)
Patient ID: Tara [name], female   DOB: 05/24/1961, 55 y.o.   MRN: [id number]  Adult Psychoeducational Group Note  Date:  10/01/2015 Time: 1:15pm   Group Topic/Focus:  Making Healthy Choices:   The focus of this group is to help patients identify negative/unhealthy choices they were using prior to admission and identify positive/healthier coping strategies to replace them upon discharge.  Participation Level:  Did Not Attend  Participation Quality: n/a  Affect: n/a  Cognitive:  n/a  Insight: n/a  Engagement in Group:n/a  Modes of Intervention:  Discussion, Education and Support  Additional Comments:  Pt chose not to attend group, pt in bed asleep.   Aurora Maskwyman, Bonnetta Allbee E 10/01/2015, 2:48 PM

## 2015-10-01 NOTE — [facility] Group Notes (Signed)
[facility] LCSW Group Therapy  10/01/2015 /10 AM  Type of Therapy:  Group Therapy  Participation Level:  Active  Participation Quality:  Appropriate, Attentive and Sharing  Affect:  Flat  Cognitive:  Alert and Oriented  Insight:  Developing/Improving  Engagement in Therapy:  Developing/Improving  Modes of Intervention:  Discussion, Exploration, Rapport Building, Socialization and Support Summary of Progress/Problems: The main focus of today's process group was for the patient to identify ways in which they have in the past sabotaged their own recovery. Motivational Interviewing was utilized to ask the group members what they get out of their self sabotaging behavior(s), and what reasons they may have for wanting to change. The Stages of Change were explained using a handout, and patients identified where they currently are with regard to stages of change. Patient identified herself as in preparation stage and states she realizes she frequently tries to do everything herself and reported willingness to ask for help.  Patient was challenged to ask someone on unit (another patient) for help with something today.     Carney Bernatherine C Sebastien Jackson, LCSW

## 2015-10-01 NOTE — Progress Notes (Signed)
Patient ID: Tara [name], female   DOB: 04/30/1961, 55 y.o.   MRN: [id number]   Pt currently presents with a flat affect and depressed behavior. Per self inventory, pt rates depression at a 4, hopelessness 0 and anxiety 4. Pt's daily goal is to "develop skills to cope with feeling overwhelmed" and they intend to do so by "get some rest, make a list of to do items and begin to address them one at a time." Pt reports good sleep, a fair appetite, normal energy and good concentration.   Pt provided with medications per providers orders. Pt's labs and vitals were monitored throughout the day. Pt supported emotionally and encouraged to express concerns and questions. Pt educated on medications.  Pt's safety ensured with 15 minute and environmental checks. Pt currently denies SI/HI and A/V hallucinations. Pt verbally agrees to seek staff if SI/HI or A/VH occurs and to consult with staff before acting on these thoughts. Pt reports "finances - I'm a one income household/person, I have no idea what being here is going to cost after insurance." Will continue POC.

## 2015-10-01 NOTE — Progress Notes (Signed)
Writer spoke with patient 1:1 and she reports having had a good day. She was observed up in the dayroom off and on watching tv. She attended group and participated. She reports that she is looking in to starting outpatient therapy but [place] since she already works in the that area. Encouraged her to talk with her SW about this and Clinical research associatewriter will report to on-coming nurse. Writer informed her of medications available if needed and she reoprts that she will not need any. Safety maintained on unit with 15 min checks.

## 2015-10-01 NOTE — Plan of Care (Signed)
Problem: Diagnosis: Increased Risk For Suicide Attempt Goal: STG-Patient Will Attend All Groups On The Unit Outcome: Progressing Patient attended evening group and participated.     

## 2015-10-02 DIAGNOSIS — F332 Major depressive disorder, recurrent severe without psychotic features: Principal | ICD-10-CM

## 2015-10-02 NOTE — Progress Notes (Signed)
Adult Psychoeducational Group Note  Date:  10/02/2015 Time:  9:05 PM  Group Topic/Focus:  Wrap-Up Group:   The focus of this group is to help patients review their daily goal of treatment and discuss progress on daily workbooks.  Participation Level:  Active  Participation Quality:  Attentive  Affect:  Appropriate  Cognitive:  Appropriate  Insight: Appropriate  Engagement in Group:  Engaged  Modes of Intervention:  Discussion  Additional Comments:  Pt rated her day a 8 out of 10. Pt goal for tomorrow is to talk with case worker about getting outside help upon discharge to help with ongoing therapy.   Tara [name] 10/02/2015, 9:05 PM

## 2015-10-02 NOTE — Progress Notes (Signed)
[facility]  10/02/2015 5:32 PM Tara PointerJill [name]  MRN:  [id number]   Subjective:  Reports that she is feeling better today.  States as she did yesterday that she was under a lot of stress with selling her house; finding somewhere to live; putting her thing in storage; and just felt overwhelmed.  States that Cymbalta is making her feel sleepy but it may be related to taking one in the late evening yesterday and then taking one this morning.  States that she is eating and sleeping without difficulty and is attending and participating in group sessions.  At this time patient denies suicidal/homicidal ideation, psychosis, and paranoia.  Expressed desire to be discharged tomorrow.  Principal Problem: MDD (major depressive disorder), recurrent severe, without psychosis (HCC) Diagnosis:   Patient Active Problem List   Diagnosis Date Noted  . Insomnia [G47.00]   . Generalized anxiety disorder [F41.1]   . MDD (major depressive disorder), recurrent severe, without psychosis (HCC) [F33.2] 09/30/2015   Total Time spent with patient: 25 minutes  Past Psychiatric History: Depression; history of psychotropic: Prozac, Zoloft, Wellbutrin and Cymbalta. Adderral because it helps her focus Past Medical History:  Past Medical History  Diagnosis Date  . Depression   . Asthma   . Diverticulitis   . ADD (attention deficit disorder)     Past Surgical History  Procedure Laterality Date  . Knee surgery Right   . Appendectomy     Family History: History reviewed. No pertinent family history. Family Psychiatric  History:  Maternal aunts have history of depression, denies suicides in family, paternal grandfather was alcoholic   Social History:  History  Alcohol Use No     History  Drug Use No    Social History   Social History  . Marital Status: Married    Spouse Name: N/A  . Number of Children: N/A  . Years of Education: N/A   Social History Main Topics  . Smoking status: Never Smoker   .  Smokeless tobacco: None  . Alcohol Use: No  . Drug Use: No  . Sexual Activity: Not Asked   Other Topics Concern  . None   Social History Narrative   Additional Social History:     Sleep: Good  Appetite:  Good  Current Medications: Current Facility-Administered Medications  Medication Dose Route Frequency Provider Last Rate Last Dose  . DULoxetine (CYMBALTA) DR capsule 30 mg  30 mg Oral Daily Craige CottaFernando A Cobos, MD   30 mg at 10/02/15 0804  . LORazepam (ATIVAN) tablet 0.5 mg  0.5 mg Oral Q6H PRN Craige CottaFernando A Cobos, MD      . mometasone-formoterol (DULERA) 100-5 MCG/ACT inhaler 2 puff  2 puff Inhalation BID Craige CottaFernando A Cobos, MD   2 puff at 10/02/15 0804  . ondansetron (ZOFRAN-ODT) disintegrating tablet 4 mg  4 mg Oral Once Kristeen MansFran E Hobson, NP   4 mg at 10/01/15 1200  . traZODone (DESYREL) tablet 50 mg  50 mg Oral QHS PRN Craige CottaFernando A Cobos, MD        Lab Results:  No results found for this or any previous visit (from the past 48 hour(s)).  Blood Alcohol level:  Lab Results  Component Value Date   ETH <5 09/29/2015    Physical Findings: AIMS: Facial and Oral Movements Muscles of Facial Expression: None, normal Lips and Perioral Area: None, normal Jaw: None, normal Tongue: None, normal,Extremity Movements Upper (arms, wrists, hands, fingers): None, normal Lower (legs, knees, ankles, toes): None, normal,  Trunk Movements Neck, shoulders, hips: None, normal, Overall Severity Severity of abnormal movements (highest score from questions above): None, normal Incapacitation due to abnormal movements: None, normal Patient's awareness of abnormal movements (rate only patient's report): No Awareness, Dental Status Does patient usually wear dentures?: No  CIWA:    COWS:     Musculoskeletal: Strength & Muscle Tone: within normal limits Gait & Station: normal Patient leans: N/A  Psychiatric Specialty Exam: Review of Systems  Psychiatric/Behavioral: Positive for depression. Negative  for hallucinations. Suicidal ideas: Denies at this time. The patient is nervous/anxious and has insomnia (Improving).   All other systems reviewed and are negative.   Blood pressure 112/66, pulse 73, temperature 98.5 F (36.9 C), temperature source Oral, resp. rate 16, height  (1.702 m), weight 71.215 kg (157 lb), last menstrual period 08/30/2010, SpO2 99 %.Body mass index is 24.58 kg/(m^2).  General Appearance: Casual  Eye Contact::  Good  Speech:  Clear and Coherent and Normal Rate  Volume:  Normal  Mood:  Anxious and Depressed  Affect:  Congruent and Depressed  Thought Process:  Circumstantial and Goal Directed  Orientation:  Other:  Denies at this time  Thought Content:  Denies hallucinations, delusions, and paranoia  Suicidal Thoughts:  Denies at this time  Homicidal Thoughts:  No  Memory:  Immediate;   Good Recent;   Good Remote;   Good  Judgement:  Fair  Insight:  Fair  Psychomotor Activity:  Normal  Concentration:  Good  Recall:  Good  Fund of Knowledge:Good  Language: Good  Akathisia:  No  Handed:  Right  AIMS (if indicated):     Assets:  Communication Skills Desire for Improvement Resilience Social Support  ADL's:  Intact  Cognition: WNL  Sleep:  Number of Hours: 6.75   Treatment Plan Summary: Daily contact with patient to assess and evaluate symptoms and progress in treatment and Medication management  Continue to encourage attendance/participation in group sessions Continue Q 15 minute checks for safety Continue to monitor mood/behavior Social Work to continue working with patient on discharge concerns and follow up Medication management:    Depression:  Cymbalta 30 mg daily for depression  Severe Anxiety: Ativan 0.5 mg Q 6 hr prn  Insomnia: Trazodone 50 mg Q hs prn   [name], NP [facility] 10/02/2015, 5:32 PM I agree with assessment and plan [name] A. Dub Mikes, M.D.

## 2015-10-02 NOTE — Progress Notes (Signed)
Patient has been up and active on the unit, attended group this evening and requested no medication to aid with sleep. She reports having had a good day and had her son and college friend to visit her tonight. She is hopeful to discharge on tomorrow.  Patient currently denies having pain, -si/hi/a/v hall. Support and encouragement offered, safety maintained on unit, will continue to monitor.

## 2015-10-02 NOTE — Progress Notes (Signed)
Patient ID: Milinda PointerJill Dekay, female   DOB: 07/31/1960, 55 y.o.   MRN: [id number]  Adult Psychoeducational Group Note  Date:  10/02/2015 Time:  09:15 AM  Group Topic/Focus:  Healthy Communication:   The focus of this group is to discuss communication, barriers to communication, as well as healthy ways to communicate with others. Healthy Support Systems  Participation Level:  Active  Participation Quality:  Appropriate, Attentive, Sharing and Supportive  Affect:  Appropriate  Cognitive:  Appropriate  Insight: Improving  Engagement in Group:  Engaged and Improving  Modes of Intervention:  Activity, Discussion, Education, Problem-solving, Role-play and Support  Additional Comments:  Pt shared insight into ineffective and effective communication used in personal life. Pt expresses intent to identify one positive support system before discharge.   [place] Maskwyman, Mickelle Goupil E 10/02/2015, 10:53 AM

## 2015-10-02 NOTE — [facility] Group Notes (Signed)
[place] LCSW Group Therapy  10/02/2015  10:15 AM  Type of Therapy:  Group Therapy  Participation Quality:  Attentive  Affect:  Flat  Cognitive:  Alert and Oriented  Insight:  Developing/Improving  Engagement in Therapy:  Limited  Modes of Intervention:  Activity, Discussion, Education, Rapport Building, Socialization and Support  Summary of Progress/Problems: Patients processed thoughts and feelings about up coming discharge. We then discussed what is a supportive framework? What does it look like feel like and how do I discern it from and unhealthy non-supportive network?  We then looked at visuals representing relationships as patients identified difficulties with boundaries, expectations, and poor choices within relationships. Patient shared how she was able to met challenge assigned and ask others for help yesterday. Patient reports that she believes she can be more direct with requests. Patient identified lack of self care when caring for others is self defeating.   Sheilah Pigeon, LCSW

## 2015-10-02 NOTE — Progress Notes (Signed)
Patient ID: Tara PointerJill Berber, female   DOB: 02/02/1961, 55 y.o.   MRN: [id number]   Pt currently presents with a depressed affect and cooperative behavior. Per self inventory, pt rates depression at a 4, hopelessness 0 and anxiety 4. Pt's daily goal is "organizing my thoughts and making a plan for returning home" and they intend to do so by "make some lists." Pt reports good sleep, a good appetite, normal energy and good concentration. Pt remains in bed for most of the day today, attends groups.   Pt provided with medications per providers orders. Pt's labs and vitals were monitored throughout the day. Pt supported emotionally and encouraged to express concerns and questions. Pt educated on medications and encouraged to interact with peers.  Pt's safety ensured with 15 minute and environmental checks. Pt currently denies SI/HI and A/V hallucinations. Pt verbally agrees to seek staff if SI/HI or A/VH occurs and to consult with staff before acting on these thoughts. Pt reports that her biggest concern about her discharge is "finances." Will continue POC.

## 2015-10-03 MED ORDER — DULOXETINE HCL 20 MG PO CPEP
40.0000 mg | ORAL_CAPSULE | Freq: Every day | ORAL | Status: DC
  Filled 2015-10-03 (×3): qty 2

## 2015-10-03 NOTE — [facility] Group Notes (Signed)
[facility]  10/03/2015 8:45 AM  Participation Quality: Alert, Appropriate and Oriented  Mood/Affect: Appropriate  Depression Rating: 0  Anxiety Rating: 0  Thoughts of Suicide: Pt denies SI/HI  Will you contract for safety? Yes  Current AVH: Pt denies  Plan for Discharge/Comments: Pt attended discharge planning group and actively participated in group. CSW discussed suicide prevention education with the group and encouraged them to discuss discharge planning and any relevant barriers. Pt reports improved mood and is agreeable to a referral for therapy. She desires to stay with her primary care doctor for medications.  Transportation Means: Pt reports access to transportation  Supports: No supports mentioned at this time  Chad CordialLauren [name], LCSWA 10/03/2015 9:17 AM

## 2015-10-03 NOTE — Plan of Care (Signed)
Problem: Consults Goal: Depression Patient Education See Patient Education Module for education specifics.  Outcome: Progressing Nurse discussed depression/anxiety/coping skills with patient.        

## 2015-10-03 NOTE — [facility] Group Notes (Signed)
[facility] LCSW Group Therapy  10/03/2015 1:15pm  Type of Therapy:  Group Therapy vercoming Obstacles  Participation Level:  Minimal  Participation Quality:  N/A  Affect:  Appropriate  Cognitive:  Appropriate and Oriented  Insight: Unable to Assess  Engagement in Therapy:  Limited  Modes of Intervention:  Discussion, Exploration, Problem-solving and Support  Description of Group:   In this group patients will be encouraged to explore what they see as obstacles to their own wellness and recovery. They will be guided to discuss their thoughts, feelings, and behaviors related to these obstacles. The group will process together ways to cope with barriers, with attention given to specific choices patients can make. Each patient will be challenged to identify changes they are motivated to make in order to overcome their obstacles. This group will be process-oriented, with patients participating in exploration of their own experiences as well as giving and receiving support and challenge from other group members.  Summary of Patient Progress: Pt did not participate in group discussion, despite prompting from CSW.   Therapeutic Modalities:   Cognitive Behavioral Therapy Solution Focused Therapy Motivational Interviewing Relapse Prevention Therapy   Chad CordialLauren [name], LCSWA 10/03/2015 2:55 PM

## 2015-10-03 NOTE — Plan of Care (Signed)
Problem: Consults Goal: Depression Patient Education See Patient Education Module for education specifics.  Outcome: Progressing Nurse discussed depression/coping skills with patient.        

## 2015-10-03 NOTE — Progress Notes (Addendum)
Patient ID: Tara [name], female   DOB: [birth year], 55 y.o.   MRN: [id number] [facility] MD Progress Note  10/03/2015 1:14 PM [name]  MRN:  [id number]   Subjective:  Patient reports she is feeling better- reports improved mood and feeling less overwhelmed. She does remain anxious, and continues to feel " stressed", but states that she has been able to sleep better, and take her mind off the issues that were stressing her for periods of time. She states she feels more optimistic that she can successfully handle her stressors and is hoping for discharge soon . Denies medication side effects. Objective : I have discussed case with treatment team and have met with patient . Compared to admission she is improved , with improved overall mood and improved range of affect. Describes ongoing anxiety, but states she is feeling less overwhelmed and more optimistic. Tolerating medications well . Visible on unit, going to some groups, behavior on unit calm, in good control .  Principal Problem: MDD (major depressive disorder), recurrent severe, without psychosis ([place]) Diagnosis:   Patient Active Problem List   Diagnosis Date Noted  . Insomnia [G47.00]   . Generalized anxiety disorder [F41.1]   . MDD (major depressive disorder), recurrent severe, without psychosis ([name]) [F33.2] 09/30/2015   Total Time spent with patient: 20 minutes   Past Psychiatric History: Depression; history of psychotropic: Prozac, Zoloft, Wellbutrin and Cymbalta. Adderral because it helps her focus Past Medical History:  Past Medical History  Diagnosis Date  . Depression   . Asthma   . Diverticulitis   . ADD (attention deficit disorder)     Past Surgical History  Procedure Laterality Date  . Knee surgery Right   . Appendectomy     Family History: History reviewed. No pertinent family history. Family Psychiatric  History:  Maternal aunts have history of depression, denies suicides in family, paternal grandfather was  alcoholic   Social History:  History  Alcohol Use No     History  Drug Use No    Social History   Social History  . Marital Status: Married    Spouse Name: N/A  . Number of Children: N/A  . Years of Education: N/A   Social History Main Topics  . Smoking status: Never Smoker   . Smokeless tobacco: None  . Alcohol Use: No  . Drug Use: No  . Sexual Activity: Not Asked   Other Topics Concern  . None   Social History Narrative   Additional Social History:     Sleep: Good  Appetite:  Good  Current Medications: Current Facility-Administered Medications  Medication Dose Route Frequency Provider Last Rate Last Dose  . DULoxetine (CYMBALTA) DR capsule 30 mg  30 mg Oral Daily Jenne Campus, MD   30 mg at 10/03/15 0820  . LORazepam (ATIVAN) tablet 0.5 mg  0.5 mg Oral Q6H PRN Jenne Campus, MD      . mometasone-formoterol (DULERA) 100-5 MCG/ACT inhaler 2 puff  2 puff Inhalation BID Jenne Campus, MD   2 puff at 10/03/15 0820  . ondansetron (ZOFRAN-ODT) disintegrating tablet 4 mg  4 mg Oral Once Lurena Nida, NP   4 mg at 10/01/15 1200  . traZODone (DESYREL) tablet 50 mg  50 mg Oral QHS PRN Jenne Campus, MD        Lab Results:  No results found for this or any previous visit (from the past 48 hour(s)).  Blood Alcohol level:  Lab Results  Component Value  Date   ETH <5 09/29/2015    Physical Findings: AIMS: Facial and Oral Movements Muscles of Facial Expression: None, normal Lips and Perioral Area: None, normal Jaw: None, normal Tongue: None, normal,Extremity Movements Upper (arms, wrists, hands, fingers): None, normal Lower (legs, knees, ankles, toes): None, normal, Trunk Movements Neck, shoulders, hips: None, normal, Overall Severity Severity of abnormal movements (highest score from questions above): None, normal Incapacitation due to abnormal movements: None, normal Patient's awareness of abnormal movements (rate only patient's report): No  Awareness, Dental Status Current problems with teeth and/or dentures?: No Does patient usually wear dentures?: No  CIWA:  CIWA-Ar Total: 1 COWS:  COWS Total Score: 1  Musculoskeletal: Strength & Muscle Tone: within normal limits Gait & Station: normal Patient leans: N/A  Psychiatric Specialty Exam: Review of Systems  Psychiatric/Behavioral: Positive for depression. Negative for hallucinations. Suicidal ideas: Denies at this time. The patient is nervous/anxious and has insomnia (Improving).   All other systems reviewed and are negative.   Blood pressure 124/61, pulse 67, temperature 98.4 F (36.9 C), temperature source Oral, resp. rate 16, height '5\' 7"'  (1.702 m), weight 157 lb (71.215 kg), last menstrual period 08/30/2010, SpO2 99 %.Body mass index is 24.58 kg/(m^2).  General Appearance: Casual  Eye Contact::  Good  Speech:  Normal Rate  Volume:  Normal  Mood:  Less depressed, remains anxious , but states that overall feeling better   Affect:  Congruent and more reactive, not tearful today, smiles at times appropriately  Thought Process:  Goal Directed  Orientation:  Other:  Denies at this time  Thought Content:  No hallucinations, no delusions   Suicidal Thoughts:  Denies at this time- contracts for safety on unit  Homicidal Thoughts:  No  Memory:  Immediate;   Good Recent;   Good Remote;   Good  Judgement:  Improved   Insight:  Improved   Psychomotor Activity:  Normal  Concentration:  Good  Recall:  Good  Fund of Knowledge:Good  Language: Good  Akathisia:  No  Handed:  Right  AIMS (if indicated):     Assets:  Communication Skills Desire for Improvement Resilience Social Support  ADL's:  Intact  Cognition: WNL  Sleep:  Number of Hours: 6.25  Assessment - at this time patient improved compared to admission , presents with improved mood and affect , although still somewhat ruminative about stressors and anxious . No SI, and is future oriented, looking forward to  discharging soon and returning home and to work in the near future . Denies feeling overwhelmed by stressors at this time and is more optimistic she will be able to handle them well. Tolerating Cymbalta well, does ask to move it to night time dosing  as she feels it causes some sedation . Treatment Plan Summary: Daily contact with patient to assess and evaluate symptoms and progress in treatment and Medication management  Continue to encourage attendance/participation in group sessions Social Work working on discharge plans  and follow up Medication management:    Depression:   Increase Cymbalta to 40 mg at night time for depression  Severe Anxiety: Ativan 0.5 mg Q 6 hr prn  D/C  Trazodone as patient states sleeping well without it, does not feel she needs it  Neita Garnet, MD  10/03/2015, 1:14 PM

## 2015-10-03 NOTE — Progress Notes (Signed)
D:  Patient's self inventory sheet, patient sleeps good, no sleep medication given.  Good appetite, low energy level, good concentration.  Denied depression and hopeless, rated anxiety 3.  Denied withdrawals.  Denied SI.  Denied physical problems.  Denied pain.  Goal is to build a support system.  Plans to talk to family/friends about signs of depression.  Does have discharge plans. A:  Medications administered per MD orders.  Emotional support and encouragement given patient. R:  Denied SI and HI, contracts for safety.  Denied A/V hallucinations.  Safety maintained with 15 minute checks.

## 2015-10-03 NOTE — Progress Notes (Signed)
Recreation Therapy Notes  Date: 03.20.2017 Time: 9:30am Location: [address] Group Room   Group Topic: Stress Management  Goal Area(s) Addresses:  Patient will actively participate in stress management techniques presented during session.   Behavioral Response: Appropriate   Intervention: Stress management techniques  Activity : Progressive Body Relaxation and Progressive Body Scan. LRT provided education, instruction and demonstration on practice of Progressive Body Relaxation and Progressive Body Scan. Patient was asked to participate in technique introduced during session.   Education:  Stress Management, Discharge Planning.   Education Outcome: Acknowledges education  Clinical Observations/Feedback: Patient actively engaged in technique introduced, expressed no concerns and demonstrated ability to practice independently post d/c.   Marykay Lexenise L Breckyn Troyer, LRT/CTRS        Medina Degraffenreid L 10/03/2015 2:01 PM

## 2015-10-03 NOTE — Progress Notes (Signed)
D: Pt has depressed affect and mood.  She reports her day was "good" and that she had a good visit with a friend and her husband tonight.  She reports her goal is to "get resources for when I move out of here and the social worker is working on that."  Pt denies SI/HI, denies hallucinations, denies pain.  Pt has been visible in milieu interacting with peers and staff appropriately.  Pt attended evening group.   A: Introduced self to pt.  Actively listened to pt and offered support and encouragement.  Cymbalta held tonight per on-site provider's instruction because pt received a dose this morning.  R: Pt verbally contracts for safety and reports that she will inform staff of needs and concerns.  Will continue to monitor and assess.

## 2015-10-03 NOTE — Plan of Care (Signed)
Problem: Diagnosis: Increased Risk For Suicide Attempt Goal: STG-Patient Will Report Suicidal Feelings to Staff Outcome: Progressing Patient currently denies suicidal ideations.      

## 2015-10-03 NOTE — [facility] Suicide Risk Assessment (Signed)
[facility] INPATIENT:  Family/Significant Other Suicide Prevention Education  Suicide Prevention Education:  Education Completed; Tara [name], Pt's friend [phone], has been identified by the patient as the family member/significant other with whom the patient will be residing, and identified as the person(s) who will aid the patient in the event of a mental health crisis (suicidal ideations/suicide attempt).  With written consent from the patient, the family member/significant other has been provided the following suicide prevention education, prior to the and/or following the discharge of the patient.  The suicide prevention education provided includes the following:  Suicide risk factors  Suicide prevention and interventions  National Suicide Hotline telephone number  [facility] assessment telephone number  [facility] Emergency Assistance 911  [facility] and/or [facility]idential [facility] Crisis Unit telephone number  Request made of family/significant other to:  Remove weapons (e.g., guns, rifles, knives), all items previously/currently identified as safety concern.    Remove drugs/medications (over-the-counter, prescriptions, illicit drugs), all items previously/currently identified as a safety concern.  The family member/significant other verbalizes understanding of the suicide prevention education information provided.  The family member/significant other agrees to remove the items of safety concern listed above.  Tara [name], Conard Alvira M 10/03/2015, 1:13 PM

## 2015-10-03 NOTE — Plan of Care (Signed)
Problem: Alteration in mood Goal: LTG-Patient reports reduction in suicidal thoughts (Patient reports reduction in suicidal thoughts and is able to verbalize a safety plan for whenever patient is feeling suicidal)  Outcome: Progressing Patient currently denies suicidal ideations.      

## 2015-10-03 NOTE — Progress Notes (Signed)
Adult Psychoeducational Group Note  Date:  10/03/2015 Time:  10:24 PM  Group Topic/Focus:  Wrap-Up Group:   The focus of this group is to help patients review their daily goal of treatment and discuss progress on daily workbooks.  Participation Level:  Active  Participation Quality:  Attentive  Affect:  Appropriate  Cognitive:  Alert and Appropriate  Insight: Appropriate  Engagement in Group:  Engaged  Modes of Intervention:  Discussion  Additional Comments:  Pt overall had a good day. Pt goal is to go home tomorrow.   Merlinda FrederickKeshia S Obe Ahlers 10/03/2015, 10:24 PM

## 2015-10-04 MED ORDER — DULOXETINE HCL 40 MG PO CPEP: 40.0000 mg | ORAL_CAPSULE | Freq: Every day | ORAL | Status: DC

## 2015-10-04 MED ORDER — MOMETASONE FURO-FORMOTEROL FUM 100-5 MCG/ACT IN AERO: 2.0000 | INHALATION_SPRAY | Freq: Two times a day (BID) | RESPIRATORY_TRACT | Status: DC

## 2015-10-04 NOTE — Progress Notes (Signed)
D:  Patient's self inventory sheet, patient sleeps good, no sleep medication given.  Good appetite, normal energy level, good concentration.  Denied depression, hopeless and anxiety #3.  Denied withdrawals.  Denied SI.  Denied physical problems.  Denied pain.  Goal is to organize move.  Plans to discharge.  "Thank you all."  Does have discharge plans. A:  Medications administered per MD orders.  Emotional support and encouragement given patient. R:  Denied SI and HI, contracts for safety.  Denied A/V hallucinations.  Safety maintained with 15 minute checks.

## 2015-10-04 NOTE — Tx Team (Signed)
Interdisciplinary Treatment Plan Update (Adult) Date: 10/04/2015   Date: 10/04/2015 3:46 PM  Progress in Treatment:  Attending groups: Yes Participating in groups: No Taking medication as prescribed: Yes  Tolerating medication: Yes  Family/Significant othe contact made: Yes, with friend Patient understands diagnosis: Yes AEB seeking help with depression and anxiety Discussing patient identified problems/goals with staff: Yes  Medical problems stabilized or resolved: Yes  Denies suicidal/homicidal ideation: Yes Patient has not harmed self or Others: Yes   New problem(s) identified: None identified at this time.   Discharge Plan or Barriers: Pt will return home and follow-up with outpatient providers  Additional comments:  Patient and CSW reviewed pt's identified goals and treatment plan. Patient verbalized understanding and agreed to treatment plan. CSW reviewed [facility] "Discharge Process and Patient Involvement" Form. Pt verbalized understanding of information provided and signed form.   Reason for Continuation of Hospitalization:  Anxiety Depression Medication stabilization Suicidal ideation  Estimated length of stay: 0 days  Review of initial/current patient goals per problem list:   1.  Goal(s): Patient will participate in aftercare plan  Met:  Yes  Target date: 3-5 days from date of admission   As evidenced by: Patient will participate within aftercare plan AEB aftercare provider and housing plan at discharge being identified.   10/04/15: Pt will return home and follow-up with outpatient providers  2.  Goal (s): Patient will exhibit decreased depressive symptoms and suicidal ideations.  Met:  Yes  Target date: 3-5 days from date of admission   As evidenced by: Patient will utilize self rating of depression at 3 or below and demonstrate decreased signs of depression or be deemed stable for discharge by MD.  10/04/15: Pt reports depression at 0/10; denies SI  3.   Goal(s): Patient will demonstrate decreased signs and symptoms of anxiety.  Met:  Yes  Target date: 3-5 days from date of admission   As evidenced by: Patient will utilize self rating of anxiety at 3 or below and demonstrated decreased signs of anxiety, or be deemed stable for discharge by MD  10/04/15: Pt rates anxiety at 3/10  Attendees:  Patient:    Family:    Physician: Dr. Parke Poisson, MD  10/04/2015 3:46 PM  Nursing: Lars Pinks, RN Case manager  10/04/2015 3:46 PM  Clinical Social Worker Peri Maris, [place] 10/04/2015 3:46 PM  Other: Tilden Fossa, [place] 10/04/2015 3:46 PM  Clinical: Gaylan Gerold, RN; Grayland Ormond, RN 10/04/2015 3:46 PM  Other: , RN Charge Nurse 10/04/2015 3:46 PM  Other: Hilda Lias, [place], [place] Work [phone]

## 2015-10-04 NOTE — BHH Suicide Risk Assessment (Signed)
[facility]   Principal Problem: MDD (major depressive disorder), recurrent severe, without psychosis (HCC) Discharge Diagnoses:  Patient Active Problem List   Diagnosis Date Noted  . Insomnia [G47.00]   . Generalized anxiety disorder [F41.1]   . MDD (major depressive disorder), recurrent severe, without psychosis (HCC) [F33.2] 09/30/2015    Total Time spent with patient: 30 minutes  Musculoskeletal: Strength & Muscle Tone: within normal limits Gait & Station: normal Patient leans: N/A  Psychiatric Specialty Exam: ROS  Blood pressure 111/67, pulse 66, temperature 98.5 F (36.9 C), temperature source Oral, resp. rate 14, height 5\' 7"  (1.702 m), weight 157 lb (71.215 kg), last menstrual period 08/30/2010, SpO2 99 %.Body mass index is 24.58 kg/(m^2).  General Appearance: Well Groomed  Eye Contact::  Good  Speech:  Normal Rate409  Volume:  Normal  Mood:  much improved at this time, less depressed   Affect:  Appropriate and more reactive   Thought Process:  Linear  Orientation:  Full (Time, Place, and Person)  Thought Content:  no hallucinations, no delusions  Suicidal Thoughts:  No denies any suicidal or self injurious ideations   Homicidal Thoughts:  No  Memory:  recent and remote grossly intact   Judgement:  Good  Insight:  improved   Psychomotor Activity:  Normal  Concentration:  Good  Recall:  Good  Fund of Knowledge:Good  Language: Good  Akathisia:  Negative  Handed:  Right  AIMS (if indicated):     Assets:  Communication Skills Desire for Improvement Resilience  Sleep:  Number of Hours: 6.75  Cognition: WNL  ADL's:  Intact   Mental Status Per Nursing Assessment::   On Admission:     Demographic Factors:  55 year old female, separated, employed.  Loss Factors: In the process of selling her house and moving into a new one, family stressors, separation x 1 year, work related stressors   Historical Factors: Two prior admissions  many years for depression. History of suicide attempt by overdosing in the distant past   Risk Reduction Factors:   Sense of responsibility to family, Religious beliefs about death, Employed and Positive coping skills or problem solving skills  Continued Clinical Symptoms:  At this time much improved, with an improved mood and fuller range of affect, no thought disorder, no SI, no HI, no psychotic symptoms, future oriented, looking forward to returning to work as of later this week   Cognitive Features That Contribute To Risk:  No gross cognitive deficits noted upon discharge. Is alert , attentive, and oriented x 3   Suicide Risk:  Mild:  Suicidal ideation of limited frequency, intensity, duration, and specificity.  There are no identifiable plans, no associated intent, mild dysphoria and related symptoms, good self-control (both objective and subjective assessment), few other risk factors, and identifiable protective factors, including available and accessible social support.  Follow-up Information    [facility] 10/13/2015.   Why:  at 12:15pm for medication management with [name].   Contact information:   [address] [place], [place] [address] Call: [phone]      Plan Of Care/Follow-up recommendations:  Activity:  as tolerated Diet:  Regular Tests:  NA Other:  See below   Patient is leaving unit in good spirits Plans to return home Follow up as above   Nehemiah MassedOBOS, Lisa Blakeman, MD 10/04/2015, 11:14 AM

## 2015-10-04 NOTE — Progress Notes (Signed)
  [facility] :  Will you be returning to the same living situation after discharge:  Yes,  Pt returning home At discharge, do you have transportation home?: Yes,  Pt son to pick up Do you have the ability to pay for your medications: Yes,  Pt provided with prescriptions  Release of information consent forms completed and in the chart;  Patient's signature needed at discharge.  Patient to Follow up at: Follow-up Information    [facility] On 10/13/2015.   Why:  at 12:15pm for medication management with Tara [name].   Contact information:   [address] [place], [place] [address] Call: [phone]      Follow up with [facility] On 10/24/2015.   Why:  at 5:00pm for therapy with Tara [name]. Please bring paperwork and insurance card. This was the first available appointment.   Contact information:   [address], [place], [place] [address] Phone: [phone]      Next level of care provider has access to [facility] Link:no  Safety Planning and Suicide Prevention discussed: Yes,  with friend; see SPE note for further details  Have you used any form of tobacco in the last 30 days? (Cigarettes, Smokeless Tobacco, Cigars, and/or Pipes): No  Has patient been referred to the Quitline?: N/A patient is not a smoker  Patient has been referred for addiction treatment: N/A  Tara [name], Tara [name] 10/04/2015, 3:51 PM

## 2015-10-04 NOTE — BHH Group Notes (Signed)

## 2015-10-04 NOTE — Progress Notes (Signed)
Discharge Note:  Patient discharged home with family member.  Patient denied SI and HI.  Denied A/V hallucinations.  Suicide prevention information given and discussed with patient who stated she understood and had no questions.  Patient stated she received all her belongings, clothing, misc items, toiletries, prescriptions, etc.  Patient stated she appreciated all [facility].  All discharge information given to patient at discharge per [facility] requirements.

## 2015-10-04 NOTE — Discharge Summary (Signed)
Physician Discharge Summary Note  Patient:  Tara [name] is an 55 y.o., female MRN:  [id number] DOB:  Apr 15, 1961 Patient phone:  [phone] (home)  Patient address:   [address] [place] [address],  Total Time spent with patient: Greater than 30 minutes  Date of Admission:  09/30/2015 Date of Discharge: 10-04-15  Reason for Admission: Worsening symptoms of depression/anxiety  Principal Problem: MDD (major depressive disorder), recurrent severe, without psychosis [facility])  Discharge Diagnoses: Patient Active Problem List   Diagnosis Date Noted  . Severe episode of recurrent major depressive disorder, without psychotic features (HCC) [F33.2]   . Insomnia [G47.00]   . Generalized anxiety disorder [F41.1]   . MDD (major depressive disorder), recurrent severe, without psychosis (HCC) [F33.2] 09/30/2015   Past Psychiatric History: Major depression  Past Medical History:  Past Medical History  Diagnosis Date  . Depression   . Asthma   . Diverticulitis   . ADD (attention deficit disorder)     Past Surgical History  Procedure Laterality Date  . Knee surgery Right   . Appendectomy     Family History: History reviewed. No pertinent family history.  Family Psychiatric  History: See H&P  Social History:  History  Alcohol Use No     History  Drug Use No    Social History   Social History  . Marital Status: Married    Spouse Name: N/A  . Number of Children: N/A  . Years of Education: N/A   Social History Main Topics  . Smoking status: Never Smoker   . Smokeless tobacco: None  . Alcohol Use: No  . Drug Use: No  . Sexual Activity: Not Asked   Other Topics Concern  . None   Social History Narrative   Hospital Course: 55 year old female, reports she has been under a lot of stress . She states she recently sold her house, and there has been a lot of stress related to the person who bought the house having difficulty with the financing, and then her having  issues with transitioning/moving into hernew house . States that at the same time she has had to help arrange a visit from her jobs' VPs. In addition, her adult daughters "got into this argument in my house", which she reports caused a lot of anxiety. States " it just got to be too much, I needed someone else to take over a little". She states she impulsively overdosed on " some medications that were there ". ( as per chart notes, these included Adderall, Meloxicam, Melatonin ) She also sent a goodbye message to her family, who then found her status post overdose and was brought to the hospital.  Tara [name] was admitted to the [facility] adult unit with complaints of worsening symptoms of depression & anxiety triggering her suicide attempt by overdose. She cited work, familial as well as making the adjustment of transitioning into a new home after selling her house as the triggering stressors. She was in need of mood stabilization treatments. During the course of her hospital stay, Tara [name] was medicated & discharged on, Duloxetine 40 mg for depression. She was enrolled & participated in the group counseling sessions being offered & held on this unit. She was counseled & learned coping skills that should help her cope better & maintain mood stability after discharge. She was resumed on all her pertinent home medications for the other previously existing medical issues that she presented. She tolerated her treatment regimen without any adverse effects reported. While  her treatment was on going, Tara [name]'s improvement was monitored by observation & her daily reports of symptom reduction noted.  Her emotional & mental status were monitored by the daily self-inventory reports completed by her & the clinical staff.          Tara [name] was evaluated daily by the treatment team for mood stability & the need for continued recovery after discharge. Her motivation was an integral factor in her recovery & mood stability. She was offered further  treatment options upon discharge & will follow up with the outpatient psychiatric services as listed below.     Upon discharge, Tara [name] was both mentally & medically stable for discharge. She is currently denying suicidal, homicidal ideation, auditory, visual/tactile hallucinations, delusional thoughts & or paranoia. She left [facility] with all personal belongings in no apparent distress. Transportation per son.   Physical Findings: AIMS: Facial and Oral Movements Muscles of Facial Expression: None, normal Lips and Perioral Area: None, normal Jaw: None, normal Tongue: None, normal,Extremity Movements Upper (arms, wrists, hands, fingers): None, normal Lower (legs, knees, ankles, toes): None, normal, Trunk Movements Neck, shoulders, hips: None, normal, Overall Severity Severity of abnormal movements (highest score from questions above): None, normal Incapacitation due to abnormal movements: None, normal Patient's awareness of abnormal movements (rate only patient's report): No Awareness, Dental Status Current problems with teeth and/or dentures?: No Does patient usually wear dentures?: No  CIWA:  CIWA-Ar Total: 1 COWS:  COWS Total Score: 1  Musculoskeletal: Strength & Muscle Tone: within normal limits Gait & Station: normal Patient leans: N/A  Psychiatric Specialty Exam: Review of Systems  Constitutional: Negative.   HENT: Negative.   Eyes: Negative.   Respiratory: Negative.   Cardiovascular: Negative.   Gastrointestinal: Negative.   Genitourinary: Negative.   Musculoskeletal: Negative.   Skin: Negative.   Neurological: Negative.   Endo/Heme/Allergies: Negative.   Psychiatric/Behavioral: Positive for depression (Stable). Negative for suicidal ideas, hallucinations, memory loss and substance abuse. The patient has insomnia (Stable). The patient is not nervous/anxious.     Blood pressure 111/67, pulse 66, temperature 98.5 F (36.9 C), temperature source Oral, resp. rate 14, height   (1.702 m), weight 71.215 kg (157 lb), last menstrual period 08/30/2010, SpO2 99 %.Body mass index is 24.58 kg/(m^2).  See Md's SRA   Have you used any form of tobacco in the last 30 days? (Cigarettes, Smokeless Tobacco, Cigars, and/or Pipes): No  Has this patient used any form of tobacco in the last 30 days? (Cigarettes, Smokeless Tobacco, Cigars, and/or Pipes): No  Blood Alcohol level:  Lab Results  Component Value Date   ETH <5 09/29/2015   Metabolic Disorder Labs:  No results found for: HGBA1C, MPG No results found for: PROLACTIN No results found for: CHOL, TRIG, HDL, CHOLHDL, VLDL, LDLCALC  See Psychiatric Specialty Exam and Suicide Risk Assessment completed by Attending Physician prior to discharge.  Discharge destination:  Home  Is patient on multiple antipsychotic therapies at discharge:  No   Has Patient had three or more failed trials of antipsychotic monotherapy by history:  No  Recommended Plan for Multiple Antipsychotic Therapies: NA    Medication List    STOP taking these medications        amphetamine-dextroamphetamine 15 MG tablet  Commonly known as:  ADDERALL     dicyclomine 20 MG tablet  Commonly known as:  BENTYL     meloxicam 15 MG tablet  Commonly known as:  MOBIC     mometasone 50 MCG/ACT nasal spray  Commonly known as:  NASONEX      TAKE these medications      Indication   DULoxetine HCl 40 MG Cpep  Take 40 mg by mouth at bedtime. For depression   Indication:  Major Depressive Disorder     mometasone-formoterol 100-5 MCG/ACT Aero  Commonly known as:  DULERA  Inhale 2 puffs into the lungs 2 (two) times daily. For Shortness of breath   Indication:  Asthma       Follow-up Information    [facility].   Why:  at 12:15pm for medication management with [name].   Contact information:   [address] [place], [place] [address] Call: [phone]      Follow up [facility] On 10/24/2015.   Why:  at 5:00pm for therapy with Dennard NipEugene. Please bring paperwork and insurance card. This was the first available appointment.   Contact information:   [address], [place], [place] [address] Phone: [phone]     Follow-up recommendations: Activity:  As tolerated Diet: As recommended by your primary care doctor. Keep all scheduled follow-up appointments as recommended.   Comments: Take all your medications as prescribed by your mental healthcare provider. Report any adverse effects and or reactions from your medicines to your outpatient provider promptly. Patient is instructed and cautioned to not engage in alcohol and or illegal drug use while on prescription medicines. In the event of worsening symptoms, patient is instructed to call the crisis hotline, 911 and or go to the nearest ED for appropriate evaluation and treatment of symptoms. Follow-up with your primary care provider for your other medical issues, concerns and or health care needs.   Signed: Sanjuana KavaNwoko, Agnes I, NP, PMHNP, FNP-BC 10/05/2015, 1:37 PM   Patient seen, Suicide Assessment Completed.  Disposition Plan Reviewed

## 2015-11-15 ENCOUNTER — Other Ambulatory Visit: Payer: Self-pay | Admitting: Obstetrics and Gynecology

## 2015-11-16 LAB — CYTOLOGY - PAP

## 2015-11-29 ENCOUNTER — Other Ambulatory Visit (HOSPITAL_BASED_OUTPATIENT_CLINIC_OR_DEPARTMENT_OTHER): Payer: Self-pay | Admitting: Family Medicine

## 2015-11-29 DIAGNOSIS — Z1231 Encounter for screening mammogram for malignant neoplasm of breast: Secondary | ICD-10-CM

## 2016-01-10 ENCOUNTER — Ambulatory Visit (HOSPITAL_BASED_OUTPATIENT_CLINIC_OR_DEPARTMENT_OTHER): Payer: Self-pay

## 2016-01-10 ENCOUNTER — Ambulatory Visit (HOSPITAL_BASED_OUTPATIENT_CLINIC_OR_DEPARTMENT_OTHER)
Admission: RE | Admit: 2016-01-10 | Discharge: 2016-01-10 | Disposition: A | Discharge status: Home / Self Care | Payer: 59 | Source: Ambulatory Visit | Attending: Family Medicine | Admitting: Family Medicine

## 2016-01-10 DIAGNOSIS — R921 Mammographic calcification found on diagnostic imaging of breast: Secondary | ICD-10-CM | POA: Diagnosis not present

## 2016-01-10 DIAGNOSIS — Z1231 Encounter for screening mammogram for malignant neoplasm of breast: Secondary | ICD-10-CM | POA: Diagnosis present

## 2016-01-13 ENCOUNTER — Other Ambulatory Visit: Payer: Self-pay | Admitting: Family Medicine

## 2016-01-13 DIAGNOSIS — R928 Other abnormal and inconclusive findings on diagnostic imaging of breast: Secondary | ICD-10-CM

## 2016-01-17 ENCOUNTER — Emergency Department (HOSPITAL_BASED_OUTPATIENT_CLINIC_OR_DEPARTMENT_OTHER)
Admission: EM | Admit: 2016-01-17 | Discharge: 2016-01-17 | Disposition: A | Discharge status: Home / Self Care | Payer: 59 | Attending: Emergency Medicine | Admitting: Emergency Medicine

## 2016-01-17 ENCOUNTER — Encounter (HOSPITAL_BASED_OUTPATIENT_CLINIC_OR_DEPARTMENT_OTHER): Payer: Self-pay | Admitting: *Deleted

## 2016-01-17 ENCOUNTER — Emergency Department (HOSPITAL_BASED_OUTPATIENT_CLINIC_OR_DEPARTMENT_OTHER): Payer: 59

## 2016-01-17 DIAGNOSIS — F329 Major depressive disorder, single episode, unspecified: Secondary | ICD-10-CM | POA: Diagnosis not present

## 2016-01-17 DIAGNOSIS — R1032 Left lower quadrant pain: Secondary | ICD-10-CM | POA: Diagnosis present

## 2016-01-17 DIAGNOSIS — F909 Attention-deficit hyperactivity disorder, unspecified type: Secondary | ICD-10-CM | POA: Insufficient documentation

## 2016-01-17 DIAGNOSIS — Z79899 Other long term (current) drug therapy: Secondary | ICD-10-CM | POA: Diagnosis not present

## 2016-01-17 DIAGNOSIS — K5792 Diverticulitis of intestine, part unspecified, without perforation or abscess without bleeding: Secondary | ICD-10-CM | POA: Insufficient documentation

## 2016-01-17 DIAGNOSIS — J45909 Unspecified asthma, uncomplicated: Secondary | ICD-10-CM | POA: Diagnosis not present

## 2016-01-17 LAB — CBC WITH DIFFERENTIAL/PLATELET
Basophils Absolute: 0 10*3/uL (ref 0.0–0.1)
Basophils Relative: 0 %
Eosinophils Absolute: 0.1 10*3/uL (ref 0.0–0.7)
Eosinophils Relative: 1 %
HCT: 40.4 % (ref 36.0–46.0)
Hemoglobin: 13.3 g/dL (ref 12.0–15.0)
LYMPHS ABS: 1.4 10*3/uL (ref 0.7–4.0)
LYMPHS PCT: 14 %
MCH: 30.3 pg (ref 26.0–34.0)
MCHC: 32.9 g/dL (ref 30.0–36.0)
MCV: 92 fL (ref 78.0–100.0)
MONOS PCT: 7 %
Monocytes Absolute: 0.7 10*3/uL (ref 0.1–1.0)
NEUTROS PCT: 78 %
Neutro Abs: 7.6 10*3/uL (ref 1.7–7.7)
PLATELETS: 273 10*3/uL (ref 150–400)
RBC: 4.39 MIL/uL (ref 3.87–5.11)
RDW: 13.1 % (ref 11.5–15.5)
WBC: 9.8 10*3/uL (ref 4.0–10.5)

## 2016-01-17 LAB — COMPREHENSIVE METABOLIC PANEL
ALT: 23 U/L (ref 14–54)
AST: 30 U/L (ref 15–41)
Albumin: 3.7 g/dL (ref 3.5–5.0)
Alkaline Phosphatase: 66 U/L (ref 38–126)
Anion gap: 6 (ref 5–15)
BUN: 13 mg/dL (ref 6–20)
CHLORIDE: 106 mmol/L (ref 101–111)
CO2: 27 mmol/L (ref 22–32)
Calcium: 9.1 mg/dL (ref 8.9–10.3)
Creatinine, Ser: 0.95 mg/dL (ref 0.44–1.00)
Glucose, Bld: 109 mg/dL — ABNORMAL HIGH (ref 65–99)
POTASSIUM: 3.7 mmol/L (ref 3.5–5.1)
SODIUM: 139 mmol/L (ref 135–145)
Total Bilirubin: 0.6 mg/dL (ref 0.3–1.2)
Total Protein: 7.3 g/dL (ref 6.5–8.1)

## 2016-01-17 LAB — URINALYSIS, ROUTINE W REFLEX MICROSCOPIC
Bilirubin Urine: NEGATIVE
GLUCOSE, UA: NEGATIVE mg/dL
Hgb urine dipstick: NEGATIVE
KETONES UR: NEGATIVE mg/dL
LEUKOCYTES UA: NEGATIVE
Nitrite: NEGATIVE
PH: 7.5 (ref 5.0–8.0)
Protein, ur: NEGATIVE mg/dL
Specific Gravity, Urine: 1.005 (ref 1.005–1.030)

## 2016-01-17 MED ORDER — ONDANSETRON HCL 4 MG/2ML IJ SOLN
4.0000 mg | Freq: Once | INTRAMUSCULAR | Status: AC
  Administered 2016-01-17: 4 mg via INTRAVENOUS
  Filled 2016-01-17: qty 2

## 2016-01-17 MED ORDER — IOPAMIDOL (ISOVUE-300) INJECTION 61%
100.0000 mL | Freq: Once | INTRAVENOUS | Status: AC | PRN
  Administered 2016-01-17: 100 mL via INTRAVENOUS

## 2016-01-17 MED ORDER — METRONIDAZOLE 500 MG PO TABS: 500.0000 mg | ORAL_TABLET | Freq: Three times a day (TID) | ORAL | Status: DC

## 2016-01-17 MED ORDER — SODIUM CHLORIDE 0.9 % IV BOLUS (SEPSIS)
1000.0000 mL | Freq: Once | INTRAVENOUS | Status: AC
  Administered 2016-01-17: 1000 mL via INTRAVENOUS

## 2016-01-17 MED ORDER — METRONIDAZOLE 500 MG PO TABS
500.0000 mg | ORAL_TABLET | Freq: Once | ORAL | Status: AC
  Administered 2016-01-17: 500 mg via ORAL
  Filled 2016-01-17: qty 1

## 2016-01-17 MED ORDER — CIPROFLOXACIN HCL 500 MG PO TABS
500.0000 mg | ORAL_TABLET | Freq: Once | ORAL | Status: AC
  Administered 2016-01-17: 500 mg via ORAL
  Filled 2016-01-17: qty 1

## 2016-01-17 MED ORDER — NAPROXEN 250 MG PO TABS
500.0000 mg | ORAL_TABLET | Freq: Once | ORAL | Status: AC
  Administered 2016-01-17: 500 mg via ORAL
  Filled 2016-01-17: qty 2

## 2016-01-17 MED ORDER — CIPROFLOXACIN HCL 500 MG PO TABS: 500.0000 mg | ORAL_TABLET | Freq: Two times a day (BID) | ORAL | Status: DC

## 2016-01-17 NOTE — ED Provider Notes (Signed)
CSN: [id number]     Arrival date & time 01/17/16  1412 History   First MD Initiated Contact with Patient 01/17/16 1426     Chief Complaint  Patient presents with  . Abdominal Pain     (Consider location/radiation/quality/duration/timing/severity/associated sxs/prior Treatment) HPI   Tara [name] is a 55 y.o. female, with a history of diverticulitis, appendectomy, and psoriasis, presenting to the ED with lower left quadrant abdominal pain. Pain has been intermittent for the last few weeks, but constant today. Endorses chills and states, "I feel achy like I have fever." Has tried Aleve previously, but none today. Nausea, but no vomiting. Some improvement after BM. Had soft stools yesterday, but two normal BMs today. Rates her pain at 8/10, stabbing, nonradiating. Patient states the pain feels like her previous instances of diverticulitis. Last food intake was around 1300 today. Denies melena/hematochezia, urinary complaints, back pain, sustained diarrhea, or any other complaints.    Past Medical History  Diagnosis Date  . Depression   . Asthma   . Diverticulitis   . ADD (attention deficit disorder)    Past Surgical History  Procedure Laterality Date  . Knee surgery Right   . Appendectomy     No family history on file. Social History  Substance Use Topics  . Smoking status: Never Smoker   . Smokeless tobacco: None  . Alcohol Use: No   OB History    No data available     Review of Systems  Constitutional: Positive for fever and chills.  Gastrointestinal: Positive for nausea, abdominal pain and diarrhea. Negative for vomiting, constipation and blood in stool.  Genitourinary: Negative for dysuria, hematuria and vaginal discharge.  All other systems reviewed and are negative.     Allergies  Codeine  Home Medications   Prior to Admission medications   Medication Sig Start Date End Date Taking? Authorizing Provider  BuPROPion HCl (WELLBUTRIN PO) Take by mouth.   Yes  Historical Provider, MD  DULoxetine 40 MG CPEP Take 40 mg by mouth at bedtime. For depression 10/04/15   Sanjuana KavaAgnes I Nwoko, NP  mometasone-formoterol (DULERA) 100-5 MCG/ACT AERO Inhale 2 puffs into the lungs 2 (two) times daily. For Shortness of breath 10/04/15   Sanjuana KavaAgnes I Nwoko, NP   BP 143/67 mmHg  Pulse 79  Temp(Src) 99.2 F (37.3 C) (Oral)  Resp 16  Ht 5\' 7"  (1.702 m)  Wt 74.844 kg  BMI 25.84 kg/m2  SpO2 100%  LMP 08/30/2010 Physical Exam  Constitutional: She appears well-developed and well-nourished. No distress.  HENT:  Head: Normocephalic and atraumatic.  Eyes: Conjunctivae are normal.  Neck: Neck supple.  Cardiovascular: Normal rate, regular rhythm, normal heart sounds and intact distal pulses.   Pulmonary/Chest: Effort normal and breath sounds normal. No respiratory distress.  Abdominal: Soft. Normal appearance. There is tenderness in the suprapubic area and left lower quadrant. There is no guarding and no CVA tenderness.  Patient grimaces and shows obvious discomfort whenever she moves or changes position.  Musculoskeletal: She exhibits no edema or tenderness.  Lymphadenopathy:    She has no cervical adenopathy.  Neurological: She is alert.  Skin: Skin is warm and dry. She is not diaphoretic.  Psychiatric: She has a normal mood and affect. Her behavior is normal.  Nursing note and vitals reviewed.   ED Course  Procedures (including critical care time) Labs Review Labs Reviewed  COMPREHENSIVE METABOLIC PANEL - Abnormal; Notable for the following:    Glucose, Bld 109 (*)    All other  components within normal limits  CBC WITH DIFFERENTIAL/PLATELET  URINALYSIS, ROUTINE W REFLEX MICROSCOPIC (NOT AT [facility])    Imaging Review No results found. I have personally reviewed and evaluated these lab results as part of my medical decision-making.   EKG Interpretation None      MDM   Final diagnoses:  Left lower quadrant pain    Tara PointerJill [name] presents with lower left  quadrant abdominal pain intermittently for the last 2-3 weeks, constant today.  Patient states the pain feels like her diverticulitis and physical exam correlates with this theory. Patient is nontoxic appearing, afebrile, not tachycardic, not tachypneic, not hypotensive, and maintains SPO2 of 100% on room air. Patient has no signs of sepsis or other serious or life-threatening condition. Patient was offered narcotic analgesic, but instead opted for naproxen. Pain reduced to 3/10 with 500 mg of naproxen.  End of shift patient care handoff report given to Rhea BleacherJosh Geiple, PA-C. Plan: Review CT scan results and discharge with any necessary outpatient therapy.   Filed Vitals:   01/17/16 1418  BP: 143/67  Pulse: 79  Temp: 99.2 F (37.3 C)  TempSrc: Oral  Resp: 16  Height: 5\' 7"  (1.702 m)  Weight: 74.844 kg  SpO2: 100%       Anselm PancoastShawn C Rhett Mutschler, PA-C 01/17/16 1642  Geoffery Lyonsouglas Delo, MD 01/18/16 2057

## 2016-01-17 NOTE — Discharge Instructions (Signed)
Please read and follow all provided instructions.  Your diagnoses today include:  1. Acute diverticulitis   2. Left lower quadrant pain     Tests performed today include:  Blood counts and electrolytes  Blood tests to check liver and kidney function  Blood tests to check pancreas function  Urine test to look for infection  CT scan - shows two areas of diverticulitis without complication  Vital signs. See below for your results today.   Medications prescribed:   Ciprofloxacin - antibiotic  You have been prescribed an antibiotic medicine: take the entire course of medicine even if you are feeling better. Stopping early can cause the antibiotic not to work.   Metronidazole - antibiotic  You have been prescribed an antibiotic medicine: take the entire course of medicine even if you are feeling better. Stopping early can cause the antibiotic not to work. Do not drink alcohol when taking this medication.   Take any prescribed medications only as directed.  Home care instructions:   Follow any educational materials contained in this packet.  Follow-up instructions: Please follow-up with your primary care provider in the next 2 days for further evaluation of your symptoms.    Return instructions:  SEEK IMMEDIATE MEDICAL ATTENTION IF:  The pain does not go away or becomes severe   A temperature above 101F develops   Repeated vomiting occurs (multiple episodes)   The pain becomes localized to portions of the abdomen. The right side could possibly be appendicitis. In an adult, the left lower portion of the abdomen could be colitis or diverticulitis.   Blood is being passed in stools or vomit (bright red or black tarry stools)   You develop chest pain, difficulty breathing, dizziness or fainting, or become confused, poorly responsive, or inconsolable (young children)  If you have any other emergent concerns regarding your health  Additional Information: Abdominal  (belly) pain can be caused by many things. Your caregiver performed an examination and possibly ordered blood/urine tests and imaging (CT scan, x-rays, ultrasound). Many cases can be observed and treated at home after initial evaluation in the emergency department. Even though you are being discharged home, abdominal pain can be unpredictable. Therefore, you need a repeated exam if your pain does not resolve, returns, or worsens. Most patients with abdominal pain don't have to be admitted to the hospital or have surgery, but serious problems like appendicitis and gallbladder attacks can start out as nonspecific pain. Many abdominal conditions cannot be diagnosed in one visit, so follow-up evaluations are very important.  Your vital signs today were: BP 143/67 mmHg   Pulse 79   Temp(Src) 99.2 F (37.3 C) (Oral)   Resp 16   Ht 5\' 7"  (1.702 m)   Wt 74.844 kg   BMI 25.84 kg/m2   SpO2 100%   LMP 08/30/2010 If your blood pressure (bp) was elevated above 135/85 this visit, please have this repeated by your doctor within one month. --------------

## 2016-01-17 NOTE — ED Provider Notes (Signed)
5:24 PM Handoff from [name] at shift change. I spoke with radiologist regarding CT results and reviewed imaging myself. Patient has 2 discrete areas of diverticulitis without complication.  Patient informed of results. Will give first dose of antibiotics in emergency department. Tara [name] has had some pressure with urination but is able to urinate. I strongly encouraged patient to follow-up with her primary care physician, Dr. Merri Brunetteandace [name], in the next several days for recheck. Patient would like to continue over-the-counter medications for pain.  The patient was urged to return to the Emergency Department immediately with worsening of current symptoms, worsening abdominal pain, persistent vomiting, blood noted in stools, fever, or any other concerns. The patient verbalized understanding.   BP 143/67 mmHg  Pulse 79  Temp(Src) 99.2 F (37.3 C) (Oral)  Resp 16  Ht 5\' 7"  (1.702 m)  Wt 74.844 kg  BMI 25.84 kg/m2  SpO2 100%  LMP 08/30/2010   Results for orders placed or performed during the hospital encounter of 01/17/16  Comprehensive metabolic panel  Result Value Ref Range   Sodium 139 135 - 145 mmol/L   Potassium 3.7 3.5 - 5.1 mmol/L   Chloride 106 101 - 111 mmol/L   CO2 27 22 - 32 mmol/L   Glucose, Bld 109 (H) 65 - 99 mg/dL   BUN 13 6 - 20 mg/dL   Creatinine, Ser 1.610.95 0.44 - 1.00 mg/dL   Calcium 9.1 8.9 - 09.610.3 mg/dL   Total Protein 7.3 6.5 - 8.1 g/dL   Albumin 3.7 3.5 - 5.0 g/dL   AST 30 15 - 41 U/L   ALT 23 14 - 54 U/L   Alkaline Phosphatase 66 38 - 126 U/L   Total Bilirubin 0.6 0.3 - 1.2 mg/dL   GFR calc non Af Amer >60 >60 mL/min   GFR calc Af Amer >60 >60 mL/min   Anion gap 6 5 - 15  CBC with Differential  Result Value Ref Range   WBC 9.8 4.0 - 10.5 K/uL   RBC 4.39 3.87 - 5.11 MIL/uL   Hemoglobin 13.3 12.0 - 15.0 g/dL   HCT 04.540.4 40.936.0 - 81.146.0 %   MCV 92.0 78.0 - 100.0 fL   MCH 30.3 26.0 - 34.0 pg   MCHC 32.9 30.0 - 36.0 g/dL   RDW 91.413.1 78.211.5 - 95.615.5 %   Platelets 273 150 -  400 K/uL   Neutrophils Relative % 78 %   Neutro Abs 7.6 1.7 - 7.7 K/uL   Lymphocytes Relative 14 %   Lymphs Abs 1.4 0.7 - 4.0 K/uL   Monocytes Relative 7 %   Monocytes Absolute 0.7 0.1 - 1.0 K/uL   Eosinophils Relative 1 %   Eosinophils Absolute 0.1 0.0 - 0.7 K/uL   Basophils Relative 0 %   Basophils Absolute 0.0 0.0 - 0.1 K/uL  Urinalysis, Routine w reflex microscopic  Result Value Ref Range   Color, Urine YELLOW YELLOW   APPearance CLEAR CLEAR   Specific Gravity, Urine 1.005 1.005 - 1.030   pH 7.5 5.0 - 8.0   Glucose, UA NEGATIVE NEGATIVE mg/dL   Hgb urine dipstick NEGATIVE NEGATIVE   Bilirubin Urine NEGATIVE NEGATIVE   Ketones, ur NEGATIVE NEGATIVE mg/dL   Protein, ur NEGATIVE NEGATIVE mg/dL   Nitrite NEGATIVE NEGATIVE   Leukocytes, UA NEGATIVE NEGATIVE   Ct Abdomen Pelvis W Contrast  01/17/2016  CLINICAL DATA:  Left lower quadrant pain for weeks with worsening today, nausea, history of diverticulitis EXAM: CT ABDOMEN AND PELVIS WITH CONTRAST  TECHNIQUE: Multidetector CT imaging of the abdomen and pelvis was performed using the standard protocol following bolus administration of intravenous contrast. CONTRAST:  100mL ISOVUE-300 IOPAMIDOL (ISOVUE-300) INJECTION 61% COMPARISON:  04/26/2014 FINDINGS: Lower chest:  Lung bases are unremarkable. Hepatobiliary: No focal hepatic mass. There is a distended gallbladder without evidence of calcified gallstones. No intrahepatic biliary ductal dilatation. Pancreas: Enhanced pancreas is unremarkable. Spleen: No focal splenic mass. Adrenals/Urinary Tract: No adrenal gland mass. Enhanced kidneys are symmetrical in size. No hydronephrosis or hydroureter. Delayed renal images shows bilateral renal symmetrical excretion. Bilateral visualized proximal ureter is unremarkable. Stomach/Bowel: Small hiatal hernia. No small bowel obstruction. No pericecal inflammation. The patient is status post appendectomy. The terminal ileum is unremarkable. Scattered  diverticula are noted descending colon. Multiple sigmoid colon diverticula. In axial image 65 there is focal thickening of colonic wall and diverticular wall in proximal sigmoid colon. There is stranding of pericolonic fat. Findings are consistent with acute diverticulitis or focal colitis. No evidence of diverticular abscess or perforation. No distal colonic obstruction. No extravasation of oral contrast material. Mild redundant sigmoid colon. Coronal image 33 there is a second focus of mild thickening of colonic wall in sigmoid colon proximally with mild stranding of surrounding fat. Findings suspicious for a second focus of diverticulitis. Again no evidence of pericolonic abscess. Vascular/Lymphatic: No retroperitoneal or mesenteric adenopathy. No aortic aneurysm. Reproductive: The uterus and ovaries are unremarkable. Other: Moderate distended urinary bladder. No ascites or free abdominal air. Musculoskeletal: Sagittal images of the spine shows no destructive bony lesions. There is disc space flattening with vacuum disc phenomenon mild anterior and mild posterior spurring at L3-L4 level. IMPRESSION: 1. In axial image 65 there is focal thickening of colonic wall and diverticular wall in proximal sigmoid colon. There is stranding of pericolonic fat. Findings are consistent with acute diverticulitis or focal colitis. No evidence of diverticular abscess or perforation. No distal colonic obstruction. No extravasation of oral contrast material. Mild redundant sigmoid colon. Coronal image 33 there is a second focus of mild thickening of colonic wall in sigmoid colon proximally with mild stranding of surrounding fat. Findings suspicious for a second focus of diverticulitis. 2. No hydronephrosis or hydroureter. 3. No pericecal inflammation.  Status post appendectomy. 4. A distended gallbladder is noted without evidence of calcified gallstones. 5. Moderate distended urinary bladder. 6. Degenerative changes lumbar spine at  L3-L4 level. Electronically Signed   By: Tara MeadLiviu  [name] M.D.   On: 01/17/2016 17:10   Mm Digital Screening Bilateral  01/13/2016  CLINICAL DATA:  Screening. EXAM: DIGITAL SCREENING BILATERAL MAMMOGRAM WITH CAD COMPARISON:  Previous exam(s). ACR Breast Density Category b: There are scattered areas of fibroglandular density. FINDINGS: In the left breast, calcifications warrant further evaluation. In the right breast, no findings suspicious for malignancy. Images were processed with CAD. IMPRESSION: Further evaluation is suggested for calcifications in the left breast. RECOMMENDATION: Diagnostic mammogram of the left breast. (Code:[id number]) The patient will be contacted regarding the findings, and additional imaging will be scheduled. BI-RADS CATEGORY  0: Incomplete. Need additional imaging evaluation and/or prior mammograms for comparison. Electronically Signed   By: Beckie SaltsSteven  Reid M.D.   On: 01/13/2016 07:59       Renne CriglerJoshua Khayla Koppenhaver, PA-C 01/17/16 1726  Lyndal Pulleyaniel Knott, MD 01/17/16 [phone]

## 2016-01-17 NOTE — ED Notes (Signed)
Left lower quadrant pain off and on x 2 weeks. She has a hx of diverticulitis and this feels the same.

## 2016-01-18 ENCOUNTER — Ambulatory Visit (INDEPENDENT_AMBULATORY_CARE_PROVIDER_SITE_OTHER): Payer: 59 | Admitting: Neurology

## 2016-01-18 ENCOUNTER — Encounter: Payer: Self-pay | Admitting: Neurology

## 2016-01-18 VITALS — BP 98/58 | HR 74 | Resp 16 | Ht 67.0 in | Wt 170.0 lb

## 2016-01-18 DIAGNOSIS — R0683 Snoring: Secondary | ICD-10-CM | POA: Diagnosis not present

## 2016-01-18 DIAGNOSIS — R51 Headache: Secondary | ICD-10-CM | POA: Diagnosis not present

## 2016-01-18 DIAGNOSIS — R413 Other amnesia: Secondary | ICD-10-CM

## 2016-01-18 DIAGNOSIS — R404 Transient alteration of awareness: Secondary | ICD-10-CM

## 2016-01-18 DIAGNOSIS — G4710 Hypersomnia, unspecified: Secondary | ICD-10-CM

## 2016-01-18 DIAGNOSIS — R519 Headache, unspecified: Secondary | ICD-10-CM

## 2016-01-18 DIAGNOSIS — R400 Somnolence: Secondary | ICD-10-CM

## 2016-01-18 HISTORY — DX: Other amnesia: R41.3

## 2016-01-18 NOTE — Patient Instructions (Signed)
Remember to drink plenty of fluid, eat healthy meals and do not skip any meals. Try to eat protein with a every meal and eat a healthy snack such as fruit or nuts in between meals. Try to keep a regular sleep-wake schedule and try to exercise daily, particularly in the form of walking, 20-30 minutes a day, if you can.   As far as diagnostic testing: MRI brain, eeg, labs. If still having difficulty suggest neurocognitive testing.   I would like to see you back in 4-6 months if needed, sooner if we need to. Please call us with any interim questions, concerns, problems, updates or refill requests.   Our phone number is [phone]. We also have an after hours call service for urgent matters and there is a physician on-call for urgent questions. For any emergencies you know to call 911 or go to the nearest emergency room

## 2016-01-18 NOTE — Progress Notes (Addendum)
[facility]    Provider:  Dr Lucia GaskinsAhern Referring Provider: [name], [name], MD Primary Care Physician:  Allean FoundSMITH,[name], MD  CC:  Memory problems  HPI:  Tara [name] is a 55 y.o. female here as a referral from Dr. Katrinka BlazingSmith for memory problems. Patient was referred to us for new onset of memory changes, trouble remembering the past and also having trouble with focus. She has a past medical history major depression, arthropathy, insomnia, attention deficit disorder, anxiety, memory changes. She has depression and thinks maybe the medication is affecting her. She can't remember memories of her kids growing up, she gets lost going on and she misses turns, she stops at intersections and doesn;t know where she is. Ongoing for years, getting worse and she is "missing blocks". She misses periods of time, she doesn't remember getting to work. No Fhx of dementia or seizures. The driving issues are new within the last year. Her depression is not well controlled and she is having a difficult time right now. She can't remember last names. She is trying to find a psychiatrist to help manage her medications. No personality changes, hallucinations, delusions.   Reviewed notes, labs and imaging from outside physicians, which showed: Labs include normal CBC from April 2016, normal CMP with creatinine of 0.96 also in April 2016, LDL 106.  Reviewed notes from primary care. Patient is having issues with her mood, she is getting lost driving, she can't focus at work. Patient stopped the duloxetine several weekends ago due to not feeling well and she feels like she is in a fog and then restarted on Monday. It makes her sleepy. She is eating food and spending money which she notices not good. Patient is going to a counselor and they recommended Wellbutrin. Patient had issues with taking Adderall previously. She states that she is concerned about having early Alzheimer's disease. She can't remember things  and with interesting is that her short-term memory is good but his long-term memory that shuffling with. She's tried to find a psychiatrist, she is scared and having trouble at work. Physical exam was normal including general, lungs, heart. She was recently diagnosed with major depressive disorder, and memory changes.  Review of Systems: Patient complains of symptoms per HPI as well as the following symptoms: Fevers chills, weight gain, easy bruising, feeling cold, incontinence, diarrhea, constipation, rash, moles, incontinence, memory loss, depression, anxiety, disinterest in activities. Pertinent negatives per HPI. All others negative.   Social History   Social History  . Marital Status: Legally Separated    Spouse Name: N/A  . Number of Children: 4  . Years of Education: college   Occupational History  . [employer]     Social History Main Topics  . Smoking status: Never Smoker   . Smokeless tobacco: Not on file  . Alcohol Use: 0.0 oz/week    0 Standard drinks or equivalent per week  . Drug Use: No  . Sexual Activity: Not on file   Other Topics Concern  . Not on file   Social History Narrative   Rare caffeine use     Family History  Problem Relation Age of Onset  . COPD Mother   . Hypertension Mother   . Heart failure Father   . Hypertension Father   . Hypertension Sister   . Heart attack Brother   . Hypertension Brother   . Diabetes Maternal Grandmother   . Dementia Neg Hx     Past Medical History  Diagnosis Date  .  Depression   . Asthma   . Diverticulitis   . ADD (attention deficit disorder)   . Psoriasis     Past Surgical History  Procedure Laterality Date  . Knee surgery Right   . Appendectomy      Current Outpatient Prescriptions  Medication Sig Dispense Refill  . buPROPion (WELLBUTRIN XL) 150 MG 24 hr tablet     . ciprofloxacin (CIPRO) 500 MG tablet Take 1 tablet (500 mg total) by mouth 2 (two) times daily. 14 tablet 0  . metroNIDAZOLE (FLAGYL)  500 MG tablet Take 1 tablet (500 mg total) by mouth 3 (three) times daily. 21 tablet 0  . mometasone-formoterol (DULERA) 100-5 MCG/ACT AERO Inhale 2 puffs into the lungs 2 (two) times daily. For Shortness of breath 1 Inhaler 0  . Multiple Vitamin (MULTIVITAMIN) capsule Take 1 capsule by mouth daily.    . [DISCONTINUED] DULoxetine 40 MG CPEP Take 40 mg by mouth at bedtime. For depression 30 capsule 0   No current facility-administered medications for this visit.    Allergies as of 01/18/2016 - Review Complete 01/18/2016  Allergen Reaction Noted  . Codeine Itching 09/27/2011    Vitals: BP 98/58 mmHg  Pulse 74  Resp 16  Ht 5\' 7"  (1.702 m)  Wt 170 lb (77.111 kg)  BMI 26.62 kg/m2  LMP 08/30/2010 Last Weight:  Wt Readings from Last 1 Encounters:  01/18/16 170 lb (77.111 kg)   Last Height:   Ht Readings from Last 1 Encounters:  01/18/16 5\' 7"  (1.702 m)   Physical exam: Exam: Gen: NAD, conversant, well nourised, obese, well groomed                     CV: RRR, no MRG. No Carotid Bruits. No peripheral edema, warm, nontender Eyes: Conjunctivae clear without exudates or hemorrhage  Neuro: Detailed Neurologic Exam  Speech:    Speech is normal; fluent and spontaneous with normal comprehension.  Cognition:    The patient is oriented to person, place, and time;     recent and remote memory intact;     language fluent;     normal attention, concentration,     fund of knowledge  MMSE - Mini Mental State Exam 01/18/2016  Orientation to time 5  Orientation to Place 4  Registration 3  Attention/ Calculation 4  Recall 2  Language- name 2 objects 2  Language- repeat 1  Language- follow 3 step command 3  Language- read & follow direction 1  Write a sentence 1  Copy design 1  Total score 27   Cranial Nerves:    The pupils are equal, round, and reactive to light. The fundi are normal and spontaneous venous pulsations are present. Visual fields are full to finger confrontation.  Extraocular movements are intact. Trigeminal sensation is intact and the muscles of mastication are normal. The face is symmetric. The palate elevates in the midline. Hearing intact. Voice is normal. Shoulder shrug is normal. The tongue has normal motion without fasciculations.   Coordination:    Normal finger to nose and heel to shin. Normal rapid alternating movements.   Gait:    Heel-toe and tandem gait are normal.   Motor Observation:    No asymmetry, no atrophy, and no involuntary movements noted. Tone:    Normal muscle tone.    Posture:    Posture is normal. normal erect    Strength:    Strength is V/V in the upper and lower limbs.  Sensation: intact to LT     Reflex Exam:  DTR's:    Deep tendon reflexes in the upper and lower extremities are normal bilaterally.   Toes:    The toes are downgoing bilaterally.   Clonus:    Clonus is absent.       Assessment/Plan:   55 y.o. female here as a referral from Dr. Katrinka Blazing for memory problems. Patient was referred to [place] for new onset of memory changes, trouble remembering the past and also having trouble with focus.  She has uncontrolled depression, anxiety and ADD and stressors in her life such as separation from husband. Memory changes very unlikely alzheimers or other neucognitive degenerative disease but still need to work up with MRi brain, eeg, labs. Her epworth sleepiness scale is 14/30, she snores, morning headaches, will order a sleep evaluation for OSA as this can also cause subjective memory changes.  epworth sleepiness scale is 14/30 so will order sleep eval. This is likely due to normal cognitive aging in association with depression and anxiety.   Patient is unable to drive, operate heavy machinery, perform activities at heights or participate in water activities until 6 months event free    Naomie Dean, MD  [facility] [address] [place], [place] [address]  Phone  [phone] Fax [phone]

## 2016-01-19 ENCOUNTER — Ambulatory Visit (INDEPENDENT_AMBULATORY_CARE_PROVIDER_SITE_OTHER): Payer: 59 | Admitting: Neurology

## 2016-01-19 ENCOUNTER — Encounter: Payer: Self-pay | Admitting: Neurology

## 2016-01-19 ENCOUNTER — Telehealth: Payer: Self-pay | Admitting: *Deleted

## 2016-01-19 VITALS — BP 122/68 | HR 72 | Resp 16 | Ht 67.0 in | Wt 173.0 lb

## 2016-01-19 DIAGNOSIS — G2581 Restless legs syndrome: Secondary | ICD-10-CM | POA: Diagnosis not present

## 2016-01-19 DIAGNOSIS — R519 Headache, unspecified: Secondary | ICD-10-CM

## 2016-01-19 DIAGNOSIS — R413 Other amnesia: Secondary | ICD-10-CM | POA: Diagnosis not present

## 2016-01-19 DIAGNOSIS — R351 Nocturia: Secondary | ICD-10-CM | POA: Diagnosis not present

## 2016-01-19 DIAGNOSIS — G4710 Hypersomnia, unspecified: Secondary | ICD-10-CM | POA: Diagnosis not present

## 2016-01-19 DIAGNOSIS — R51 Headache: Secondary | ICD-10-CM

## 2016-01-19 DIAGNOSIS — R0683 Snoring: Secondary | ICD-10-CM | POA: Diagnosis not present

## 2016-01-19 NOTE — Telephone Encounter (Signed)
-----   Message from Anson FretAntonia B Ahern, MD sent at 01/19/2016  5:47 PM EDT ----- Labs unremarkable. Her creatinine is slightly elevated but I am not concerned  About that, she should have it followed at pcp. thanks

## 2016-01-19 NOTE — Telephone Encounter (Signed)
Called and spoke to pt about unremarkable labs per Dr Lucia GaskinsAhern. Advised her to make sure she f/u with PCP. She requested I send copy to PCP. Advised I will do this. She verbalized understanding.  Routed results via EPIC to PCP.

## 2016-01-19 NOTE — Patient Instructions (Signed)

## 2016-01-19 NOTE — Progress Notes (Signed)
Subjective:    Patient ID: Tara [name] is a 55 y.o. female.  HPI     Huston Foley, MD, PhD [facility] [address], [address] [address] [place], [place] [address]  Dear Tara [name],   I saw your patient, Tara [name], upon your kind request in my clinic today for initial consultation of her sleep disturbance, in particular, concern for underlying obstructive sleep apnea. The patient is unaccompanied today. As you know, [name] is a 55 year old right-handed woman with an underlying medical history of ADD, diverticulitis, psoriasis, diverticulitis, asthma, depression and overweight state, who reports snoring, morning headaches and excessive daytime somnolence. I reviewed your office note from 01/18/2016. You have seen her for memory loss. Workup is underway including MRI brain, labs and EEG. She reports having had a sleep study about 10 years ago which per her report and recollection was normal. Prior sleep test results are not available for my review today. Her Epworth sleepiness score is 15 out of 24 today, her fatigue score is 35 out of 63. She is separated. She lives alone and has 1 dog. She has 4 grown children. She is a never smoker, drinks alcohol rarely and caffeine rarely. She has had more depression, managed by PCP, but still has residual Sx; denies SI currently.  She had gained weight on Cymbalta and was started on Wellbutrin about 2 weeks ago per her primary care physician. She also has a Veterinary surgeon. She works for a [employer], dispensing as an Environmental health practitioner. She goes to bed between 9:30 and 10 and falls asleep fairly quickly. She does not watch TV in bed. Rise time is around 5:45 AM. She has nocturia about once to twice per night on average. She has frequent morning headaches about 3-4 times a week. Denies migrainous headaches, these are dull achy headaches which improve as the day progresses   Of note, her father had obstructive sleep apnea and used a  CPAP machine. She has a brother and a sister with obstructive sleep apnea, both using CPAP machine. She has had infrequent restless leg symptoms which bother her at the time. She has had difficulty initiating and maintaining sleep when she has restless leg symptoms. She is not sure if she twitches or kicks her leg and her sleep. Her dog sleeps at the foot and of the bed and does not bother her sleep. Of note, she presented to the emergency room on 01/17/2016 with flare up of diverticulitis and was placed on Flagyl. She also noticed a recent flareup in her psoriatic lesions.  Her Past Medical History Is Significant For: Past Medical History  Diagnosis Date  . Depression   . Asthma   . Diverticulitis   . ADD (attention deficit disorder)   . Psoriasis     Her Past Surgical History Is Significant For: Past Surgical History  Procedure Laterality Date  . Knee surgery Right   . Appendectomy      Her Family History Is Significant For: Family History  Problem Relation Age of Onset  . COPD Mother   . Hypertension Mother   . Heart failure Father   . Hypertension Father   . Hypertension Sister   . Heart attack Brother   . Hypertension Brother   . Diabetes Brother   . Diabetes Maternal Grandmother   . Dementia Neg Hx     Her Social History Is Significant For: Social History   Social History  . Marital Status: Legally Separated    Spouse  Name: N/A  . Number of Children: 4  . Years of Education: college   Occupational History  . [employer]     Social History Main Topics  . Smoking status: Never Smoker   . Smokeless tobacco: None  . Alcohol Use: 0.0 oz/week    0 Standard drinks or equivalent per week  . Drug Use: No  . Sexual Activity: Not Asked   Other Topics Concern  . None   Social History Narrative   Rare caffeine use     Her Allergies Are:  Allergies  Allergen Reactions  . Codeine Itching  :   Her Current Medications Are:  Outpatient Encounter Prescriptions as  of 01/19/2016  Medication Sig  . buPROPion (WELLBUTRIN XL) 150 MG 24 hr tablet   . ciprofloxacin (CIPRO) 500 MG tablet Take 1 tablet (500 mg total) by mouth 2 (two) times daily.  . metroNIDAZOLE (FLAGYL) 500 MG tablet Take 1 tablet (500 mg total) by mouth 3 (three) times daily.  . mometasone-formoterol (DULERA) 100-5 MCG/ACT AERO Inhale 2 puffs into the lungs 2 (two) times daily. For Shortness of breath  . Multiple Vitamin (MULTIVITAMIN) capsule Take 1 capsule by mouth daily.   No facility-administered encounter medications on file as of 01/19/2016.  :  Review of Systems:  Out of a complete 14 point review of systems, all are reviewed and negative with the exception of these symptoms as listed below:   Review of Systems  Neurological:       Some trouble staying asleep, snoring, wakes up feeling tired, morning headaches, daytime tiredness, takes naps on the weekends.   Had sleep study about 10 years ago [facility]). Patient thinks that everything came back normal.   Epworth Sleepiness Scale 0= would never doze 1= slight chance of dozing 2= moderate chance of dozing 3= high chance of dozing  Sitting and reading:3 Watching TV:1 Sitting inactive in a public place (ex. Theater or meeting):2 As a passenger in a car for an hour without a break:3 Lying down to rest in the afternoon:3 Sitting and talking to someone:0 Sitting quietly after lunch (no alcohol):2 In a car, while stopped in traffic:1 Total:15   Objective:  Neurologic Exam  Physical Exam Physical Examination:   Filed Vitals:   01/19/16 1533  BP: 122/68  Pulse: 72  Resp: 16    General Examination: The patient is a very pleasant 55 y.o. female in no acute distress. She appears well-developed and well-nourished and well groomed.   HEENT: Normocephalic, atraumatic, pupils are equal, round and reactive to light and accommodation. Funduscopic exam is normal with sharp disc margins noted. Extraocular tracking is good  without limitation to gaze excursion or nystagmus noted. Normal smooth pursuit is noted. Hearing is grossly intact. Face is symmetric with normal facial animation and normal facial sensation. Speech is clear with no dysarthria noted. There is no hypophonia. There is no lip, neck/head, jaw or voice tremor. Neck is supple with full range of passive and active motion. There are no carotid bruits on auscultation. Oropharynx exam reveals: mild mouth dryness, good dental hygiene and mild airway crowding, due to smaller airway entry and tonsils. Mallampati is class II. Tongue protrudes centrally and palate elevates symmetrically. Tonsils are 1+ in size. Neck size is 14.25 inches. She has a Mild overbite. Nasal inspection reveals no significant nasal mucosal bogginess or redness and no septal deviation, has a smaller nasal anatomy.   Chest: Clear to auscultation without wheezing, rhonchi or crackles noted.  Heart:  S1+S2+0, regular and normal without murmurs, rubs or gallops noted.   Abdomen: Soft, non-tender and non-distended with normal bowel sounds appreciated on auscultation.  Extremities: There is no pitting edema in the distal lower extremities bilaterally. Pedal pulses are intact.  Skin: Warm and dry without trophic changes noted. There are no varicose veins. She has patchy psoriatic lesions all across her forearms and distal legs.  Musculoskeletal: exam reveals no obvious joint deformities, tenderness or joint swelling or erythema.   Neurologically:  Mental status: The patient is awake, alert and oriented in all 4 spheres. Her immediate and remote memory, attention, language skills and fund of knowledge are appropriate. There is no evidence of aphasia, agnosia, apraxia or anomia. Speech is clear with normal prosody and enunciation. Thought process is linear. Mood is decreased range and affect is blunted.  Cranial nerves II - XII are as described above under HEENT exam. In addition: shoulder shrug is  normal with equal shoulder height noted. Motor exam: Normal bulk, strength and tone is noted. There is no drift, tremor or rebound. Romberg is negative. Reflexes are 2 to 3+ throughout. Babinski: Toes are flexor bilaterally. Fine motor skills and coordination: intact with normal finger taps, normal hand movements, normal rapid alternating patting, normal foot taps and normal foot agility.  Cerebellar testing: No dysmetria or intention tremor on finger to nose testing. Heel to shin is unremarkable bilaterally. There is no truncal or gait ataxia.  Sensory exam: intact to light touch, pinprick, vibration, temperature sense in the upper and lower extremities.  Gait, station and balance: She stands easily. No veering to one side is noted. No leaning to one side is noted. Posture is age-appropriate and stance is narrow based. Gait shows normal stride length and normal pace. No problems turning are noted. She turns en bloc. Tandem walk is unremarkable.    Assessment and Plan:   In summary, Milinda PointerJill Waheed is a very pleasant 55 y.o.-year old female with an underlying medical history of ADD, diverticulitis, psoriasis, diverticulitis, asthma, depression and overweight state, whose history and physical exam are concerning for obstructive sleep apnea (OSA). In particular, in light of her family history of obstructive sleep apnea and her memory related complaints, it is reasonable to proceed with sleep study testing. In addition, she also reports restless leg symptoms, albeit infrequently. I had a long chat with the patient about my findings and the diagnosis of OSA, its prognosis and treatment options. We talked about medical treatments, surgical interventions and non-pharmacological approaches. I explained in particular the risks and ramifications of untreated moderate to severe OSA, especially with respect to developing cardiovascular disease down the Road, including congestive heart failure, difficult to treat  hypertension, cardiac arrhythmias, or stroke. Even type 2 diabetes has, in part, been linked to untreated OSA. Symptoms of untreated OSA include daytime sleepiness, memory problems, mood irritability and mood disorder such as depression and anxiety, lack of energy, as well as recurrent headaches, especially morning headaches. We talked about trying to maintain a healthy lifestyle in general, as well as the importance of weight control. I encouraged the patient to eat healthy, exercise daily and keep well hydrated, to keep a scheduled bedtime and wake time routine, to not skip any meals and eat healthy snacks in between meals. I advised the patient not to drive when feeling sleepy. I recommended the following at this time: sleep study with potential positive airway pressure titration. (We will score hypopneas at 4% and split the sleep study into diagnostic and  treatment portion, if the estimated. 2 hour AHI is >20/h).   I explained the sleep test procedure to the patient and also outlined possible surgical and non-surgical treatment options of OSA, including the use of a custom-made dental device (which would require a referral to a specialist dentist or oral surgeon), upper airway surgical options, such as pillar implants, radiofrequency surgery, tongue base surgery, and UPPP (which would involve a referral to an ENT surgeon). Rarely, jaw surgery such as mandibular advancement may be considered.  I also explained the CPAP treatment option to the patient, who indicated that she would be willing to try CPAP if the need arises. I explained the importance of being compliant with PAP treatment, not only for insurance purposes but primarily to improve Her symptoms, and for the patient's long term health benefit, including to reduce Her cardiovascular risks. I answered all her questions today and the patient was in agreement. I would like to see her back after the sleep study is completed and encouraged her to call  with any interim questions, concerns, problems or updates.   Thank you very much for allowing me to participate in the care of this nice patient. If I can be of any further assistance to you please do not hesitate to talk to me.   Sincerely,   Huston FoleySaima Kiara Keep, MD, PhD

## 2016-01-20 ENCOUNTER — Ambulatory Visit
Admission: RE | Admit: 2016-01-20 | Discharge: 2016-01-20 | Disposition: A | Discharge status: Home / Self Care | Payer: 59 | Source: Ambulatory Visit | Attending: Family Medicine | Admitting: Family Medicine

## 2016-01-20 DIAGNOSIS — R928 Other abnormal and inconclusive findings on diagnostic imaging of breast: Secondary | ICD-10-CM

## 2016-01-20 LAB — B12 AND FOLATE PANEL
FOLATE: 5.8 ng/mL (ref >3.0)
VITAMIN B 12: 484 pg/mL (ref 211–946)

## 2016-01-20 LAB — BASIC METABOLIC PANEL
BUN/Creatinine Ratio: 15 (ref 9–23)
BUN: 15 mg/dL (ref 6–24)
CO2: 25 mmol/L (ref 18–29)
CREATININE: 1.01 mg/dL — AB (ref 0.57–1.00)
Calcium: 9.3 mg/dL (ref 8.7–10.2)
Chloride: 105 mmol/L (ref 96–106)
GFR, EST AFRICAN AMERICAN: 72 mL/min/{1.73_m2} (ref >59)
GFR, EST NON AFRICAN AMERICAN: 62 mL/min/{1.73_m2} (ref >59)
Glucose: 112 mg/dL — ABNORMAL HIGH (ref 65–99)
Potassium: 4.6 mmol/L (ref 3.5–5.2)
SODIUM: 146 mmol/L — AB (ref 134–144)

## 2016-01-20 LAB — THYROID PANEL WITH TSH
Free Thyroxine Index: 1.4 (ref 1.2–4.9)
T3 Uptake Ratio: 24 % (ref 24–39)
T4 TOTAL: 5.7 ug/dL (ref 4.5–12.0)
TSH: 1.39 u[IU]/mL (ref 0.450–4.500)

## 2016-01-20 LAB — METHYLMALONIC ACID, SERUM: METHYLMALONIC ACID: 119 nmol/L (ref 0–378)

## 2016-01-24 ENCOUNTER — Emergency Department (HOSPITAL_BASED_OUTPATIENT_CLINIC_OR_DEPARTMENT_OTHER): Payer: 59

## 2016-01-24 ENCOUNTER — Encounter (HOSPITAL_BASED_OUTPATIENT_CLINIC_OR_DEPARTMENT_OTHER): Payer: Self-pay | Admitting: *Deleted

## 2016-01-24 ENCOUNTER — Inpatient Hospital Stay (HOSPITAL_BASED_OUTPATIENT_CLINIC_OR_DEPARTMENT_OTHER)
Admission: EM | Admit: 2016-01-24 | Discharge: 2016-01-28 | DRG: 392 | Disposition: A | Discharge status: Home / Self Care | Payer: 59 | Attending: Internal Medicine | Admitting: Internal Medicine

## 2016-01-24 DIAGNOSIS — J45909 Unspecified asthma, uncomplicated: Secondary | ICD-10-CM | POA: Diagnosis present

## 2016-01-24 DIAGNOSIS — R8290 Unspecified abnormal findings in urine: Secondary | ICD-10-CM | POA: Diagnosis present

## 2016-01-24 DIAGNOSIS — K5732 Diverticulitis of large intestine without perforation or abscess without bleeding: Principal | ICD-10-CM | POA: Diagnosis present

## 2016-01-24 DIAGNOSIS — N3000 Acute cystitis without hematuria: Secondary | ICD-10-CM | POA: Diagnosis not present

## 2016-01-24 DIAGNOSIS — Z825 Family history of asthma and other chronic lower respiratory diseases: Secondary | ICD-10-CM

## 2016-01-24 DIAGNOSIS — F411 Generalized anxiety disorder: Secondary | ICD-10-CM | POA: Diagnosis not present

## 2016-01-24 DIAGNOSIS — K219 Gastro-esophageal reflux disease without esophagitis: Secondary | ICD-10-CM | POA: Diagnosis present

## 2016-01-24 DIAGNOSIS — K5792 Diverticulitis of intestine, part unspecified, without perforation or abscess without bleeding: Secondary | ICD-10-CM | POA: Diagnosis present

## 2016-01-24 DIAGNOSIS — Z833 Family history of diabetes mellitus: Secondary | ICD-10-CM

## 2016-01-24 DIAGNOSIS — F332 Major depressive disorder, recurrent severe without psychotic features: Secondary | ICD-10-CM | POA: Diagnosis present

## 2016-01-24 DIAGNOSIS — Z8249 Family history of ischemic heart disease and other diseases of the circulatory system: Secondary | ICD-10-CM

## 2016-01-24 DIAGNOSIS — R079 Chest pain, unspecified: Secondary | ICD-10-CM | POA: Diagnosis present

## 2016-01-24 HISTORY — DX: Unspecified abnormal findings in urine: R82.90

## 2016-01-24 LAB — CBC WITH DIFFERENTIAL/PLATELET
Basophils Absolute: 0 10*3/uL (ref 0.0–0.1)
Basophils Relative: 0 %
EOS ABS: 0.1 10*3/uL (ref 0.0–0.7)
EOS PCT: 1 %
HCT: 40.7 % (ref 36.0–46.0)
HEMOGLOBIN: 13.3 g/dL (ref 12.0–15.0)
LYMPHS ABS: 1.4 10*3/uL (ref 0.7–4.0)
LYMPHS PCT: 14 %
MCH: 30.1 pg (ref 26.0–34.0)
MCHC: 32.7 g/dL (ref 30.0–36.0)
MCV: 92.1 fL (ref 78.0–100.0)
MONOS PCT: 7 %
Monocytes Absolute: 0.7 10*3/uL (ref 0.1–1.0)
NEUTROS PCT: 78 %
Neutro Abs: 7.4 10*3/uL (ref 1.7–7.7)
Platelets: 321 10*3/uL (ref 150–400)
RBC: 4.42 MIL/uL (ref 3.87–5.11)
RDW: 12.9 % (ref 11.5–15.5)
WBC: 9.6 10*3/uL (ref 4.0–10.5)

## 2016-01-24 LAB — LIPASE, BLOOD: LIPASE: 16 U/L (ref 11–51)

## 2016-01-24 LAB — COMPREHENSIVE METABOLIC PANEL
ALK PHOS: 59 U/L (ref 38–126)
ALT: 24 U/L (ref 14–54)
ANION GAP: 6 (ref 5–15)
AST: 31 U/L (ref 15–41)
Albumin: 3.7 g/dL (ref 3.5–5.0)
BUN: 14 mg/dL (ref 6–20)
CALCIUM: 9 mg/dL (ref 8.9–10.3)
CO2: 28 mmol/L (ref 22–32)
CREATININE: 1 mg/dL (ref 0.44–1.00)
Chloride: 105 mmol/L (ref 101–111)
Glucose, Bld: 102 mg/dL — ABNORMAL HIGH (ref 65–99)
Potassium: 4 mmol/L (ref 3.5–5.1)
SODIUM: 139 mmol/L (ref 135–145)
TOTAL PROTEIN: 7.2 g/dL (ref 6.5–8.1)
Total Bilirubin: 0.6 mg/dL (ref 0.3–1.2)

## 2016-01-24 LAB — URINALYSIS, ROUTINE W REFLEX MICROSCOPIC
BILIRUBIN URINE: NEGATIVE
GLUCOSE, UA: NEGATIVE mg/dL
Hgb urine dipstick: NEGATIVE
Ketones, ur: NEGATIVE mg/dL
Nitrite: NEGATIVE
PH: 7.5 (ref 5.0–8.0)
Protein, ur: NEGATIVE mg/dL
SPECIFIC GRAVITY, URINE: 1.018 (ref 1.005–1.030)

## 2016-01-24 LAB — PHOSPHORUS: PHOSPHORUS: 3.2 mg/dL (ref 2.5–4.6)

## 2016-01-24 LAB — URINE MICROSCOPIC-ADD ON

## 2016-01-24 LAB — MAGNESIUM: Magnesium: 2.1 mg/dL (ref 1.7–2.4)

## 2016-01-24 IMAGING — CT CT ABD-PELV W/ CM
2 of 5 series · 16 of 46 positions shown, 18 images · IV contrast (APPLIED)
Comparison: CT abdomen and pelvis [DATE] and [DATE].

CLINICAL DATA: Continued left lower quadrant pain. Status post
recent treatment for diverticulitis seen on CT scan [DATE].

EXAM:
CT ABDOMEN AND PELVIS WITH CONTRAST
TECHNIQUE: Multidetector CT imaging of the abdomen and pelvis was performed
using the standard protocol following bolus administration of
intravenous contrast.
CONTRAST:  100 ml [UM] IOPAMIDOL ([UM]) INJECTION 61%

[Series 2: axial st · axial · 0.98mm/px · z∈[-448,+2]mm · 13 of 102 slices shown, 15 images]
[im 6/102  soft-tissue]
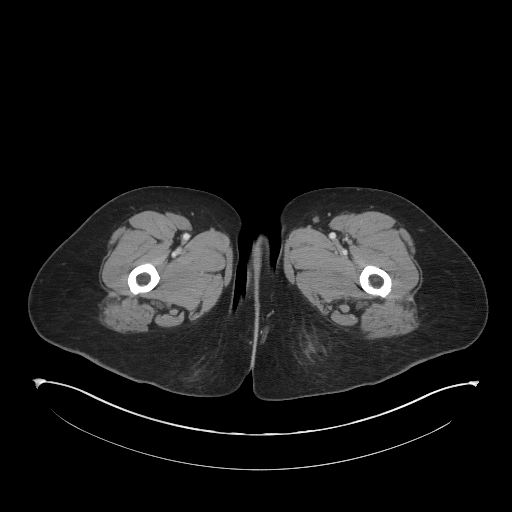
[im 6/102  bone]
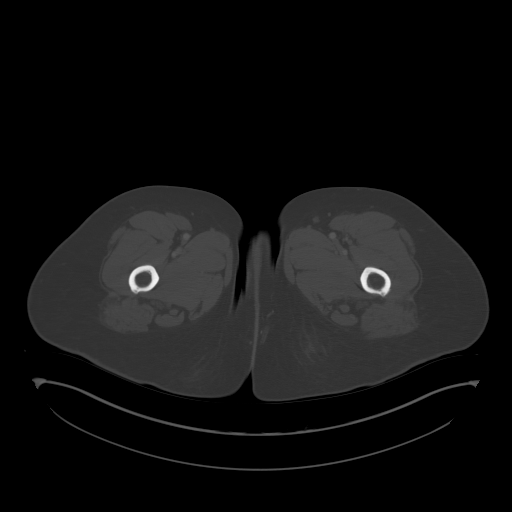
[im 16/102  soft-tissue]
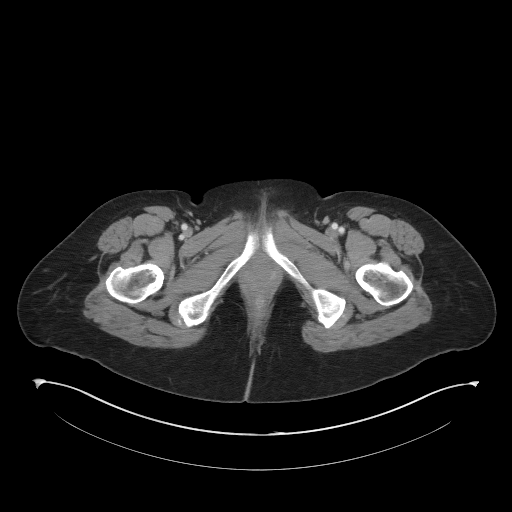
[im 22/102  soft-tissue]
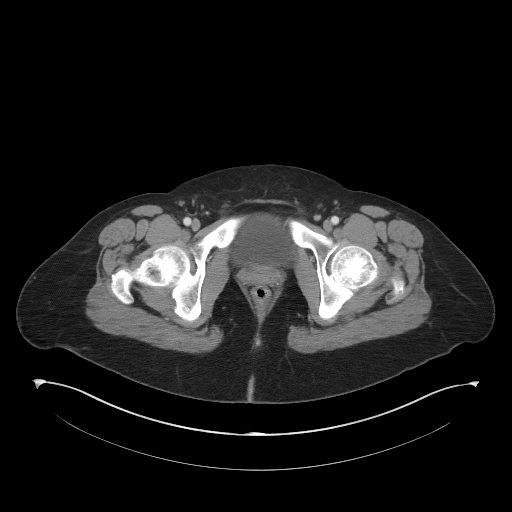
[im 27/102  soft-tissue]
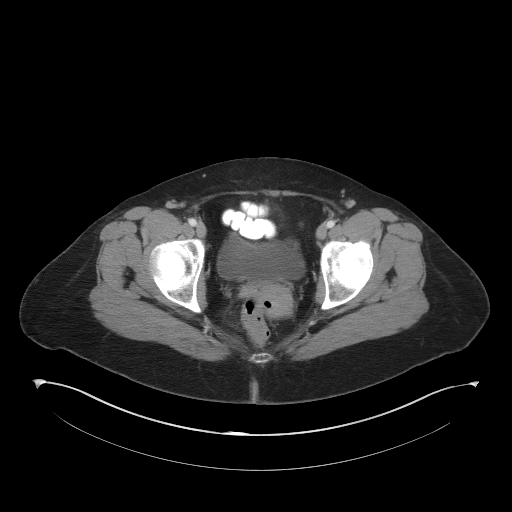
[im 38/102  soft-tissue]
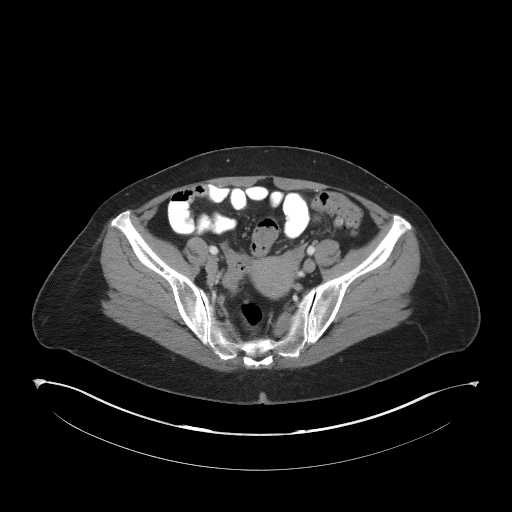
[im 43/102  soft-tissue]
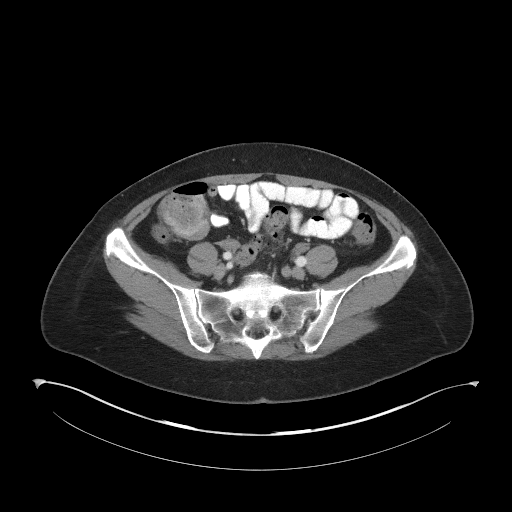
[im 54/102  soft-tissue]
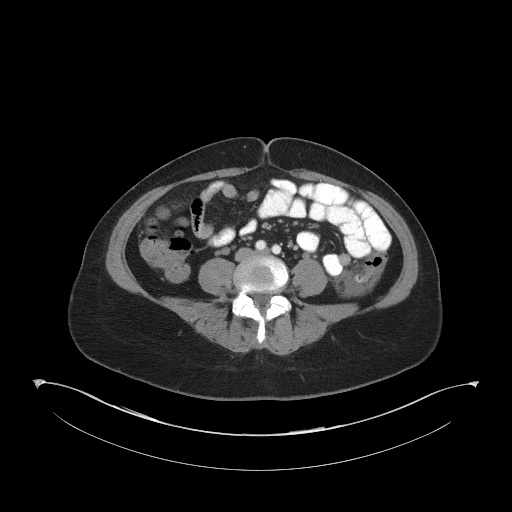
[im 59/102  soft-tissue]
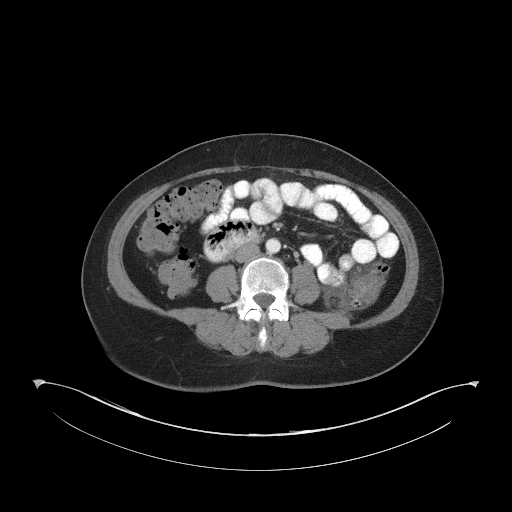
[im 64/102  soft-tissue]
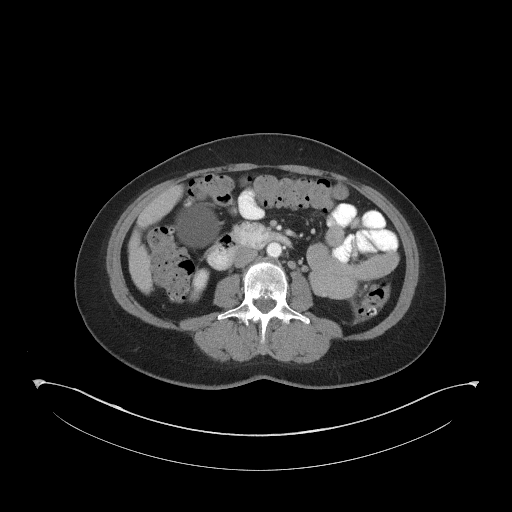
[im 64/102  bone]
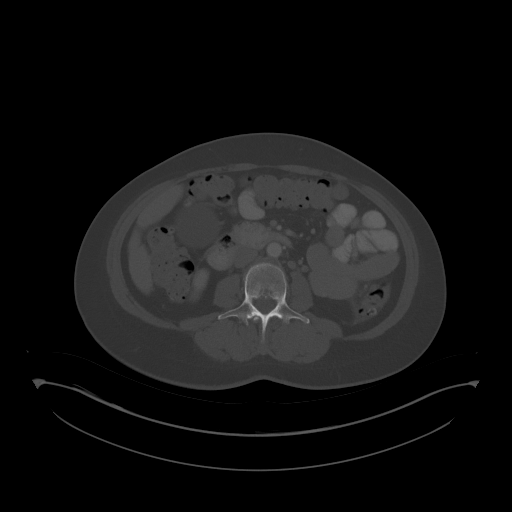
[im 75/102  soft-tissue]
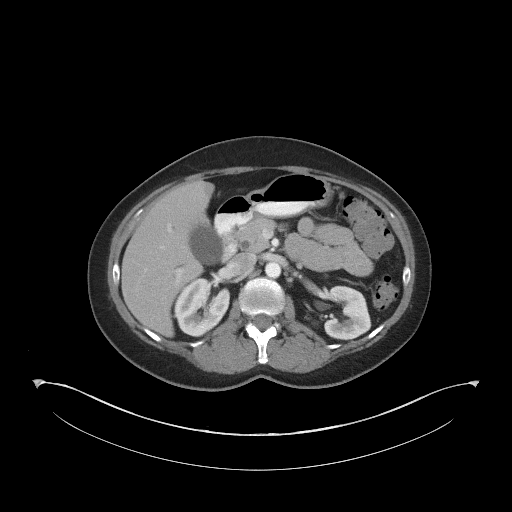
[im 80/102  soft-tissue]
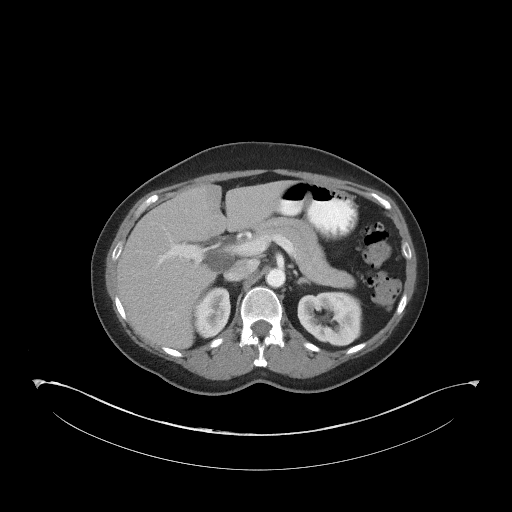
[im 86/102  soft-tissue]
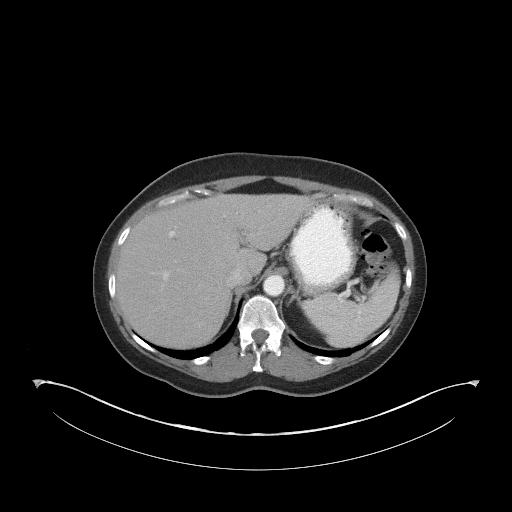
[im 96/102  soft-tissue]
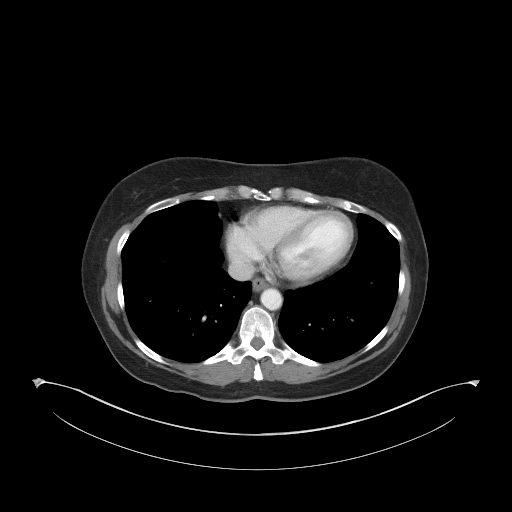

[Series 5: coronal st · coronal · 0.90mm/px · 3 of 97 slices shown]
[im 33/97  soft-tissue]
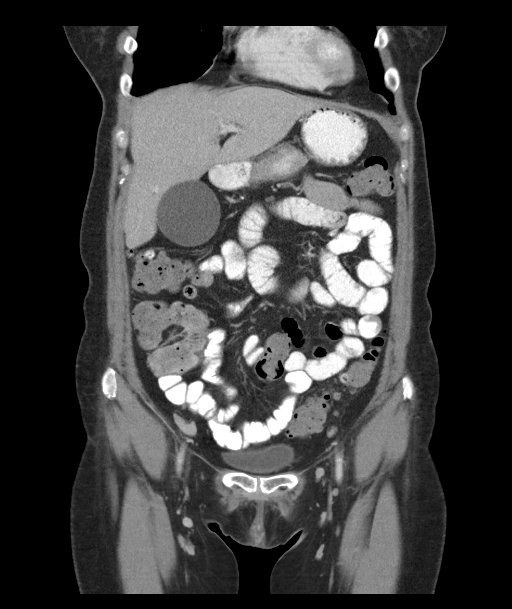
[im 43/97  soft-tissue]
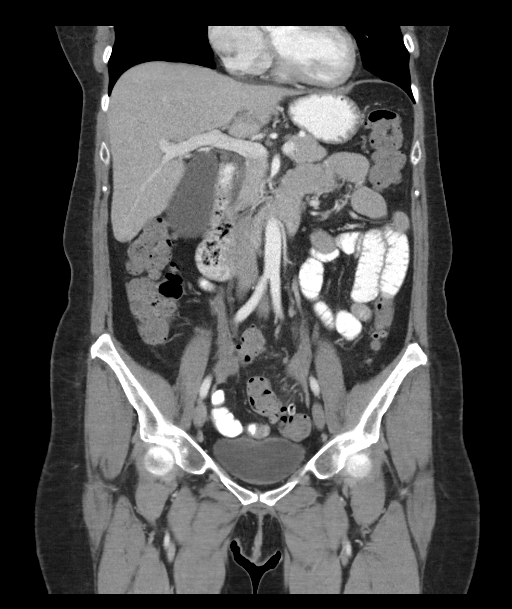
[im 54/97  soft-tissue]
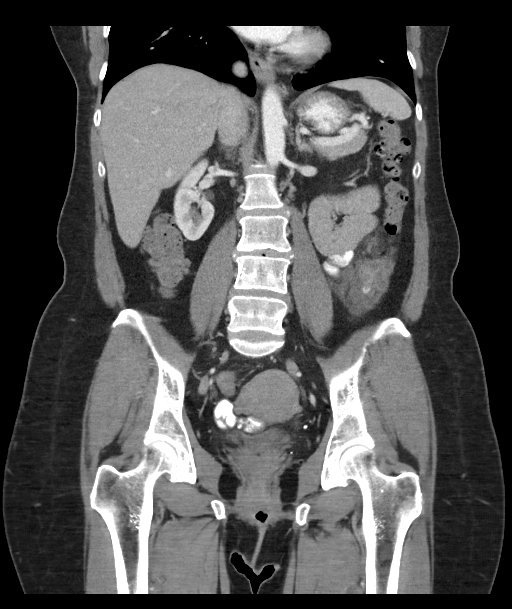

[16 of 46 positions shown; findings below may reference images not displayed]

FINDINGS: Mild dependent atelectasis is seen in the lung bases. No pleural or
pericardial effusion. Very small hiatal hernia is noted.

The gallbladder is somewhat distended, unchanged. No stones or
inflammatory change identified. The liver, adrenal glands, spleen,
kidneys and pancreas appear normal.

Extensive diverticular disease of the colon is again seen. Since the
prior examination, the patient has developed fluid in the left
pericolic gutter. No focal fluid collection is identified.
Inflammatory change about the proximal and mid sigmoid colon is
improved. However, there is new wall thickening in the mid
descending colon with stranding of pericolonic fat. Somewhat
prominent stool burden in the ascending and transverse colon is
noted. No pneumatosis, portal venous gas or free intraperitoneal air
is identified. Uterus, adnexa and urinary bladder appear normal.

No focal bony abnormality is identified.
IMPRESSION: Extensive colonic diverticulosis. Stranding about the proximal mid
sigmoid colon seen on the prior CT scan has resolved. However, there
is new fluid in the left pericolic gutter and wall thickening and
stranding about the mid sigmoid colon worrisome for acute
diverticulitis. No focal fluid collection to suggest abscess is
identified. No perforation.

## 2016-01-24 MED ORDER — KCL IN DEXTROSE-NACL 20-5-0.9 MEQ/L-%-% IV SOLN
INTRAVENOUS | Status: DC
  Administered 2016-01-24 – 2016-01-26 (×4): via INTRAVENOUS
  Filled 2016-01-24 (×8): qty 1000

## 2016-01-24 MED ORDER — ONDANSETRON HCL 4 MG/2ML IJ SOLN: 4.0000 mg | Freq: Four times a day (QID) | INTRAMUSCULAR | Status: DC | PRN

## 2016-01-24 MED ORDER — ACETAMINOPHEN 325 MG PO TABS: 650.0000 mg | ORAL_TABLET | Freq: Four times a day (QID) | ORAL | Status: DC | PRN

## 2016-01-24 MED ORDER — BUPROPION HCL ER (XL) 150 MG PO TB24
150.0000 mg | ORAL_TABLET | Freq: Every day | ORAL | Status: DC
  Administered 2016-01-24 – 2016-01-28 (×5): 150 mg via ORAL
  Filled 2016-01-24 (×6): qty 1

## 2016-01-24 MED ORDER — IOPAMIDOL (ISOVUE-300) INJECTION 61%
100.0000 mL | Freq: Once | INTRAVENOUS | Status: AC | PRN
  Administered 2016-01-24: 100 mL via INTRAVENOUS

## 2016-01-24 MED ORDER — MORPHINE SULFATE (PF) 2 MG/ML IV SOLN
1.0000 mg | INTRAVENOUS | Status: DC | PRN
  Administered 2016-01-24: 2 mg via INTRAVENOUS
  Filled 2016-01-24: qty 1

## 2016-01-24 MED ORDER — FAMOTIDINE IN NACL 20-0.9 MG/50ML-% IV SOLN
20.0000 mg | Freq: Two times a day (BID) | INTRAVENOUS | Status: DC
  Administered 2016-01-24 – 2016-01-28 (×8): 20 mg via INTRAVENOUS
  Filled 2016-01-24 (×9): qty 50

## 2016-01-24 MED ORDER — ENOXAPARIN SODIUM 40 MG/0.4ML ~~LOC~~ SOLN
40.0000 mg | SUBCUTANEOUS | Status: DC
  Filled 2016-01-24: qty 0.4

## 2016-01-24 MED ORDER — ONDANSETRON HCL 4 MG/2ML IJ SOLN
INTRAMUSCULAR | Status: AC
Start: 2016-01-24 — End: 2016-01-24
  Administered 2016-01-24: 8 mg
  Filled 2016-01-24: qty 4

## 2016-01-24 MED ORDER — MOMETASONE FURO-FORMOTEROL FUM 100-5 MCG/ACT IN AERO
2.0000 | INHALATION_SPRAY | Freq: Two times a day (BID) | RESPIRATORY_TRACT | Status: DC
  Filled 2016-01-24 (×2): qty 8.8

## 2016-01-24 MED ORDER — MORPHINE SULFATE (PF) 4 MG/ML IV SOLN
4.0000 mg | Freq: Once | INTRAVENOUS | Status: AC
  Administered 2016-01-24: 4 mg via INTRAVENOUS
  Filled 2016-01-24: qty 1

## 2016-01-24 MED ORDER — PIPERACILLIN-TAZOBACTAM 3.375 G IVPB 30 MIN
3.3750 g | Freq: Once | INTRAVENOUS | Status: AC
  Administered 2016-01-24: 3.375 g via INTRAVENOUS
  Filled 2016-01-24 (×2): qty 50

## 2016-01-24 MED ORDER — ACETAMINOPHEN 650 MG RE SUPP
650.0000 mg | Freq: Four times a day (QID) | RECTAL | Status: DC | PRN
Start: 2016-01-24 — End: 2016-01-28

## 2016-01-24 MED ORDER — ONDANSETRON HCL 40 MG/20ML IJ SOLN
8.0000 mg | Freq: Once | INTRAMUSCULAR | Status: AC
  Administered 2016-01-24: 8 mg via INTRAVENOUS
  Filled 2016-01-24: qty 4

## 2016-01-24 MED ORDER — SODIUM CHLORIDE 0.9 % IV SOLN
3.0000 g | Freq: Three times a day (TID) | INTRAVENOUS | Status: DC
Start: 2016-01-24 — End: 2016-01-28
  Administered 2016-01-24 – 2016-01-28 (×12): 3 g via INTRAVENOUS
  Filled 2016-01-24 (×14): qty 3

## 2016-01-24 MED ORDER — PROCHLORPERAZINE EDISYLATE 5 MG/ML IJ SOLN: 10.0000 mg | Freq: Four times a day (QID) | INTRAMUSCULAR | Status: DC | PRN

## 2016-01-24 MED ORDER — SODIUM CHLORIDE 0.9 % IV BOLUS (SEPSIS)
1000.0000 mL | Freq: Once | INTRAVENOUS | Status: AC
  Administered 2016-01-24: 1000 mL via INTRAVENOUS

## 2016-01-24 MED ORDER — KETOROLAC TROMETHAMINE 15 MG/ML IJ SOLN
15.0000 mg | Freq: Four times a day (QID) | INTRAMUSCULAR | Status: DC
  Administered 2016-01-24 – 2016-01-28 (×15): 15 mg via INTRAVENOUS
  Filled 2016-01-24 (×15): qty 1

## 2016-01-24 NOTE — H&P (Signed)
History and Physical    Tara [name] ZOX:[id number] DOB: Jun 07, 1961 DOA: 01/24/2016   PCP: Allean Found, MD   Patient coming from/Resides with: Private residence/lives with husband  Chief Complaint: Recurrent diverticulitis  HPI: Tara [name] is a 55 y.o. female with medical history significant for asthma, anxiety and severe depression and history of prior episodic diverticulitis who presents to the [facility] ER with symptoms consistent with acute diverticulitis. Patient was initially started on Cipro and Flagyl by her PCP after presenting with symptoms consistent with her prior known history of diverticulitis. CT done at that time revealed stranding of the proximal mid sigmoid colon. Patient's symptoms had markedly improved to the point she was able to return to work for several days but beginning yesterday evening she again to have myalgias and progressive left lower quadrant abdominal pain radiating to the flank. She also noticed that if she touched her abdomen on the right upper side that this would sit a contralateral pain response and left lower quadrant. She has had nausea and dry heaves as well as low-grade fevers 99. She's not had any diarrhea or blood in her stool.  Patient reports a history of several episodes of diverticulitis- typically she presents to the ER and gets IV antibiotics and oral medications to take with complete resolution of symptoms. She had a screening colonoscopy at age 24.  ED Course:  Vital signs: PO temp 99.1-BP 08/04/1965-pulse 78-respirations 18-room air saturations 100% CT abdomen and pelvis with contrast: Extensive colonic diverticulosis. Previous stranding at the proximal mid sigmoid colon resolved; there is new fluid in the left pericolic gutter with associated wall thickening and stranding at the level of the mid sigmoid colon worrisome for acute diverticulitis. There is no focal fluid collection to suggest abscess and no evidence of  perforation. Of note there is no pneumatosis, portal venous gas or free air in the urinary bladder appears normal. Lab data: Sodium 139, potassium 4.0, BUN 14, creatinine 1.00, glucose 102, LFTs are normal, WBC 9600 with neutrophils 78%, absolute neutrophils 7.4%, hemoglobin 13.3, platelets 321,000. Urinalysis abnormal with few bacteria, moderate leukocytes, WBCs 6-30, nitrite negative-urine culture and blood cultures obtained in the ER Medications and treatments: Normal saline bolus 1 L, Zofran 8 mg IV  doses, Zosyn 3.375 g IV 1, morphine 4 mg IV 1  Review of Systems:  In addition to the HPI above,  No Headache, changes with Vision or hearing, new weakness, tingling, numbness in any extremity, No problems swallowing food or Liquids, indigestion/reflux No Chest pain, Cough or Shortness of Breath, palpitations, orthopnea or DOE No melena or hematochezia, no dark tarry stools No dysuria, hematuria or flank pain No new skin rashes, lesions, masses or bruises, No new joints pains-aches No recent weight gain or loss No polyuria, polydypsia or polyphagia,   Past Medical History  Diagnosis Date  . Depression   . Asthma   . Diverticulitis   . ADD (attention deficit disorder)   . Psoriasis     Past Surgical History  Procedure Laterality Date  . Knee surgery Right   . Appendectomy      Social History   Social History  . Marital Status: Legally Separated    Spouse Name: N/A  . Number of Children: 4  . Years of Education: college   Occupational History  . [employer]     Social History Main Topics  . Smoking status: Never Smoker   . Smokeless tobacco: Not on file  . Alcohol Use: 0.0  oz/week    0 Standard drinks or equivalent per week  . Drug Use: No  . Sexual Activity: Not on file   Other Topics Concern  . Not on file   Social History Narrative   Rare caffeine use     Mobility: Without assistive devices Work history: Works as an Recruitment consultant   Allergies  Allergen Reactions  . Codeine Itching    Family History  Problem Relation Age of Onset  . COPD Mother   . Hypertension Mother   . Heart failure Father   . Hypertension Father   . Hypertension Sister   . Heart attack Brother   . Hypertension Brother   . Diabetes Brother   . Diabetes Maternal Grandmother   . Dementia Neg Hx    Family history reviewed and Patient's sister has issues with recurrent particular disease  Prior to Admission medications   Medication Sig Start Date End Date Taking? Authorizing Provider  buPROPion (WELLBUTRIN XL) 150 MG 24 hr tablet  12/28/15   Historical Provider, MD  ciprofloxacin (CIPRO) 500 MG tablet Take 1 tablet (500 mg total) by mouth 2 (two) times daily. 01/17/16   Renne Crigler, PA-C  metroNIDAZOLE (FLAGYL) 500 MG tablet Take 1 tablet (500 mg total) by mouth 3 (three) times daily. 01/17/16   Renne Crigler, PA-C  mometasone-formoterol (DULERA) 100-5 MCG/ACT AERO Inhale 2 puffs into the lungs 2 (two) times daily. For Shortness of breath 10/04/15   Sanjuana Kava, NP  Multiple Vitamin (MULTIVITAMIN) capsule Take 1 capsule by mouth daily.    Historical Provider, MD    Physical Exam: Filed Vitals:   01/24/16 0751 01/24/16 1014 01/24/16 1304 01/24/16 1427  BP: 120/67 113/48 112/53 129/56  Pulse: 78 68 70 67  Temp: 99.1 F (37.3 C) 99.9 F (37.7 C)  98.9 F (37.2 C)  TempSrc: Oral Oral  Oral  Resp: Height:  (1.702 m)    (1.702 m)  Weight: 172 lb (78.019 kg)   179 lb 1.6 oz (81.239 kg)  SpO2: 100% 98% 100% 100%      Constitutional: NAD, calm, comfortable Eyes: PERRL, lids and conjunctivae normal ENMT: Mucous membranes are moist. Posterior pharynx clear of any exudate or lesions.Normal dentition.  Neck: normal, supple, no masses, no thyromegaly Respiratory: clear to auscultation bilaterally, no wheezing, no crackles. Normal respiratory effort. No accessory muscle use.  Cardiovascular: Regular rate and  rhythm, no murmurs / rubs / gallops. No extremity edema. 2+ pedal pulses. No carotid bruits.  Abdomen: Focally tender left lower quadrant with guarding but no rebounding, no masses palpated. No hepatosplenomegaly. Bowel sounds positive hyperactive.  Musculoskeletal: no clubbing / cyanosis. No joint deformity upper and lower extremities. Good ROM, no contractures. Normal muscle tone.  Skin: no rashes, lesions, ulcers. No induration Neurologic: CN 2-12 grossly intact. Sensation intact, DTR normal. Strength 5/5 x all 4 extremities.  Psychiatric: Normal judgment and insight. Alert and oriented x 3. Normal mood.    Labs on Admission: I have personally reviewed following labs and imaging studies  CBC:  Recent Labs Lab 01/24/16 0845  WBC 9.6  NEUTROABS 7.4  HGB 13.3  HCT 40.7  MCV 92.1  PLT 321   Basic Metabolic Panel:  Recent Labs Lab 01/18/16 0816 01/24/16 0845  NA 146* 139  K 4.6 4.0  CL 105 105  CO2 25 28  GLUCOSE 112* 102*  BUN 15 14  CREATININE 1.01* 1.00  CALCIUM 9.3 9.0  GFR: Estimated Creatinine Clearance: 68.8 mL/min (by C-G formula based on Cr of 1). Liver Function Tests:  Recent Labs Lab 01/24/16 0845  AST 31  ALT 24  ALKPHOS 59  BILITOT 0.6  PROT 7.2  ALBUMIN 3.7    Recent Labs Lab 01/24/16 0845  LIPASE 16   No results for input(s): AMMONIA in the last 168 hours. Coagulation Profile: No results for input(s): INR, PROTIME in the last 168 hours. Cardiac Enzymes: No results for input(s): CKTOTAL, CKMB, CKMBINDEX, TROPONINI in the last 168 hours. BNP (last 3 results) No results for input(s): PROBNP in the last 8760 hours. HbA1C: No results for input(s): HGBA1C in the last 72 hours. CBG: No results for input(s): GLUCAP in the last 168 hours. Lipid Profile: No results for input(s): CHOL, HDL, LDLCALC, TRIG, CHOLHDL, LDLDIRECT in the last 72 hours. Thyroid Function Tests: No results for input(s): TSH, T4TOTAL, FREET4, T3FREE, THYROIDAB in the  last 72 hours. Anemia Panel: No results for input(s): VITAMINB12, FOLATE, FERRITIN, TIBC, IRON, RETICCTPCT in the last 72 hours. Urine analysis:    Component Value Date/Time   COLORURINE YELLOW 01/24/2016 0845   APPEARANCEUR CLEAR 01/24/2016 0845   LABSPEC 1.018 01/24/2016 0845   PHURINE 7.5 01/24/2016 0845   GLUCOSEU NEGATIVE 01/24/2016 0845   HGBUR NEGATIVE 01/24/2016 0845   BILIRUBINUR NEGATIVE 01/24/2016 0845   KETONESUR NEGATIVE 01/24/2016 0845   PROTEINUR NEGATIVE 01/24/2016 0845   UROBILINOGEN 0.2 04/26/2014 1908   NITRITE NEGATIVE 01/24/2016 0845   LEUKOCYTESUR MODERATE* 01/24/2016 0845   Sepsis Labs: @LABRCNTIP (procalcitonin:4,lacticidven:4) ) Recent Results (from the past 240 hour(s))  Blood culture (routine x 2)     Status: None (Preliminary result)   Collection Time: 01/24/16  8:40 AM  Result Value Ref Range Status   Specimen Description BLOOD BLOOD LEFT FOREARM  Final   Special Requests BOTTLES DRAWN AEROBIC AND ANAEROBIC 5CC  Final   Culture   Final    NO GROWTH < 12 HOURS Performed at [facility]    Report Status PENDING  Incomplete  Blood culture (routine x 2)     Status: None (Preliminary result)   Collection Time: 01/24/16  8:50 AM  Result Value Ref Range Status   Specimen Description BLOOD RT HAND  Final   Special Requests BOTTLES DRAWN AEROBIC ONLY  5CC  Final   Culture   Final    NO GROWTH < 12 HOURS Performed at [facility]    Report Status PENDING  Incomplete     Radiological Exams on Admission: Ct Abdomen Pelvis W Contrast  01/24/2016  CLINICAL DATA:  Continued left lower quadrant pain. Status post recent treatment for diverticulitis seen on CT scan 01/17/2016. EXAM: CT ABDOMEN AND PELVIS WITH CONTRAST TECHNIQUE: Multidetector CT imaging of the abdomen and pelvis was performed using the standard protocol following bolus administration of intravenous contrast. CONTRAST:  100 ml ISOVUE-300 IOPAMIDOL (ISOVUE-300) INJECTION 61%  COMPARISON:  CT abdomen and pelvis 01/17/2016 and 04/26/2014. FINDINGS: Mild dependent atelectasis is seen in the lung bases. No pleural or pericardial effusion. Very small hiatal hernia is noted. The gallbladder is somewhat distended, unchanged. No stones or inflammatory change identified. The liver, adrenal glands, spleen, kidneys and pancreas appear normal. Extensive diverticular disease of the colon is again seen. Since the prior examination, the patient has developed fluid in the left pericolic gutter. No focal fluid collection is identified. Inflammatory change about the proximal and mid sigmoid colon is improved. However, there is new wall thickening in the mid  descending colon with stranding of pericolonic fat. Somewhat prominent stool burden in the ascending and transverse colon is noted. No pneumatosis, portal venous gas or free intraperitoneal air is identified. Uterus, adnexa and urinary bladder appear normal. No focal bony abnormality is identified. IMPRESSION: Extensive colonic diverticulosis. Stranding about the proximal mid sigmoid colon seen on the prior CT scan has resolved. However, there is new fluid in the left pericolic gutter and wall thickening and stranding about the mid sigmoid colon worrisome for acute diverticulitis. No focal fluid collection to suggest abscess is identified. No perforation. Electronically Signed   By: Drusilla Kanner M.D.   On: 01/24/2016 10:27     Assessment/Plan Principal Problem:   Diverticulitis of large intestine without perforation or abscess without bleeding -Patient response to outpatient therapy noting previously seen areas of stranding had resolved but now with new area the next sigmoid colon despite current therapy with Cipro and Flagyl PO -Empiric Unasyn -Bowel rest allowing only clear liquids but only began to advance diet and wants pain markedly improved and then would do so slowly starting with liquids then advanced to a low-residue diet -No  evidence of perforation or bleeding so no indication to consult surgery at this juncture -With recurrent episodes of diverticulitis once this episode has "cool down" patient needs to follow-up with gastroenterology for screening colonoscopy -Wants above episode completely resolved need to focus on constipation prevention although patient currently does not report any issues with constipation -Until tolerating solid diet will utilize IV narcotics -Scheduled IV Toradol with H2 blocker -IV anti-emetics  Active Problems:   Abnormal urinalysis -Needs to be monitored closely given location of diverticular disease -CT with contrast without evidence of perforation and no mention of colovesical fistula -Follow up on blood cultures and urine culture -Of note patient has developed abnormal urinalysis despite being on antibiotics prior to admission    Asthma in adult -No wheezing on exam -Continue Dulera    MDD (major depressive disorder), recurrent severe, without psychosis/Generalized anxiety disorder -Continue preadmission Wellbutrin       DVT prophylaxis: Lovenox Code Status: Full Code Family Communication: Husband at bedside Disposition Plan: Anticipate discharge back to preadmission home environment once medically stable Consults called: None  Admission status: Observation/medical floor-anticipate may need to upgrade to inpatient setting if not improved by tomorrow    Tara [name] L. ANP-BC [facility] Pager [phone]   If 7PM-7AM, please contact night-coverage [email or web address] Password [facility]  01/24/2016, 4:48 PM

## 2016-01-24 NOTE — Progress Notes (Signed)
[facility] failed outpt diverticulitis treatment. Sx initially improved but acutely worsened while on ABX. [facility]   Shelly Flattenavid Merrell, MD [facility] 01/24/2016, 11:34 AM

## 2016-01-24 NOTE — ED Notes (Signed)
Report given, pt care transferred to [facility] staff at bedside. Pt is aware of pending transport and inpatient admit to [facility].

## 2016-01-24 NOTE — Progress Notes (Signed)
Pharmacy Antibiotic Note  Tara [name] is a 55 y.o. female admitted on 01/24/2016 with intra-abdominal infection .  Pharmacy has been consulted for Unasyn dosing.  Day #1 of abx for intra-abdominal infection. Failed outpatient treatment with cipro and Flagyl. Afebrile, WBC wnl. SCr stable, CrCl ~4570ml/min.  Plan: Start Unasyn 3g IV Q8 Monitor clinical picture, renal function F/U C&S, abx deescalation / LOT   Height: 5\' 7"  (170.2 cm) Weight: 179 lb 1.6 oz (81.239 kg) IBW/kg (Calculated) : 61.6  Temp (24hrs), Avg:99.3 F (37.4 C), Min:98.9 F (37.2 C), Max:99.9 F (37.7 C)   Recent Labs Lab 01/18/16 0816 01/24/16 0845  WBC  --  9.6  CREATININE 1.01* 1.00    Estimated Creatinine Clearance: 68.8 mL/min (by C-G formula based on Cr of 1).    Allergies  Allergen Reactions  . Codeine Itching    Antimicrobials this admission: Unasyn 7/11 >>   Dose adjustments this admission: n/a  Microbiology results: 7/11 BCx: sent 7/11 UCx: sent   Thank you for allowing pharmacy to be a part of this patient's care.  Enzo BiNathan Daking Westervelt, PharmD, BCPS Clinical Pharmacist Pager [phone] 01/24/2016 4:26 PM

## 2016-01-24 NOTE — ED Provider Notes (Signed)
CSN: [id number]     Arrival date & time 01/24/16  0746 History   First MD Initiated Contact with Patient 01/24/16 [phone]     Chief Complaint  Patient presents with  . Abdominal Pain  . Flank Pain     (Consider location/radiation/quality/duration/timing/severity/associated sxs/prior Treatment) HPI   Diagnosed 7/4 with diverticulitis, was taking abx and feeling better then last night started getting chills, aching, nausea, and left side started hurting.  Pressure left upper side and pain in left lower quadrant. Slowly got worse throught the night.  Too nauseated to try pain medications. Pain 7/10. Last dose of abx is today.   Past Medical History  Diagnosis Date  . Depression   . Asthma   . Diverticulitis   . ADD (attention deficit disorder)   . Psoriasis    Past Surgical History  Procedure Laterality Date  . Knee surgery Right   . Appendectomy     Family History  Problem Relation Age of Onset  . COPD Mother   . Hypertension Mother   . Heart failure Father   . Hypertension Father   . Hypertension Sister   . Heart attack Brother   . Hypertension Brother   . Diabetes Brother   . Diabetes Maternal Grandmother   . Dementia Neg Hx    Social History  Substance Use Topics  . Smoking status: Never Smoker   . Smokeless tobacco: None  . Alcohol Use: 0.0 oz/week    0 Standard drinks or equivalent per week   OB History    No data available     Review of Systems  Constitutional: Positive for fever (feel subjective, home was 99) and chills.  HENT: Negative for sore throat.   Eyes: Negative for visual disturbance.  Respiratory: Negative for cough and shortness of breath.   Cardiovascular: Negative for chest pain.  Gastrointestinal: Positive for nausea, abdominal pain and diarrhea. Negative for vomiting and blood in stool.  Genitourinary: Negative for dysuria and difficulty urinating.  Musculoskeletal: Negative for back pain and neck pain.  Skin: Negative for rash.   Neurological: Positive for headaches. Negative for syncope.      Allergies  Codeine  Home Medications   Prior to Admission medications   Medication Sig Start Date End Date Taking? Authorizing Provider  buPROPion (WELLBUTRIN XL) 150 MG 24 hr tablet Take 150 mg by mouth every morning.  12/28/15  Yes Historical Provider, MD  ciprofloxacin (CIPRO) 500 MG tablet Take 1 tablet (500 mg total) by mouth 2 (two) times daily. 01/17/16  Yes Renne Crigler, PA-C  metroNIDAZOLE (FLAGYL) 500 MG tablet Take 1 tablet (500 mg total) by mouth 3 (three) times daily. 01/17/16  Yes Renne Crigler, PA-C  naproxen sodium (ANAPROX) 220 MG tablet Take 220 mg by mouth 2 (two) times daily as needed (FOR PAIN).   Yes Historical Provider, MD   BP 114/54 mmHg  Pulse 65  Temp(Src) 98.4 F (36.9 C) (Oral)  Resp 18  Ht 5\' 7"  (1.702 m)  Wt 179 lb 1.6 oz (81.239 kg)  BMI 28.04 kg/m2  SpO2 99%  LMP 08/30/2010 Physical Exam  Constitutional: She is oriented to person, place, and time. She appears well-developed and well-nourished. No distress.  HENT:  Head: Normocephalic and atraumatic.  Eyes: Conjunctivae and EOM are normal.  Neck: Normal range of motion.  Cardiovascular: Normal rate, regular rhythm, normal heart sounds and intact distal pulses.  Exam reveals no gallop and no friction rub.   No murmur heard. Pulmonary/Chest: Effort normal  and breath sounds normal. No respiratory distress. She has no wheezes. She has no rales.  Abdominal: She exhibits no distension. There is tenderness (left abdomen). There is guarding.  Musculoskeletal: She exhibits no edema or tenderness.  Neurological: She is alert and oriented to person, place, and time.  Skin: Skin is warm and dry. No rash noted. She is not diaphoretic. No erythema.  Nursing note and vitals reviewed.   ED Course  Procedures (including critical care time) Labs Review Labs Reviewed  COMPREHENSIVE METABOLIC PANEL - Abnormal; Notable for the following:     Glucose, Bld 102 (*)    All other components within normal limits  URINALYSIS, ROUTINE W REFLEX MICROSCOPIC (NOT AT [facility]) - Abnormal; Notable for the following:    Leukocytes, UA MODERATE (*)    All other components within normal limits  URINE MICROSCOPIC-ADD ON - Abnormal; Notable for the following:    Squamous Epithelial / LPF 0-5 (*)    Bacteria, UA FEW (*)    All other components within normal limits  COMPREHENSIVE METABOLIC PANEL - Abnormal; Notable for the following:    Glucose, Bld 101 (*)    Calcium 8.1 (*)    Total Protein 5.9 (*)    Albumin 2.9 (*)    Anion gap 3 (*)    All other components within normal limits  CBC - Abnormal; Notable for the following:    Hemoglobin 11.8 (*)    All other components within normal limits  CULTURE, BLOOD (ROUTINE X 2)  CULTURE, BLOOD (ROUTINE X 2)  URINE CULTURE  CBC WITH DIFFERENTIAL/PLATELET  LIPASE, BLOOD  MAGNESIUM  PHOSPHORUS    Imaging Review Ct Abdomen Pelvis W Contrast  01/24/2016  CLINICAL DATA:  Continued left lower quadrant pain. Status post recent treatment for diverticulitis seen on CT scan 01/17/2016. EXAM: CT ABDOMEN AND PELVIS WITH CONTRAST TECHNIQUE: Multidetector CT imaging of the abdomen and pelvis was performed using the standard protocol following bolus administration of intravenous contrast. CONTRAST:  100 ml ISOVUE-300 IOPAMIDOL (ISOVUE-300) INJECTION 61% COMPARISON:  CT abdomen and pelvis 01/17/2016 and 04/26/2014. FINDINGS: Mild dependent atelectasis is seen in the lung bases. No pleural or pericardial effusion. Very small hiatal hernia is noted. The gallbladder is somewhat distended, unchanged. No stones or inflammatory change identified. The liver, adrenal glands, spleen, kidneys and pancreas appear normal. Extensive diverticular disease of the colon is again seen. Since the prior examination, the patient has developed fluid in the left pericolic gutter. No focal fluid collection is identified. Inflammatory change  about the proximal and mid sigmoid colon is improved. However, there is new wall thickening in the mid descending colon with stranding of pericolonic fat. Somewhat prominent stool burden in the ascending and transverse colon is noted. No pneumatosis, portal venous gas or free intraperitoneal air is identified. Uterus, adnexa and urinary bladder appear normal. No focal bony abnormality is identified. IMPRESSION: Extensive colonic diverticulosis. Stranding about the proximal mid sigmoid colon seen on the prior CT scan has resolved. However, there is new fluid in the left pericolic gutter and wall thickening and stranding about the mid sigmoid colon worrisome for acute diverticulitis. No focal fluid collection to suggest abscess is identified. No perforation. Electronically Signed   By: Drusilla Kannerhomas  Dalessio M.D.   On: 01/24/2016 10:27   I have personally reviewed and evaluated these images and lab results as part of my medical decision-making.   EKG Interpretation None      MDM   Final diagnoses:  Diverticulitis of large intestine  without perforation or abscess without bleeding   55yo female with history of recent diagnosis of diverticulitis, still taking abx with initial improvement, presents with concern for left sided abdominal pain.  CT shows no abscess/perforation but does show another area concerning for diverticulitis. Given failure of outpt therapy, will initiate zosyn and admit for further care. Pt hemodynamically stable.    Alvira Monday, MD 01/25/16 [phone]

## 2016-01-24 NOTE — ED Notes (Signed)
Pt reports being seen here for diverticulitis flare last week, took meds and felt much better, then had sudden onset last night of left lq pain radiating to her left flank area, with low grade temps of 99.2. Pt given specimen cup with instructions for cc urine sample. Pt states she is unable to void at this time, will notify staff when able to provide sample.

## 2016-01-24 NOTE — Care Management Note (Signed)
Case Management Note  Patient Details  Name: Tara [name] MRN: [id number] Date of Birth: 12/03/1960  Subjective/Objective:                 Patient in obs, from home with husband, with recurrent diverticulitis.   Action/Plan:  CM will follow for DC planning.   Expected Discharge Date:                  Expected Discharge Plan:  Home/Self Care  In-House Referral:     Discharge planning Services  CM Consult  Post Acute Care Choice:    Choice offered to:     DME Arranged:    DME Agency:     HH Arranged:    HH Agency:     Status of Service:  In process, will continue to follow  If discussed at Long Length of Stay Meetings, dates discussed:    Additional Comments:  Lawerance SabalDebbie Korey Prashad, RN 01/24/2016, 5:08 PM

## 2016-01-24 NOTE — ED Notes (Signed)
MD at bedside. 

## 2016-01-25 ENCOUNTER — Other Ambulatory Visit: Payer: 59

## 2016-01-25 DIAGNOSIS — K5732 Diverticulitis of large intestine without perforation or abscess without bleeding: Secondary | ICD-10-CM | POA: Diagnosis not present

## 2016-01-25 DIAGNOSIS — K219 Gastro-esophageal reflux disease without esophagitis: Secondary | ICD-10-CM | POA: Diagnosis not present

## 2016-01-25 DIAGNOSIS — J45909 Unspecified asthma, uncomplicated: Secondary | ICD-10-CM

## 2016-01-25 DIAGNOSIS — F411 Generalized anxiety disorder: Secondary | ICD-10-CM | POA: Diagnosis not present

## 2016-01-25 DIAGNOSIS — R8290 Unspecified abnormal findings in urine: Secondary | ICD-10-CM

## 2016-01-25 DIAGNOSIS — R079 Chest pain, unspecified: Secondary | ICD-10-CM

## 2016-01-25 DIAGNOSIS — N3000 Acute cystitis without hematuria: Secondary | ICD-10-CM | POA: Diagnosis not present

## 2016-01-25 DIAGNOSIS — F332 Major depressive disorder, recurrent severe without psychotic features: Secondary | ICD-10-CM

## 2016-01-25 HISTORY — DX: Chest pain, unspecified: R07.9

## 2016-01-25 LAB — COMPREHENSIVE METABOLIC PANEL
ALBUMIN: 2.9 g/dL — AB (ref 3.5–5.0)
ALK PHOS: 57 U/L (ref 38–126)
ALT: 31 U/L (ref 14–54)
ANION GAP: 3 — AB (ref 5–15)
AST: 41 U/L (ref 15–41)
BUN: 6 mg/dL (ref 6–20)
CALCIUM: 8.1 mg/dL — AB (ref 8.9–10.3)
CO2: 26 mmol/L (ref 22–32)
Chloride: 108 mmol/L (ref 101–111)
Creatinine, Ser: 0.89 mg/dL (ref 0.44–1.00)
GFR calc non Af Amer: >60 mL/min (ref >60)
GLUCOSE: 101 mg/dL — AB (ref 65–99)
POTASSIUM: 3.5 mmol/L (ref 3.5–5.1)
Sodium: 137 mmol/L (ref 135–145)
TOTAL PROTEIN: 5.9 g/dL — AB (ref 6.5–8.1)
Total Bilirubin: 0.8 mg/dL (ref 0.3–1.2)

## 2016-01-25 LAB — CBC
HEMATOCRIT: 37.7 % (ref 36.0–46.0)
HEMOGLOBIN: 11.8 g/dL — AB (ref 12.0–15.0)
MCH: 29.3 pg (ref 26.0–34.0)
MCHC: 31.3 g/dL (ref 30.0–36.0)
MCV: 93.5 fL (ref 78.0–100.0)
Platelets: 296 10*3/uL (ref 150–400)
RBC: 4.03 MIL/uL (ref 3.87–5.11)
RDW: 12.9 % (ref 11.5–15.5)
WBC: 6.8 10*3/uL (ref 4.0–10.5)

## 2016-01-25 LAB — URINE CULTURE: CULTURE: NO GROWTH

## 2016-01-25 LAB — TROPONIN I

## 2016-01-25 NOTE — Consult Note (Signed)
[facility] CONSULT Reason for consult: recurrent diverticulitis Referring Physician: [facility]. PCP: Dr. Tamala Julian. Primary G.I.: Dr. Sherlene Shams Tara [name] is an 55 y.o. female.  HPI: she was admitted with recurrent diverticulitis. She has had about 5 episodes of diverticulitis over the past 3 or 4 years requiring antibiotics. She is always responded to antibiotics. She had a colonoscopy 6/11 by Dr. Amedeo Plenty showing pan diverticulosis but particularly heavy in the left colon. She began to have typical symptoms began left-sided pain and was admitted. Her last episode was just a couple weeks ago. Repeat CT scan showed stranding in the sigmoid (collect gutter with no clear abscess. The patient's last episode was just a couple weeks ago when she was treated in the emergency room. She notes that she has irregular bowel habits. She never severely constipated but will have formed firm stools and miss a. day or 2 but then may have soft bowel movement for several days.  Past Medical History  Diagnosis Date  . Depression   . Asthma   . Diverticulitis   . ADD (attention deficit disorder)   . Psoriasis     Past Surgical History  Procedure Laterality Date  . Knee surgery Right   . Appendectomy      Family History  Problem Relation Age of Onset  . COPD Mother   . Hypertension Mother   . Heart failure Father   . Hypertension Father   . Hypertension Sister   . Heart attack Brother   . Hypertension Brother   . Diabetes Brother   . Diabetes Maternal Grandmother   . Dementia Neg Hx     Social History:  reports that she has never smoked. She does not have any smokeless tobacco history on file. She reports that she drinks alcohol. She reports that she does not use illicit drugs.  Allergies:  Allergies  Allergen Reactions  . Codeine Itching    HYDROCODONE AND OXYCODONE ALSO    Medications; Prior to Admission medications   Medication Sig Start Date End Date Taking? Authorizing  Provider  buPROPion (WELLBUTRIN XL) 150 MG 24 hr tablet Take 150 mg by mouth every morning.  12/28/15  Yes Historical Provider, MD  ciprofloxacin (CIPRO) 500 MG tablet Take 1 tablet (500 mg total) by mouth 2 (two) times daily. 01/17/16  Yes Carlisle Cater, PA-C  metroNIDAZOLE (FLAGYL) 500 MG tablet Take 1 tablet (500 mg total) by mouth 3 (three) times daily. 01/17/16  Yes Carlisle Cater, PA-C  naproxen sodium (ANAPROX) 220 MG tablet Take 220 mg by mouth 2 (two) times daily as needed (FOR PAIN).   Yes Historical Provider, MD   . ampicillin-sulbactam (UNASYN) IV  3 g Intravenous Q8H  . buPROPion  150 mg Oral Daily  . enoxaparin (LOVENOX) injection  40 mg Subcutaneous Q24H  . famotidine (PEPCID) IV  20 mg Intravenous Q12H  . ketorolac  15 mg Intravenous Q6H  . mometasone-formoterol  2 puff Inhalation BID   PRN Meds acetaminophen **OR** acetaminophen, morphine injection, ondansetron (ZOFRAN) IV **OR** prochlorperazine Results for orders placed or performed during the hospital encounter of 01/24/16 (from the past 48 hour(s))  Blood culture (routine x 2)     Status: None (Preliminary result)   Collection Time: 01/24/16  8:40 AM  Result Value Ref Range   Specimen Description BLOOD BLOOD LEFT FOREARM    Special Requests BOTTLES DRAWN AEROBIC AND ANAEROBIC 5CC    Culture      NO GROWTH 1 DAY Performed at [facility]  Hospital    Report Status PENDING   CBC with Differential     Status: None   Collection Time: 01/24/16  8:45 AM  Result Value Ref Range   WBC 9.6 4.0 - 10.5 K/uL   RBC 4.42 3.87 - 5.11 MIL/uL   Hemoglobin 13.3 12.0 - 15.0 g/dL   HCT 40.7 36.0 - 46.0 %   MCV 92.1 78.0 - 100.0 fL   MCH 30.1 26.0 - 34.0 pg   MCHC 32.7 30.0 - 36.0 g/dL   RDW 12.9 11.5 - 15.5 %   Platelets 321 150 - 400 K/uL   Neutrophils Relative % 78 %   Neutro Abs 7.4 1.7 - 7.7 K/uL   Lymphocytes Relative 14 %   Lymphs Abs 1.4 0.7 - 4.0 K/uL   Monocytes Relative 7 %   Monocytes Absolute 0.7 0.1 - 1.0 K/uL    Eosinophils Relative 1 %   Eosinophils Absolute 0.1 0.0 - 0.7 K/uL   Basophils Relative 0 %   Basophils Absolute 0.0 0.0 - 0.1 K/uL  Comprehensive metabolic panel     Status: Abnormal   Collection Time: 01/24/16  8:45 AM  Result Value Ref Range   Sodium 139 135 - 145 mmol/L   Potassium 4.0 3.5 - 5.1 mmol/L   Chloride 105 101 - 111 mmol/L   CO2 28 22 - 32 mmol/L   Glucose, Bld 102 (H) 65 - 99 mg/dL   BUN 14 6 - 20 mg/dL   Creatinine, Ser 1.00 0.44 - 1.00 mg/dL   Calcium 9.0 8.9 - 10.3 mg/dL   Total Protein 7.2 6.5 - 8.1 g/dL   Albumin 3.7 3.5 - 5.0 g/dL   AST 31 15 - 41 U/L   ALT 24 14 - 54 U/L   Alkaline Phosphatase 59 38 - 126 U/L   Total Bilirubin 0.6 0.3 - 1.2 mg/dL   GFR calc non Af Amer >60 >60 mL/min   GFR calc Af Amer >60 >60 mL/min    Comment: (NOTE) The eGFR has been calculated using the CKD EPI equation. This calculation has not been validated in all clinical situations. eGFR's persistently <60 mL/min signify possible Chronic Kidney Disease.    Anion gap 6 5 - 15  Lipase, blood     Status: None   Collection Time: 01/24/16  8:45 AM  Result Value Ref Range   Lipase 16 11 - 51 U/L  Urinalysis, Routine w reflex microscopic (not at [facility])     Status: Abnormal   Collection Time: 01/24/16  8:45 AM  Result Value Ref Range   Color, Urine YELLOW YELLOW   APPearance CLEAR CLEAR   Specific Gravity, Urine 1.018 1.005 - 1.030   pH 7.5 5.0 - 8.0   Glucose, UA NEGATIVE NEGATIVE mg/dL   Hgb urine dipstick NEGATIVE NEGATIVE   Bilirubin Urine NEGATIVE NEGATIVE   Ketones, ur NEGATIVE NEGATIVE mg/dL   Protein, ur NEGATIVE NEGATIVE mg/dL   Nitrite NEGATIVE NEGATIVE   Leukocytes, UA MODERATE (A) NEGATIVE  Urine microscopic-add on     Status: Abnormal   Collection Time: 01/24/16  8:45 AM  Result Value Ref Range   Squamous Epithelial / LPF 0-5 (A) NONE SEEN   WBC, UA 6-30 0 - 5 WBC/hpf   RBC / HPF 0-5 0 - 5 RBC/hpf   Bacteria, UA FEW (A) NONE SEEN  Urine culture     Status:  None   Collection Time: 01/24/16  8:45 AM  Result Value Ref Range   Specimen  Description URINE, CLEAN CATCH    Special Requests NONE    Culture NO GROWTH Performed at [facility]     Report Status 01/25/2016 FINAL   Blood culture (routine x 2)     Status: None (Preliminary result)   Collection Time: 01/24/16  8:50 AM  Result Value Ref Range   Specimen Description BLOOD RT HAND    Special Requests BOTTLES DRAWN AEROBIC ONLY  5CC    Culture      NO GROWTH 1 DAY Performed at [facility]    Report Status PENDING   Magnesium     Status: None   Collection Time: 01/24/16  6:38 PM  Result Value Ref Range   Magnesium 2.1 1.7 - 2.4 mg/dL  Phosphorus     Status: None   Collection Time: 01/24/16  6:38 PM  Result Value Ref Range   Phosphorus 3.2 2.5 - 4.6 mg/dL  Comprehensive metabolic panel     Status: Abnormal   Collection Time: 01/25/16  5:57 AM  Result Value Ref Range   Sodium 137 135 - 145 mmol/L   Potassium 3.5 3.5 - 5.1 mmol/L   Chloride 108 101 - 111 mmol/L   CO2 26 22 - 32 mmol/L   Glucose, Bld 101 (H) 65 - 99 mg/dL   BUN 6 6 - 20 mg/dL   Creatinine, Ser 0.89 0.44 - 1.00 mg/dL   Calcium 8.1 (L) 8.9 - 10.3 mg/dL   Total Protein 5.9 (L) 6.5 - 8.1 g/dL   Albumin 2.9 (L) 3.5 - 5.0 g/dL   AST 41 15 - 41 U/L   ALT 31 14 - 54 U/L   Alkaline Phosphatase 57 38 - 126 U/L   Total Bilirubin 0.8 0.3 - 1.2 mg/dL   GFR calc non Af Amer >60 >60 mL/min   GFR calc Af Amer >60 >60 mL/min    Comment: (NOTE) The eGFR has been calculated using the CKD EPI equation. This calculation has not been validated in all clinical situations. eGFR's persistently <60 mL/min signify possible Chronic Kidney Disease.    Anion gap 3 (L) 5 - 15  CBC     Status: Abnormal   Collection Time: 01/25/16  5:57 AM  Result Value Ref Range   WBC 6.8 4.0 - 10.5 K/uL   RBC 4.03 3.87 - 5.11 MIL/uL   Hemoglobin 11.8 (L) 12.0 - 15.0 g/dL   HCT 37.7 36.0 - 46.0 %   MCV 93.5 78.0 - 100.0 fL   MCH  29.3 26.0 - 34.0 pg   MCHC 31.3 30.0 - 36.0 g/dL   RDW 12.9 11.5 - 15.5 %   Platelets 296 150 - 400 K/uL  Troponin I (q 6hr x 3)     Status: None   Collection Time: 01/25/16  2:19 PM  Result Value Ref Range   Troponin I <0.03 <0.03 ng/mL    Ct Abdomen Pelvis W Contrast  01/24/2016  CLINICAL DATA:  Continued left lower quadrant pain. Status post recent treatment for diverticulitis seen on CT scan 01/17/2016. EXAM: CT ABDOMEN AND PELVIS WITH CONTRAST TECHNIQUE: Multidetector CT imaging of the abdomen and pelvis was performed using the standard protocol following bolus administration of intravenous contrast. CONTRAST:  100 ml ISOVUE-300 IOPAMIDOL (ISOVUE-300) INJECTION 61% COMPARISON:  CT abdomen and pelvis 01/17/2016 and 04/26/2014. FINDINGS: Mild dependent atelectasis is seen in the lung bases. No pleural or pericardial effusion. Very small hiatal hernia is noted. The gallbladder is somewhat distended, unchanged. No stones or inflammatory  change identified. The liver, adrenal glands, spleen, kidneys and pancreas appear normal. Extensive diverticular disease of the colon is again seen. Since the prior examination, the patient has developed fluid in the left pericolic gutter. No focal fluid collection is identified. Inflammatory change about the proximal and mid sigmoid colon is improved. However, there is new wall thickening in the mid descending colon with stranding of pericolonic fat. Somewhat prominent stool burden in the ascending and transverse colon is noted. No pneumatosis, portal venous gas or free intraperitoneal air is identified. Uterus, adnexa and urinary bladder appear normal. No focal bony abnormality is identified. IMPRESSION: Extensive colonic diverticulosis. Stranding about the proximal mid sigmoid colon seen on the prior CT scan has resolved. However, there is new fluid in the left pericolic gutter and wall thickening and stranding about the mid sigmoid colon worrisome for acute  diverticulitis. No focal fluid collection to suggest abscess is identified. No perforation. Electronically Signed   By: Inge Rise M.D.   On: 01/24/2016 10:27               Blood pressure 118/56, pulse 67, temperature 98.4 F (36.9 C), temperature source Oral, resp. rate 16, height 5' 7" (1.702 m), weight 81.239 kg (179 lb 1.6 oz), last menstrual period 08/30/2010, SpO2 100 %.  Physical exam:   General-- pleasant white female no acute distress ENT-- nonicteric Neck-- without lymphadenopathy Heart-- regular rate and rhythm without murmurs gallops Lungs-- clear Abdomen-- nondistended with mild tenderness on the left side few bowel sounds present Psych-- alert and oriented   Assessment: 1. Recurrent diverticulitis. Patient has had multiple episodes in these primarily been left-sided.  Plan:  Long discussion with her. She may well benefit from elective colectomy but is not really their own Miralax or other chronic softeners to keep her stool soft. Have discussed all this with her in detail. At this point I would try to get her through this episode and see how she does on chronic Miralax therapy. Able to keep her stool soft and she develops recurrent episode she will definitely need for surgical resection.   EDWARDS JR,JAMES L 01/25/2016, 6:19 PM   This note was created using voice recognition software and minor errors may Have occurred unintentionally. Pager: [phone] If no answer or after hours call [phone]

## 2016-01-25 NOTE — Progress Notes (Signed)
PROGRESS NOTE    Tara [name]  WUJ:[id number] DOB: [birth year] DOA: 01/24/2016 PCP: Allean Found, MD    Brief Narrative:  55 year old female history of generalized anxiety disorder, major depressive disorder, asthma, prior episodes of episodic diverticulitis presented to [facility] ER with abdominal pain and noted to have a recurrent diverticulitis. Patient with history of multiple episodes of diverticulitis usually improved with antibiotics. Patient placed empirically on IV Unasyn.   Assessment & Plan:   Principal Problem:   Diverticulitis of large intestine without perforation or abscess without bleeding Active Problems:   MDD (major depressive disorder), recurrent severe, without psychosis (HCC)   Generalized anxiety disorder   Abnormal urinalysis   Asthma in adult   Acute cystitis without hematuria   Gastroesophageal reflux disease without esophagitis   Pain in the chest   Chest pain  #1 recurrent acute sigmoid diverticulitis Patient presenting with an acute sigmoid diverticulitis after recent treatment for an acute diverticulitis CT abdomen and pelvis showed improvement with prior proximal mid sigmoid colonic diverticulitis however new fluid noted in the left pericolic gutter and wall thickening and stranding about the mid sigmoid colon where some for acute diverticulitis. No abscess noted. No perforation noted. Blood cultures pending. Continue empiric IV Unasyn, pain management, supportive care. Consult with GI for further evaluation and management. Due to patient's multiple episodes of diverticulitis will likely need outpatient follow-up with GI for colonoscopy and also evaluation by general surgery once acute episode has resolved.  #2 left upper quadrant/left chest pain Likely secondary to left pericolic gutter fluid noted. Check a EKG. Cycle cardiac enzymes every 6 hours 3. Continue empiric IV antibiotics, supportive care. Follow.  #3 gastroesophageal reflux  disease IV Pepcid.  #4 major depressive disorder/generalized anxiety disorder Continue Wellbutrin.  #5 asthma Stable. Continue Dulera.  #6 abnormal urinalysis Urine cultures pending. Patient on empiric IV Unasyn.   DVT prophylaxis: Lovenox Code Status: Full Family Communication: Updated patient. No family present. Disposition Plan: Home when abdominal pain has improved and patient tolerating oral antibiotics and oral diet.   Consultants:   None  Procedures:   CT abdomen and pelvis 01/24/2016    Antimicrobials:   IV Unasyn 01/24/2016   Subjective: Patient c/o left lower and left upper quadrant pain/chest pain. No nausea or emesis. Tolerating clears.  O6bjectiveCeasar Mons Vitals:   01/24/16 1427 01/24/16 2205 01/25/16 0555 01/25/16 1442  BP: 129/56 103/32 114/54 118/56  Pulse: 67 67 65 67  Temp: 98.9 F (37.2 C) 99.2 F (37.3 C) 98.4 F (36.9 C) 98.4 F (36.9 C)  TempSrc: Oral Oral Oral Oral  Resp: 16 18 18 16   Height: 5\' 7"  (1.702 m)     Weight: 81.239 kg (179 lb 1.6 oz)     SpO2: 100% 98% 99% 100%    Intake/Output Summary (Last 24 hours) at 01/25/16 1550 Last data filed at 01/25/16 1354  Gross per 24 hour  Intake   1080 ml  Output    600 ml  Net    480 ml   Filed Weights   01/24/16 0751 01/24/16 1427  Weight: 78.019 kg (172 lb) 81.239 kg (179 lb 1.6 oz)    Examination:  General exam: Appears calm and comfortable  Respiratory system: Clear to auscultation. Respiratory effort normal. Cardiovascular system: S1 & S2 heard, RRR. No JVD, murmurs, rubs, gallops or clicks. No pedal edema. Gastrointestinal system: Abdomen is nondistended, soft and Tender to palpation in the left upper quadrant and left lower quadrant. No organomegaly or  masses felt. Normal bowel sounds heard. Central nervous system: Alert and oriented. No focal neurological deficits. Extremities: Symmetric 5 x 5 power. Skin: No rashes, lesions or ulcers Psychiatry: Judgement and insight  appear normal. Mood & affect appropriate.     Data Reviewed: I have personally reviewed following labs and imaging studies  CBC:  Recent Labs Lab 01/24/16 0845 01/25/16 0557  WBC 9.6 6.8  NEUTROABS 7.4  --   HGB 13.3 11.8*  HCT 40.7 37.7  MCV 92.1 93.5  PLT 321 296   Basic Metabolic Panel:  Recent Labs Lab 01/24/16 0845 01/24/16 1838 01/25/16 0557  NA 139  --  137  K 4.0  --  3.5  CL 105  --  108  CO2 28  --  26  GLUCOSE 102*  --  101*  BUN 14  --  6  CREATININE 1.00  --  0.89  CALCIUM 9.0  --  8.1*  MG  --  2.1  --   PHOS  --  3.2  --    GFR: Estimated Creatinine Clearance: 77.3 mL/min (by C-G formula based on Cr of 0.89). Liver Function Tests:  Recent Labs Lab 01/24/16 0845 01/25/16 0557  AST 31 41  ALT 24 31  ALKPHOS 59 57  BILITOT 0.6 0.8  PROT 7.2 5.9*  ALBUMIN 3.7 2.9*    Recent Labs Lab 01/24/16 0845  LIPASE 16   No results for input(s): AMMONIA in the last 168 hours. Coagulation Profile: No results for input(s): INR, PROTIME in the last 168 hours. Cardiac Enzymes:  Recent Labs Lab 01/25/16 1419  TROPONINI <0.03   BNP (last 3 results) No results for input(s): PROBNP in the last 8760 hours. HbA1C: No results for input(s): HGBA1C in the last 72 hours. CBG: No results for input(s): GLUCAP in the last 168 hours. Lipid Profile: No results for input(s): CHOL, HDL, LDLCALC, TRIG, CHOLHDL, LDLDIRECT in the last 72 hours. Thyroid Function Tests: No results for input(s): TSH, T4TOTAL, FREET4, T3FREE, THYROIDAB in the last 72 hours. Anemia Panel: No results for input(s): VITAMINB12, FOLATE, FERRITIN, TIBC, IRON, RETICCTPCT in the last 72 hours. Sepsis Labs: No results for input(s): PROCALCITON, LATICACIDVEN in the last 168 hours.  Recent Results (from the past 240 hour(s))  Blood culture (routine x 2)     Status: None (Preliminary result)   Collection Time: 01/24/16  8:40 AM  Result Value Ref Range Status   Specimen Description BLOOD  BLOOD LEFT FOREARM  Final   Special Requests BOTTLES DRAWN AEROBIC AND ANAEROBIC 5CC  Final   Culture   Final    NO GROWTH 1 DAY Performed at [facility]    Report Status PENDING  Incomplete  Urine culture     Status: None   Collection Time: 01/24/16  8:45 AM  Result Value Ref Range Status   Specimen Description URINE, CLEAN CATCH  Final   Special Requests NONE  Final   Culture NO GROWTH Performed at [facility]   Final   Report Status 01/25/2016 FINAL  Final  Blood culture (routine x 2)     Status: None (Preliminary result)   Collection Time: 01/24/16  8:50 AM  Result Value Ref Range Status   Specimen Description BLOOD RT HAND  Final   Special Requests BOTTLES DRAWN AEROBIC ONLY  5CC  Final   Culture   Final    NO GROWTH 1 DAY Performed at [facility]    Report Status PENDING  Incomplete  Radiology Studies: Ct Abdomen Pelvis W Contrast  01/24/2016  CLINICAL DATA:  Continued left lower quadrant pain. Status post recent treatment for diverticulitis seen on CT scan 01/17/2016. EXAM: CT ABDOMEN AND PELVIS WITH CONTRAST TECHNIQUE: Multidetector CT imaging of the abdomen and pelvis was performed using the standard protocol following bolus administration of intravenous contrast. CONTRAST:  100 ml ISOVUE-300 IOPAMIDOL (ISOVUE-300) INJECTION 61% COMPARISON:  CT abdomen and pelvis 01/17/2016 and 04/26/2014. FINDINGS: Mild dependent atelectasis is seen in the lung bases. No pleural or pericardial effusion. Very small hiatal hernia is noted. The gallbladder is somewhat distended, unchanged. No stones or inflammatory change identified. The liver, adrenal glands, spleen, kidneys and pancreas appear normal. Extensive diverticular disease of the colon is again seen. Since the prior examination, the patient has developed fluid in the left pericolic gutter. No focal fluid collection is identified. Inflammatory change about the proximal and mid sigmoid colon is  improved. However, there is new wall thickening in the mid descending colon with stranding of pericolonic fat. Somewhat prominent stool burden in the ascending and transverse colon is noted. No pneumatosis, portal venous gas or free intraperitoneal air is identified. Uterus, adnexa and urinary bladder appear normal. No focal bony abnormality is identified. IMPRESSION: Extensive colonic diverticulosis. Stranding about the proximal mid sigmoid colon seen on the prior CT scan has resolved. However, there is new fluid in the left pericolic gutter and wall thickening and stranding about the mid sigmoid colon worrisome for acute diverticulitis. No focal fluid collection to suggest abscess is identified. No perforation. Electronically Signed   By: Drusilla Kannerhomas  Dalessio M.D.   On: 01/24/2016 10:27        Scheduled Meds: . ampicillin-sulbactam (UNASYN) IV  3 g Intravenous Q8H  . buPROPion  150 mg Oral Daily  . enoxaparin (LOVENOX) injection  40 mg Subcutaneous Q24H  . famotidine (PEPCID) IV  20 mg Intravenous Q12H  . ketorolac  15 mg Intravenous Q6H  . mometasone-formoterol  2 puff Inhalation BID   Continuous Infusions: . dextrose 5 % and 0.9 % NaCl with KCl 20 mEq/L 100 mL/hr at 01/25/16 0541        Time spent: 35 minutes    THOMPSON,DANIEL, MD [facility] Pager [phone]  If 7PM-7AM, please contact night-coverage [email or web address] Password [email or web address] 01/25/2016, 3:50 PM

## 2016-01-26 DIAGNOSIS — R079 Chest pain, unspecified: Secondary | ICD-10-CM | POA: Diagnosis not present

## 2016-01-26 DIAGNOSIS — F411 Generalized anxiety disorder: Secondary | ICD-10-CM | POA: Diagnosis not present

## 2016-01-26 DIAGNOSIS — K5732 Diverticulitis of large intestine without perforation or abscess without bleeding: Secondary | ICD-10-CM | POA: Diagnosis not present

## 2016-01-26 DIAGNOSIS — R8290 Unspecified abnormal findings in urine: Secondary | ICD-10-CM | POA: Diagnosis not present

## 2016-01-26 LAB — COMPREHENSIVE METABOLIC PANEL
ALK PHOS: 50 U/L (ref 38–126)
ALT: 26 U/L (ref 14–54)
AST: 30 U/L (ref 15–41)
Albumin: 2.7 g/dL — ABNORMAL LOW (ref 3.5–5.0)
Anion gap: 5 (ref 5–15)
CALCIUM: 8.2 mg/dL — AB (ref 8.9–10.3)
CHLORIDE: 111 mmol/L (ref 101–111)
CO2: 23 mmol/L (ref 22–32)
CREATININE: 0.76 mg/dL (ref 0.44–1.00)
GFR calc Af Amer: >60 mL/min (ref >60)
Glucose, Bld: 110 mg/dL — ABNORMAL HIGH (ref 65–99)
Potassium: 3.6 mmol/L (ref 3.5–5.1)
Sodium: 139 mmol/L (ref 135–145)
Total Bilirubin: 0.6 mg/dL (ref 0.3–1.2)
Total Protein: 5.7 g/dL — ABNORMAL LOW (ref 6.5–8.1)

## 2016-01-26 LAB — CBC
HCT: 35.4 % — ABNORMAL LOW (ref 36.0–46.0)
HEMOGLOBIN: 11.4 g/dL — AB (ref 12.0–15.0)
MCH: 29.6 pg (ref 26.0–34.0)
MCHC: 32.2 g/dL (ref 30.0–36.0)
MCV: 91.9 fL (ref 78.0–100.0)
PLATELETS: 255 10*3/uL (ref 150–400)
RBC: 3.85 MIL/uL — AB (ref 3.87–5.11)
RDW: 12.8 % (ref 11.5–15.5)
WBC: 6.6 10*3/uL (ref 4.0–10.5)

## 2016-01-26 LAB — TROPONIN I: Troponin I: <0.03 ng/mL (ref <0.03)

## 2016-01-26 MED ORDER — LORAZEPAM 0.5 MG PO TABS
0.5000 mg | ORAL_TABLET | Freq: Once | ORAL | Status: AC
  Administered 2016-01-26: 0.5 mg via ORAL
  Filled 2016-01-26: qty 1

## 2016-01-26 NOTE — Progress Notes (Signed)
Pt has become very tearful. Pt stated, "The pain is back". Scheduled Toradol given. Pt very scared right now d/t possible surgery. Will page hospitalist regarding this. She has started having pain since diet advanced.

## 2016-01-26 NOTE — Progress Notes (Signed)
[facility] PROGRESS NOTE Subjective patient is clinically feeling much better.  Objective: Vital signs in last 24 hours: Temp:  [98.2 F (36.8 C)-98.8 F (37.1 C)] 98.2 F (36.8 C) (07/13 0501) Pulse Rate:  [62-76] 62 (07/13 0501) Resp:  [16-20] 20 (07/13 0501) BP: (117-124)/(48-56) 117/54 mmHg (07/13 0501) SpO2:  [99 %-100 %] 99 % (07/13 0501) Last BM Date: 01/24/16  Intake/Output from previous day: 07/12 0701 - 07/13 0700 In: 4525 [P.O.:600; I.V.:3675; IV Piggyback:250] Out: 800 [Urine:800] Intake/Output this shift:    PE:  Abdomen-- minimal tenderness improved  Lab Results:  Recent Labs  01/24/16 0845 01/25/16 0557 01/26/16 0312  WBC 9.6 6.8 6.6  HGB 13.3 11.8* 11.4*  HCT 40.7 37.7 35.4*  PLT 321 296 255   BMET  Recent Labs  01/24/16 0845 01/25/16 0557 01/26/16 0312  NA 139 137 139  K 4.0 3.5 3.6  CL 105 108 111  CO2 CREATININE 1.00 0.89 0.76   LFT  Recent Labs  01/24/16 0845 01/25/16 0557 01/26/16 0312  PROT 7.2 5.9* 5.7*  AST 31 41 30  ALT ALKPHOS 59 57 50  BILITOT 0.6 0.8 0.6   PT/INR No results for input(s): LABPROT, INR in the last 72 hours. PANCREAS  Recent Labs  01/24/16 0845  LIPASE 16         Studies/Results: Ct Abdomen Pelvis W Contrast  01/24/2016  CLINICAL DATA:  Continued left lower quadrant pain. Status post recent treatment for diverticulitis seen on CT scan 01/17/2016. EXAM: CT ABDOMEN AND PELVIS WITH CONTRAST TECHNIQUE: Multidetector CT imaging of the abdomen and pelvis was performed using the standard protocol following bolus administration of intravenous contrast. CONTRAST:  100 ml ISOVUE-300 IOPAMIDOL (ISOVUE-300) INJECTION 61% COMPARISON:  CT abdomen and pelvis 01/17/2016 and 04/26/2014. FINDINGS: Mild dependent atelectasis is seen in the lung bases. No pleural or pericardial effusion. Very small hiatal hernia is noted. The gallbladder is somewhat distended, unchanged. No stones  or inflammatory change identified. The liver, adrenal glands, spleen, kidneys and pancreas appear normal. Extensive diverticular disease of the colon is again seen. Since the prior examination, the patient has developed fluid in the left pericolic gutter. No focal fluid collection is identified. Inflammatory change about the proximal and mid sigmoid colon is improved. However, there is new wall thickening in the mid descending colon with stranding of pericolonic fat. Somewhat prominent stool burden in the ascending and transverse colon is noted. No pneumatosis, portal venous gas or free intraperitoneal air is identified. Uterus, adnexa and urinary bladder appear normal. No focal bony abnormality is identified. IMPRESSION: Extensive colonic diverticulosis. Stranding about the proximal mid sigmoid colon seen on the prior CT scan has resolved. However, there is new fluid in the left pericolic gutter and wall thickening and stranding about the mid sigmoid colon worrisome for acute diverticulitis. No focal fluid collection to suggest abscess is identified. No perforation. Electronically Signed   By: Drusilla Kanner M.D.   On: 01/24/2016 10:27    Medications: I have reviewed the patient's current medications.  Assessment/Plan: 1. Recurrent diverticulitis. Patient again has clinically responded to antibiotics. Feel that we could switch her over to oral antibiotics and sent her home soon. Long discussion with her about the use of chronic Miralax to keep her stool soft. She is not had consistently soft stools. With discharge her own Miralax BID and let her adjust the dose at home. I would like to see her back in the office  in about a month after she has completed her oral antibiotics. She develops recurrent diverticulitis again I think she will need surgery. I've explain all this to her in detail.   Takeya Marquis JR,Shresta Risden L 01/26/2016, 9:25 AM  This note was created using voice recognition software. Minor errors  may Have occurred unintentionally.  Pager: [phone] If no answer or after hours call [phone]

## 2016-01-26 NOTE — Progress Notes (Signed)
PROGRESS NOTE    Tara [name]  ZOX:[id number] DOB: Jul 21, 1960 DOA: 01/24/2016 PCP: Allean Found, MD    Brief Narrative:  55 year old female history of generalized anxiety disorder, major depressive disorder, asthma, prior episodes of episodic diverticulitis presented to [facility] with abdominal pain and noted to have a recurrent diverticulitis. Patient with history of multiple episodes of diverticulitis usually improved with antibiotics. Patient placed empirically on IV Unasyn.   Assessment & Plan:   Principal Problem:   Diverticulitis of large intestine without perforation or abscess without bleeding Active Problems:   MDD (major depressive disorder), recurrent severe, without psychosis (HCC)   Generalized anxiety disorder   Abnormal urinalysis   Asthma in adult   Acute cystitis without hematuria   Gastroesophageal reflux disease without esophagitis   Pain in the chest   Chest pain  #1 recurrent acute sigmoid diverticulitis Patient presenting with an acute sigmoid diverticulitis after recent treatment for an acute diverticulitis CT abdomen and pelvis showed improvement with prior proximal mid sigmoid colonic diverticulitis however new fluid noted in the left pericolic gutter and wall thickening and stranding about the mid sigmoid colon where some for acute diverticulitis. No abscess noted. No perforation noted. Blood cultures pending. Patient with clinical improvement and tolerating clear liquids. Will advance to full liquid diet. If tolerates will advance to soft diet for breakfast. Continue empiric IV Unasyn, pain management, supportive care.  GI has been consulted and I recommending MiraLAX to keep stools soft and that patient will benefit from elective colectomy. Patient will need to follow-up with GI in the outpatient setting once acute episode has resolved. If patient develops recurrent episode patient will likely need surgical resection.  Due to patient's  multiple episodes of diverticulitis will likely need outpatient follow-up with GI for colonoscopy and also evaluation by general surgery once acute episode has resolved.  #2 left upper quadrant/left chest pain Likely secondary to left pericolic gutter fluid noted. EKG with no ischemic changes. Cardiac enzymes negative 3. Clinical improvement. Continue empiric IV antibiotics, supportive care. Follow.  #3 gastroesophageal reflux disease IV Pepcid.  #4 major depressive disorder/generalized anxiety disorder Continue Wellbutrin.  #5 asthma Stable. Continue Dulera.  #6 abnormal urinalysis Urine cultures negative.   DVT prophylaxis: Lovenox Code Status: Full Family Communication: Updated patient. No family present. Disposition Plan: Home when abdominal pain has improved and patient tolerating oral antibiotics and oral diet, hopefully in 1-2 days.   Consultants:   None  Procedures:   CT abdomen and pelvis 01/24/2016    Antimicrobials:   IV Unasyn 01/24/2016   Subjective: Patient states abdominal pain improving. No nausea or emesis. Tolerating clears. Patient states have been having multiple loose stools.  O6bjectiveCeasar Mons Vitals:   01/25/16 1442 01/25/16 2226 01/26/16 0501 01/26/16 1457  BP: 118/56 124/48 117/54 116/60  Pulse: 67 76 62 59  Temp: 98.4 F (36.9 C) 98.8 F (37.1 C) 98.2 F (36.8 C) 98.4 F (36.9 C)  TempSrc: Oral Oral Oral Oral  Resp: 16 20 20 17   Height:      Weight:      SpO2: 100% 99% 99% 100%    Intake/Output Summary (Last 24 hours) at 01/26/16 1842 Last data filed at 01/26/16 0700  Gross per 24 hour  Intake   3825 ml  Output    800 ml  Net   3025 ml   Filed Weights   01/24/16 0751 01/24/16 1427  Weight: 78.019 kg (172 lb) 81.239 kg (179 lb 1.6 oz)  Examination:  General exam: Appears calm and comfortable  Respiratory system: Clear to auscultation. Respiratory effort normal. Cardiovascular system: S1 & S2 heard, RRR. No JVD,  murmurs, rubs, gallops or clicks. No pedal edema. Gastrointestinal system: Abdomen is nondistended, soft and less Tender to palpation in the left upper quadrant and left lower quadrant. No organomegaly or masses felt. Normal bowel sounds heard. Central nervous system: Alert and oriented. No focal neurological deficits. Extremities: Symmetric 5 x 5 power. Skin: No rashes, lesions or ulcers Psychiatry: Judgement and insight appear normal. Mood & affect appropriate.     Data Reviewed: I have personally reviewed following labs and imaging studies  CBC:  Recent Labs Lab 01/24/16 0845 01/25/16 0557 01/26/16 0312  WBC 9.6 6.8 6.6  NEUTROABS 7.4  --   --   HGB 13.3 11.8* 11.4*  HCT 40.7 37.7 35.4*  MCV 92.1 93.5 91.9  PLT 321 296 255   Basic Metabolic Panel:  Recent Labs Lab 01/24/16 0845 01/24/16 1838 01/25/16 0557 01/26/16 0312  NA 139  --  137 139  K 4.0  --  3.5 3.6  CL 105  --  108 111  CO2 28  --  26 23  GLUCOSE 102*  --  101* 110*  BUN 14  --  6 <5*  CREATININE 1.00  --  0.89 0.76  CALCIUM 9.0  --  8.1* 8.2*  MG  --  2.1  --   --   PHOS  --  3.2  --   --    GFR: Estimated Creatinine Clearance: 86 mL/min (by C-G formula based on Cr of 0.76). Liver Function Tests:  Recent Labs Lab 01/24/16 0845 01/25/16 0557 01/26/16 0312  AST 31 41 30  ALT 24 31 26   ALKPHOS 59 57 50  BILITOT 0.6 0.8 0.6  PROT 7.2 5.9* 5.7*  ALBUMIN 3.7 2.9* 2.7*    Recent Labs Lab 01/24/16 0845  LIPASE 16   No results for input(s): AMMONIA in the last 168 hours. Coagulation Profile: No results for input(s): INR, PROTIME in the last 168 hours. Cardiac Enzymes:  Recent Labs Lab 01/25/16 1419 01/25/16 1903 01/26/16 0312  TROPONINI <0.03 <0.03 <0.03   BNP (last 3 results) No results for input(s): PROBNP in the last 8760 hours. HbA1C: No results for input(s): HGBA1C in the last 72 hours. CBG: No results for input(s): GLUCAP in the last 168 hours. Lipid Profile: No results  for input(s): CHOL, HDL, LDLCALC, TRIG, CHOLHDL, LDLDIRECT in the last 72 hours. Thyroid Function Tests: No results for input(s): TSH, T4TOTAL, FREET4, T3FREE, THYROIDAB in the last 72 hours. Anemia Panel: No results for input(s): VITAMINB12, FOLATE, FERRITIN, TIBC, IRON, RETICCTPCT in the last 72 hours. Sepsis Labs: No results for input(s): PROCALCITON, LATICACIDVEN in the last 168 hours.  Recent Results (from the past 240 hour(s))  Blood culture (routine x 2)     Status: None (Preliminary result)   Collection Time: 01/24/16  8:40 AM  Result Value Ref Range Status   Specimen Description BLOOD BLOOD LEFT FOREARM  Final   Special Requests BOTTLES DRAWN AEROBIC AND ANAEROBIC 5CC  Final   Culture   Final    NO GROWTH 2 DAYS [facility]    Report Status PENDING  Incomplete  Urine culture     Status: None   Collection Time: 01/24/16  8:45 AM  Result Value Ref Range Status   Specimen Description URINE, CLEAN CATCH  Final   Special Requests NONE  Final  Culture NO GROWTH Performed at [facility]   Final   Report Status 01/25/2016 FINAL  Final  Blood culture (routine x 2)     Status: None (Preliminary result)   Collection Time: 01/24/16  8:50 AM  Result Value Ref Range Status   Specimen Description BLOOD RT HAND  Final   Special Requests BOTTLES DRAWN AEROBIC ONLY  5CC  Final   Culture   Final    NO GROWTH 2 DAYS Performed at [facility]    Report Status PENDING  Incomplete         Radiology Studies: No results found.      Scheduled Meds: . ampicillin-sulbactam (UNASYN) IV  3 g Intravenous Q8H  . buPROPion  150 mg Oral Daily  . enoxaparin (LOVENOX) injection  40 mg Subcutaneous Q24H  . famotidine (PEPCID) IV  20 mg Intravenous Q12H  . ketorolac  15 mg Intravenous Q6H  . mometasone-formoterol  2 puff Inhalation BID   Continuous Infusions: . dextrose 5 % and 0.9 % NaCl with KCl 20 mEq/L 75 mL/hr at 01/26/16 1038        Time  spent: 35 minutes    THOMPSON,DANIEL, MD [facility] Pager [phone]  If 7PM-7AM, please contact night-coverage [email or web address] Password [facility] 01/26/2016, 6:42 PM

## 2016-01-27 DIAGNOSIS — Z825 Family history of asthma and other chronic lower respiratory diseases: Secondary | ICD-10-CM | POA: Diagnosis not present

## 2016-01-27 DIAGNOSIS — R8290 Unspecified abnormal findings in urine: Secondary | ICD-10-CM | POA: Diagnosis not present

## 2016-01-27 DIAGNOSIS — J45909 Unspecified asthma, uncomplicated: Secondary | ICD-10-CM | POA: Diagnosis not present

## 2016-01-27 DIAGNOSIS — Z8249 Family history of ischemic heart disease and other diseases of the circulatory system: Secondary | ICD-10-CM | POA: Diagnosis not present

## 2016-01-27 DIAGNOSIS — J4520 Mild intermittent asthma, uncomplicated: Secondary | ICD-10-CM | POA: Diagnosis not present

## 2016-01-27 DIAGNOSIS — F411 Generalized anxiety disorder: Secondary | ICD-10-CM | POA: Diagnosis not present

## 2016-01-27 DIAGNOSIS — K219 Gastro-esophageal reflux disease without esophagitis: Secondary | ICD-10-CM | POA: Diagnosis not present

## 2016-01-27 DIAGNOSIS — F332 Major depressive disorder, recurrent severe without psychotic features: Secondary | ICD-10-CM | POA: Diagnosis not present

## 2016-01-27 DIAGNOSIS — R079 Chest pain, unspecified: Secondary | ICD-10-CM | POA: Diagnosis not present

## 2016-01-27 DIAGNOSIS — K5732 Diverticulitis of large intestine without perforation or abscess without bleeding: Secondary | ICD-10-CM | POA: Diagnosis not present

## 2016-01-27 DIAGNOSIS — M7989 Other specified soft tissue disorders: Secondary | ICD-10-CM | POA: Diagnosis not present

## 2016-01-27 DIAGNOSIS — Z833 Family history of diabetes mellitus: Secondary | ICD-10-CM | POA: Diagnosis not present

## 2016-01-27 DIAGNOSIS — N3000 Acute cystitis without hematuria: Secondary | ICD-10-CM | POA: Diagnosis not present

## 2016-01-27 LAB — CBC
HEMATOCRIT: 34.4 % — AB (ref 36.0–46.0)
HEMOGLOBIN: 10.9 g/dL — AB (ref 12.0–15.0)
MCH: 29.2 pg (ref 26.0–34.0)
MCHC: 31.7 g/dL (ref 30.0–36.0)
MCV: 92.2 fL (ref 78.0–100.0)
Platelets: 263 10*3/uL (ref 150–400)
RBC: 3.73 MIL/uL — AB (ref 3.87–5.11)
RDW: 12.8 % (ref 11.5–15.5)
WBC: 5.9 10*3/uL (ref 4.0–10.5)

## 2016-01-27 LAB — BASIC METABOLIC PANEL
Anion gap: 5 (ref 5–15)
BUN: <5 mg/dL — ABNORMAL LOW (ref 6–20)
CALCIUM: 8.4 mg/dL — AB (ref 8.9–10.3)
CHLORIDE: 113 mmol/L — AB (ref 101–111)
CO2: 23 mmol/L (ref 22–32)
CREATININE: 0.86 mg/dL (ref 0.44–1.00)
Glucose, Bld: 91 mg/dL (ref 65–99)
Potassium: 4 mmol/L (ref 3.5–5.1)
SODIUM: 141 mmol/L (ref 135–145)

## 2016-01-27 MED ORDER — SODIUM CHLORIDE 0.45 % IV SOLN
INTRAVENOUS | Status: DC
  Administered 2016-01-27 – 2016-01-28 (×2): via INTRAVENOUS

## 2016-01-27 MED ORDER — SODIUM CHLORIDE 0.45 % IV SOLN
INTRAVENOUS | Status: DC
  Filled 2016-01-27 (×2): qty 1000

## 2016-01-27 NOTE — Progress Notes (Signed)
[facility] PROGRESS NOTE Subjective Patient had worsening pain after low residue diet back on clear liquids now feels much better. She thinks this may abandon gas pain felt much better passings gas.  Objective: Vital signs in last 24 hours: Temp:  [98.6 F (37 C)-98.8 F (37.1 C)] 98.6 F (37 C) (07/14 1337) Pulse Rate:  [59-64] 59 (07/14 1337) Resp:  [16-18] 18 (07/14 1337) BP: (107-126)/(59-66) 122/61 mmHg (07/14 1337) SpO2:  [100 %] 100 % (07/14 1337) Last BM Date: 01/27/16  Intake/Output from previous day: 07/13 0701 - 07/14 0700 In: 1049.6 [I.V.:1049.6] Out: 800 [Urine:800] Intake/Output this shift: Total I/O In: 240 [P.O.:240] Out: 2050 [Urine:2050]  PE:  Abdomen-- nondistended good bowel sounds minimal if any tenderness.  Lab Results:  Recent Labs  01/25/16 0557 01/26/16 0312 01/27/16 0523  WBC 6.8 6.6 5.9  HGB 11.8* 11.4* 10.9*  HCT 37.7 35.4* 34.4*  PLT 296 255 263   BMET  Recent Labs  01/25/16 0557 01/26/16 0312 01/27/16 0523  NA 137 139 141  K 3.5 3.6 4.0  CL 108 111 113*  CO2 26 23 23   CREATININE 0.89 0.76 0.86   LFT  Recent Labs  01/25/16 0557 01/26/16 0312  PROT 5.9* 5.7*  AST 41 30  ALT 31 26  ALKPHOS 57 50  BILITOT 0.8 0.6   PT/INR No results for input(s): LABPROT, INR in the last 72 hours. PANCREAS No results for input(s): LIPASE in the last 72 hours.       Studies/Results: No results found.  Medications: I have reviewed the patient's current medications.  Assessment/Plan: 1. Acute diverticulitis. I think she's probably doing fine on antibiotics and will not need surgical resection at this time. We will start her back on full liquids and see how she does with that. She could go home on full liquids and oral Augmentin and follow-up with me in about a month. Have explained again to her she will need to use Miralax to keep her stool soft and slowly advance her diet and avoid high residue food until she is  completely well.   Nanci Lakatos JR,Trishna Cwik L 01/27/2016, 4:57 PM  This note was created using voice recognition software. Minor errors may Have occurred unintentionally.  Pager: [phone] If no answer or after hours call [phone]

## 2016-01-27 NOTE — Progress Notes (Signed)
PROGRESS NOTE    Tara [name]  AVW:[id number] DOB: 1961-02-09 DOA: 01/24/2016 PCP: Allean Found, MD    Brief Narrative:  55 year old female history of generalized anxiety disorder, major depressive disorder, asthma, prior episodes of episodic diverticulitis presented to [facility] ER with abdominal pain and noted to have a recurrent diverticulitis. Patient with history of multiple episodes of diverticulitis usually improved with antibiotics. Patient placed empirically on IV Unasyn.   Assessment & Plan:   Principal Problem:   Diverticulitis of large intestine without perforation or abscess without bleeding Active Problems:   MDD (major depressive disorder), recurrent severe, without psychosis (HCC)   Generalized anxiety disorder   Abnormal urinalysis   Asthma in adult   Acute cystitis without hematuria   Gastroesophageal reflux disease without esophagitis   Pain in the chest   Chest pain  #1 recurrent acute sigmoid diverticulitis Patient presented with an acute sigmoid diverticulitis after recent treatment for an acute diverticulitis CT abdomen and pelvis showed improvement with prior proximal mid sigmoid colonic diverticulitis however new fluid noted in the left pericolic gutter and wall thickening and stranding about the mid sigmoid colon where some for acute diverticulitis. No abscess noted. No perforation noted. Blood cultures pending. Patient with clinical improvement and tolerating clear liquids. Will advance to full liquid diet. If tolerates will advance to soft diet for breakfast. Continue empiric IV Unasyn, pain management, supportive care.  GI has been consulted and recommending MiraLAX to keep stools soft and that patient will benefit from elective colectomy. Patient will need to follow-up with GI in the outpatient setting once acute episode has resolved. If patient develops recurrent episode patient will likely need surgical resection.  Patient noted to be  with significant abdominal pain last night when diet was advanced to a solid diet. Patient's diet has been changed back to clear liquids which she is tolerating. Patient states abdominal pain has improved since changing her diet. Patient has been seen by GI and diet advanced to a full liquid diet. If patient tolerates full liquid diet could possibly discharge on a full liquid diet and patient to advance diet slowly and avoid high residue diet until she is completely improved.  #2 left upper quadrant/left chest pain Likely secondary to left pericolic gutter fluid noted. EKG with no ischemic changes. Cardiac enzymes negative 3. Clinical improvement. Continue empiric IV antibiotics, supportive care. Follow.  #3 gastroesophageal reflux disease IV Pepcid.  #4 major depressive disorder/generalized anxiety disorder Continue Wellbutrin.  #5 asthma Stable. Continue Dulera.  #6 abnormal urinalysis Urine cultures negative.   DVT prophylaxis: Lovenox Code Status: Full Family Communication: Updated patient. No family present. Disposition Plan: Home when abdominal pain has improved and patient tolerating oral antibiotics and oral diet, hopefully in 1-2 days.   Consultants:   Gastroenterology: Dr. Randa Evens 01/25/2016  Procedures:   CT abdomen and pelvis 01/24/2016    Antimicrobials:   IV Unasyn 01/24/2016   Subjective: Patient noted overnight to have significant abdominal pain after diet was advanced to a soft diet. Patient back on clear liquids states abdominal pain has improved this morning and feeling better. No nausea or emesis. Tolerating clear liquids.  O6bjectiveCeasar Mons Vitals:   01/26/16 1457 01/26/16 2154 01/27/16 0511 01/27/16 1337  BP: 116/60 126/66 107/59 122/61  Pulse: 59 64 62 59  Temp: 98.4 F (36.9 C) 98.6 F (37 C) 98.8 F (37.1 C) 98.6 F (37 C)  TempSrc: Oral Oral  Oral  Resp: 17 18 16 18   Height:  Weight:      SpO2: 100% 100% 100% 100%     Intake/Output Summary (Last 24 hours) at 01/27/16 1858 Last data filed at 01/27/16 1841  Gross per 24 hour  Intake 1789.58 ml  Output   3500 ml  Net -1710.42 ml   Filed Weights   01/24/16 0751 01/24/16 1427  Weight: 78.019 kg (172 lb) 81.239 kg (179 lb 1.6 oz)    Examination:  General exam: Appears calm and comfortable  Respiratory system: Clear to auscultation. Respiratory effort normal. Cardiovascular system: S1 & S2 heard, RRR. No JVD, murmurs, rubs, gallops or clicks. No pedal edema. Gastrointestinal system: Abdomen is nondistended, soft and less Tender to palpation in the left upper quadrant and left lower quadrant. No organomegaly or masses felt. Normal bowel sounds heard. Central nervous system: Alert and oriented. No focal neurological deficits. Extremities: Symmetric 5 x 5 power. Skin: No rashes, lesions or ulcers Psychiatry: Judgement and insight appear normal. Mood & affect appropriate.     Data Reviewed: I have personally reviewed following labs and imaging studies  CBC:  Recent Labs Lab 01/24/16 0845 01/25/16 0557 01/26/16 0312 01/27/16 0523  WBC 9.6 6.8 6.6 5.9  NEUTROABS 7.4  --   --   --   HGB 13.3 11.8* 11.4* 10.9*  HCT 40.7 37.7 35.4* 34.4*  MCV 92.1 93.5 91.9 92.2  PLT 321 296 255 263   Basic Metabolic Panel:  Recent Labs Lab 01/24/16 0845 01/24/16 1838 01/25/16 0557 01/26/16 0312 01/27/16 0523  NA 139  --  137 139 141  K 4.0  --  3.5 3.6 4.0  CL 105  --  108 111 113*  CO2 28  --  26 23 23   GLUCOSE 102*  --  101* 110* 91  BUN 14  --  6 <5* <5*  CREATININE 1.00  --  0.89 0.76 0.86  CALCIUM 9.0  --  8.1* 8.2* 8.4*  MG  --  2.1  --   --   --   PHOS  --  3.2  --   --   --    GFR: Estimated Creatinine Clearance: 80 mL/min (by C-G formula based on Cr of 0.86). Liver Function Tests:  Recent Labs Lab 01/24/16 0845 01/25/16 0557 01/26/16 0312  AST 31 41 30  ALT 24 31 26   ALKPHOS 59 57 50  BILITOT 0.6 0.8 0.6  PROT 7.2 5.9*  5.7*  ALBUMIN 3.7 2.9* 2.7*    Recent Labs Lab 01/24/16 0845  LIPASE 16   No results for input(s): AMMONIA in the last 168 hours. Coagulation Profile: No results for input(s): INR, PROTIME in the last 168 hours. Cardiac Enzymes:  Recent Labs Lab 01/25/16 1419 01/25/16 1903 01/26/16 0312  TROPONINI <0.03 <0.03 <0.03   BNP (last 3 results) No results for input(s): PROBNP in the last 8760 hours. HbA1C: No results for input(s): HGBA1C in the last 72 hours. CBG: No results for input(s): GLUCAP in the last 168 hours. Lipid Profile: No results for input(s): CHOL, HDL, LDLCALC, TRIG, CHOLHDL, LDLDIRECT in the last 72 hours. Thyroid Function Tests: No results for input(s): TSH, T4TOTAL, FREET4, T3FREE, THYROIDAB in the last 72 hours. Anemia Panel: No results for input(s): VITAMINB12, FOLATE, FERRITIN, TIBC, IRON, RETICCTPCT in the last 72 hours. Sepsis Labs: No results for input(s): PROCALCITON, LATICACIDVEN in the last 168 hours.  Recent Results (from the past 240 hour(s))  Blood culture (routine x 2)     Status: None (Preliminary result)   Collection  Time: 01/24/16  8:40 AM  Result Value Ref Range Status   Specimen Description BLOOD BLOOD LEFT FOREARM  Final   Special Requests BOTTLES DRAWN AEROBIC AND ANAEROBIC 5CC  Final   Culture   Final    NO GROWTH 3 DAYS Performed at [facility]    Report Status PENDING  Incomplete  Urine culture     Status: None   Collection Time: 01/24/16  8:45 AM  Result Value Ref Range Status   Specimen Description URINE, CLEAN CATCH  Final   Special Requests NONE  Final   Culture NO GROWTH Performed at [facility]   Final   Report Status 01/25/2016 FINAL  Final  Blood culture (routine x 2)     Status: None (Preliminary result)   Collection Time: 01/24/16  8:50 AM  Result Value Ref Range Status   Specimen Description BLOOD RT HAND  Final   Special Requests BOTTLES DRAWN AEROBIC ONLY  5CC  Final   Culture   Final    NO  GROWTH 3 DAYS Performed at [facility]    Report Status PENDING  Incomplete         Radiology Studies: No results found.      Scheduled Meds: . ampicillin-sulbactam (UNASYN) IV  3 g Intravenous Q8H  . buPROPion  150 mg Oral Daily  . enoxaparin (LOVENOX) injection  40 mg Subcutaneous Q24H  . famotidine (PEPCID) IV  20 mg Intravenous Q12H  . ketorolac  15 mg Intravenous Q6H  . mometasone-formoterol  2 puff Inhalation BID   Continuous Infusions: . sodium chloride 75 mL/hr at 01/27/16 1042     LOS: 0 days    Time spent: 35 minutes    THOMPSON,DANIEL, MD [facility] Pager [phone]  If 7PM-7AM, please contact night-coverage [email or web address] Password [facility] 01/27/2016, 6:58 PM

## 2016-01-28 ENCOUNTER — Encounter (HOSPITAL_COMMUNITY): Payer: Self-pay

## 2016-01-28 ENCOUNTER — Inpatient Hospital Stay (HOSPITAL_COMMUNITY)
Admission: EM | Admit: 2016-01-28 | Discharge: 2016-02-01 | DRG: 392 | Disposition: A | Discharge status: Home / Self Care | Payer: 59 | Attending: Internal Medicine | Admitting: Internal Medicine

## 2016-01-28 ENCOUNTER — Emergency Department (HOSPITAL_COMMUNITY): Payer: 59

## 2016-01-28 DIAGNOSIS — K219 Gastro-esophageal reflux disease without esophagitis: Secondary | ICD-10-CM | POA: Diagnosis present

## 2016-01-28 DIAGNOSIS — Z885 Allergy status to narcotic agent status: Secondary | ICD-10-CM

## 2016-01-28 DIAGNOSIS — J45909 Unspecified asthma, uncomplicated: Secondary | ICD-10-CM | POA: Diagnosis present

## 2016-01-28 DIAGNOSIS — I999 Unspecified disorder of circulatory system: Secondary | ICD-10-CM

## 2016-01-28 DIAGNOSIS — K5732 Diverticulitis of large intestine without perforation or abscess without bleeding: Principal | ICD-10-CM | POA: Diagnosis present

## 2016-01-28 DIAGNOSIS — F339 Major depressive disorder, recurrent, unspecified: Secondary | ICD-10-CM | POA: Diagnosis present

## 2016-01-28 DIAGNOSIS — F411 Generalized anxiety disorder: Secondary | ICD-10-CM | POA: Diagnosis present

## 2016-01-28 DIAGNOSIS — K5792 Diverticulitis of intestine, part unspecified, without perforation or abscess without bleeding: Secondary | ICD-10-CM | POA: Diagnosis present

## 2016-01-28 DIAGNOSIS — M7989 Other specified soft tissue disorders: Secondary | ICD-10-CM | POA: Diagnosis not present

## 2016-01-28 DIAGNOSIS — Z79899 Other long term (current) drug therapy: Secondary | ICD-10-CM

## 2016-01-28 DIAGNOSIS — R109 Unspecified abdominal pain: Secondary | ICD-10-CM | POA: Diagnosis not present

## 2016-01-28 DIAGNOSIS — F332 Major depressive disorder, recurrent severe without psychotic features: Secondary | ICD-10-CM | POA: Diagnosis present

## 2016-01-28 DIAGNOSIS — R079 Chest pain, unspecified: Secondary | ICD-10-CM | POA: Diagnosis present

## 2016-01-28 LAB — URINALYSIS, ROUTINE W REFLEX MICROSCOPIC
Bilirubin Urine: NEGATIVE
GLUCOSE, UA: NEGATIVE mg/dL
Hgb urine dipstick: NEGATIVE
Ketones, ur: NEGATIVE mg/dL
LEUKOCYTES UA: NEGATIVE
Nitrite: NEGATIVE
PROTEIN: NEGATIVE mg/dL
SPECIFIC GRAVITY, URINE: 1.013 (ref 1.005–1.030)
pH: 7 (ref 5.0–8.0)

## 2016-01-28 LAB — COMPREHENSIVE METABOLIC PANEL
ALT: 25 U/L (ref 14–54)
ANION GAP: 7 (ref 5–15)
AST: 24 U/L (ref 15–41)
Albumin: 3.2 g/dL — ABNORMAL LOW (ref 3.5–5.0)
Alkaline Phosphatase: 63 U/L (ref 38–126)
BUN: <5 mg/dL — ABNORMAL LOW (ref 6–20)
CHLORIDE: 109 mmol/L (ref 101–111)
CO2: 25 mmol/L (ref 22–32)
CREATININE: 0.97 mg/dL (ref 0.44–1.00)
Calcium: 9.5 mg/dL (ref 8.9–10.3)
Glucose, Bld: 97 mg/dL (ref 65–99)
POTASSIUM: 4 mmol/L (ref 3.5–5.1)
SODIUM: 141 mmol/L (ref 135–145)
Total Bilirubin: 0.4 mg/dL (ref 0.3–1.2)
Total Protein: 7.2 g/dL (ref 6.5–8.1)

## 2016-01-28 LAB — CBC
HEMATOCRIT: 39.6 % (ref 36.0–46.0)
HEMOGLOBIN: 12.7 g/dL (ref 12.0–15.0)
MCH: 29.3 pg (ref 26.0–34.0)
MCHC: 32.1 g/dL (ref 30.0–36.0)
MCV: 91.2 fL (ref 78.0–100.0)
PLATELETS: 371 10*3/uL (ref 150–400)
RBC: 4.34 MIL/uL (ref 3.87–5.11)
RDW: 12.5 % (ref 11.5–15.5)
WBC: 9.6 10*3/uL (ref 4.0–10.5)

## 2016-01-28 LAB — BASIC METABOLIC PANEL
Anion gap: 9 (ref 5–15)
CHLORIDE: 108 mmol/L (ref 101–111)
CO2: 25 mmol/L (ref 22–32)
Calcium: 8.7 mg/dL — ABNORMAL LOW (ref 8.9–10.3)
Creatinine, Ser: 0.8 mg/dL (ref 0.44–1.00)
Glucose, Bld: 89 mg/dL (ref 65–99)
POTASSIUM: 3.7 mmol/L (ref 3.5–5.1)
SODIUM: 142 mmol/L (ref 135–145)

## 2016-01-28 LAB — LIPASE, BLOOD: LIPASE: 19 U/L (ref 11–51)

## 2016-01-28 LAB — I-STAT CG4 LACTIC ACID, ED: Lactic Acid, Venous: 0.73 mmol/L (ref 0.5–1.9)

## 2016-01-28 MED ORDER — TRAMADOL HCL 50 MG PO TABS: 50.0000 mg | ORAL_TABLET | Freq: Four times a day (QID) | ORAL | Status: DC | PRN

## 2016-01-28 MED ORDER — PROCHLORPERAZINE MALEATE 10 MG PO TABS: 10.0000 mg | ORAL_TABLET | Freq: Four times a day (QID) | ORAL | Status: DC | PRN

## 2016-01-28 MED ORDER — HYDROMORPHONE HCL 1 MG/ML IJ SOLN
1.0000 mg | Freq: Once | INTRAMUSCULAR | Status: AC
  Administered 2016-01-28: 1 mg via INTRAVENOUS
  Filled 2016-01-28: qty 1

## 2016-01-28 MED ORDER — AMOXICILLIN-POT CLAVULANATE 875-125 MG PO TABS: 1.0000 | ORAL_TABLET | Freq: Two times a day (BID) | ORAL | Status: AC

## 2016-01-28 MED ORDER — POLYETHYLENE GLYCOL 3350 17 G PO PACK: 17.0000 g | PACK | Freq: Every day | ORAL | Status: DC

## 2016-01-28 MED ORDER — IOPAMIDOL (ISOVUE-300) INJECTION 61%
INTRAVENOUS | Status: AC
  Administered 2016-01-28: 100 mL
  Filled 2016-01-28: qty 100

## 2016-01-28 NOTE — Progress Notes (Signed)
Nsg Discharge Note  Admit Date:  01/24/2016 Discharge date: 01/28/2016   Milinda PointerJill Gauger to be D/C'd Home per MD order.  AVS completed.  Copy for chart, and copy for patient signed, and dated. Patient/caregiver able to verbalize understanding.  Discharge Medication:   Medication List    STOP taking these medications        ciprofloxacin 500 MG tablet  Commonly known as:  CIPRO     metroNIDAZOLE 500 MG tablet  Commonly known as:  FLAGYL      TAKE these medications        amoxicillin-clavulanate 875-125 MG tablet  Commonly known as:  AUGMENTIN  Take 1 tablet by mouth 2 (two) times daily. Take for 10 days then stop.     buPROPion 150 MG 24 hr tablet  Commonly known as:  WELLBUTRIN XL  Take 150 mg by mouth every morning.     naproxen sodium 220 MG tablet  Commonly known as:  ANAPROX  Take 220 mg by mouth 2 (two) times daily as needed (FOR PAIN).     polyethylene glycol packet  Commonly known as:  MIRALAX  Take 17 g by mouth daily. Titrate for soft stools.     prochlorperazine 10 MG tablet  Commonly known as:  COMPAZINE  Take 1 tablet (10 mg total) by mouth every 6 (six) hours as needed for nausea or vomiting.     traMADol 50 MG tablet  Commonly known as:  ULTRAM  Take 1 tablet (50 mg total) by mouth every 6 (six) hours as needed.        Discharge Assessment: Filed Vitals:   01/27/16 2106 01/28/16 0504  BP: 131/57 109/53  Pulse: 55 51  Temp: 98.1 F (36.7 C) 98.5 F (36.9 C)  Resp: 18 18   Skin clean, dry and intact without evidence of skin break down, no evidence of skin tears noted. IV catheter discontinued intact. Site without signs and symptoms of complications - no redness or edema noted at insertion site, patient denies c/o pain - only slight tenderness at site.  Dressing with slight pressure applied.  D/c Instructions-Education: Discharge instructions given to patient/family with verbalized understanding. D/c education completed with patient/family  including follow up instructions, medication list, d/c activities limitations if indicated, with other d/c instructions as indicated by MD - patient able to verbalize understanding, all questions fully answered. Patient instructed to return to ED, call 911, or call MD for any changes in condition.  Patient escorted via WC, and D/C home via private auto.  Kern ReapBrumagin, Courtlyn Aki L, RN 01/28/2016 3:58 PM

## 2016-01-28 NOTE — Progress Notes (Signed)
[facility] Progress Note  Subjective: Patient feels well. Denies abdominal pain.  Objective: Vital signs in last 24 hours: Temp:  [98.1 F (36.7 C)-98.6 F (37 C)] 98.5 F (36.9 C) (07/15 0504) Pulse Rate:  [51-59] 51 (07/15 0504) Resp:  [18] 18 (07/15 0504) BP: (109-131)/(53-61) 109/53 mmHg (07/15 0504) SpO2:  [100 %] 100 % (07/15 0504) Weight change:    PE:  No distress  Abdomen soft nontender  Lab Results: Results for orders placed or performed during the hospital encounter of 01/24/16 (from the past 24 hour(s))  Basic metabolic panel     Status: Abnormal   Collection Time: 01/28/16  6:25 AM  Result Value Ref Range   Sodium 142 135 - 145 mmol/L   Potassium 3.7 3.5 - 5.1 mmol/L   Chloride 108 101 - 111 mmol/L   CO2 25 22 - 32 mmol/L   Glucose, Bld 89 65 - 99 mg/dL   BUN <5 (L) 6 - 20 mg/dL   Creatinine, Ser 1.470.80 0.44 - 1.00 mg/dL   Calcium 8.7 (L) 8.9 - 10.3 mg/dL   GFR calc non Af Amer >60 >60 mL/min   GFR calc Af Amer >60 >60 mL/min   Anion gap 9 5 - 15    Studies/Results: No results found.    Assessment: Diverticulitis. Symptoms improved  Plan:   I think she can go home. Send home on antibiotics. Follow-up with Dr. Randa EvensEdwards in a few weeks.    SAM F Myrle Wanek 01/28/2016, 12:00 PM  Pager: [phone] If no answer or after 5 PM call [phone]

## 2016-01-28 NOTE — ED Notes (Signed)
Pt d/c from this hospital today for diverticulitis.  Abd pain across lower abd and left flank.  pt tearful at triage.  No vomiting/nausea.

## 2016-01-28 NOTE — Discharge Summary (Signed)
Physician Discharge Summary  Tara [name] ZOX:[id number] DOB: 06-Sep-1960 DOA: 01/24/2016  PCP: Allean Found, MD  Admit date: 01/24/2016 Discharge date: 01/28/2016  Time spent: 65 minutes  Recommendations for Outpatient Follow-up:  1. Follow-up with Allean Found, MD in 2 weeks. On follow-up patient will need a basic metabolic profile done to follow-up on electrolytes and renal function as well as a magnesium level. 2. Follow-up with Dr. Randa Evens of [facility] gastroenterology in one month.   Discharge Diagnoses:  Principal Problem:   Diverticulitis of large intestine without perforation or abscess without bleeding Active Problems:   MDD (major depressive disorder), recurrent severe, without psychosis (HCC)   Generalized anxiety disorder   Abnormal urinalysis   Asthma in adult   Acute cystitis without hematuria   Gastroesophageal reflux disease without esophagitis   Pain in the chest   Chest pain   Discharge Condition: Stable and improved  Diet recommendation: Full liquid diet and advance slowly to a soft diet. Avoid high residue diet until completely improved.  Filed Weights   01/24/16 0751 01/24/16 1427  Weight: 78.019 kg (172 lb) 81.239 kg (179 lb 1.6 oz)    History of present illness:  Per Dr Konrad Dolores Tara [name] is a 55 y.o. female with medical history significant for asthma, anxiety and severe depression and history of prior episodic diverticulitis who presented to the [facility] ER with symptoms consistent with acute diverticulitis. Patient was initially started on Cipro and Flagyl by her PCP after presenting with symptoms consistent with her prior known history of diverticulitis. CT done at that time revealed stranding of the proximal mid sigmoid colon. Patient's symptoms had markedly improved to the point she was able to return to work for several days but beginning the evening prior to admission, she again started to have myalgias and progressive left  lower quadrant abdominal pain radiating to the flank. She also noticed that if she touched her abdomen on the right upper side that this would sit a contralateral pain response and left lower quadrant. She has had nausea and dry heaves as well as low-grade fevers 99. She's not had any diarrhea or blood in her stool.  Patient reported a history of several episodes of diverticulitis- typically she presents to the ER and gets IV antibiotics and oral medications to take with complete resolution of symptoms. She had a screening colonoscopy at age 48  Hospital Course:  #1 recurrent acute sigmoid diverticulitis Patient presented with an acute sigmoid diverticulitis after recent treatment for an acute diverticulitis CT abdomen and pelvis showed improvement with prior proximal mid sigmoid colonic diverticulitis however new fluid noted in the left pericolic gutter and wall thickening and stranding about the mid sigmoid colon where some for acute diverticulitis. No abscess noted. No perforation noted. Blood cultures were obtained with no growth to date. Patient was placed empirically on IV Unasyn. Patient with clinical improvement during the hospitalization after being started on clear liquid diet. Patient was subsequently advanced to full liquid diet which she tolerated. Due to patient's recurrent bouts of diverticulitis GI was consulted and followed the patient during the hospitalization. GI has been consulted and recommended MiraLAX to keep stools soft and that patient will benefit from elective colectomy. Patient will need to follow-up with GI in the outpatient setting once acute episode has resolved. Patient full liquid diet was advanced to a soft diet, however patient noted to be with significant abdominal pain or shows placed on a soft diet. Patient's diet was changed back to  clear liquids which she tolerated. Patient was subsequently advanced to a full liquid diet which she tolerated with no further abdominal  pain, nausea or vomiting. Patient be discharged home on a full liquid diet and to slowly advance her diet to a soft diet and avoid high residue diet until she is completely improved. Patient be discharged home on 10 days of oral Augmentin to complete a two-week course of antibiotic treatment. Patient will follow-up with Dr. Randa Evens of gastroenterology in one month.  #2 left upper quadrant/left chest pain Likely secondary to left pericolic gutter fluid noted. EKG with no ischemic changes. Cardiac enzymes negative 3. Improved clinically during the hospitalization. Patient was treated empirically that antibiotics, IV fluids and supportive care. By day of discharge patient denied any further pain.   #3 gastroesophageal reflux disease Patient was maintained on IV Pepcid.  #4 major depressive disorder/generalized anxiety disorder Continued on home regimen of Wellbutrin.  #5 asthma Stable. Continued on Dulera during the hospitalization.  #6 abnormal urinalysis Urine cultures negative.  Procedures:  CT abdomen and pelvis 01/24/2016  Consultations:  Gastroenterology: Dr. Randa Evens 01/25/2016  Discharge Exam: Filed Vitals:   01/27/16 2106 01/28/16 0504  BP: 131/57 109/53  Pulse: 55 51  Temp: 98.1 F (36.7 C) 98.5 F (36.9 C)  Resp: 18 18    General: NAD Cardiovascular: RRR Respiratory: CTAB GI: Abdomen is soft, nontender, nondistended, positive bowel sounds.  Discharge Instructions   Discharge Instructions    Diet - low sodium heart healthy    Complete by:  As directed   Full liquid diet and advance slowly to soft diet.     Discharge instructions    Complete by:  As directed   Follow up with Allean Found, MD in 2 weeks. Follow up with Dr Jalene Mullet in 1 month.     Increase activity slowly    Complete by:  As directed           Current Discharge Medication List    START taking these medications   Details  amoxicillin-clavulanate (AUGMENTIN) 875-125 MG tablet  Take 1 tablet by mouth 2 (two) times daily. Take for 10 days then stop. Qty: 20 tablet, Refills: 0    polyethylene glycol (MIRALAX) packet Take 17 g by mouth daily. Titrate for soft stools. Qty: 100 each, Refills: 0    prochlorperazine (COMPAZINE) 10 MG tablet Take 1 tablet (10 mg total) by mouth every 6 (six) hours as needed for nausea or vomiting. Qty: 20 tablet, Refills: 0    traMADol (ULTRAM) 50 MG tablet Take 1 tablet (50 mg total) by mouth every 6 (six) hours as needed. Qty: 20 tablet, Refills: 0      CONTINUE these medications which have NOT CHANGED   Details  buPROPion (WELLBUTRIN XL) 150 MG 24 hr tablet Take 150 mg by mouth every morning.     naproxen sodium (ANAPROX) 220 MG tablet Take 220 mg by mouth 2 (two) times daily as needed (FOR PAIN).      STOP taking these medications     ciprofloxacin (CIPRO) 500 MG tablet      metroNIDAZOLE (FLAGYL) 500 MG tablet      mometasone-formoterol (DULERA) 100-5 MCG/ACT AERO        Allergies  Allergen Reactions  . Codeine Itching    HYDROCODONE AND OXYCODONE ALSO   Follow-up Information    Follow up with Allean Found, MD. Schedule an appointment as soon as possible for a visit in 2 weeks.   Specialty:  Family  Medicine   Contact information:   [address] [place] [address] [phone]       Follow up with EDWARDS JR,JAMES L, MD. Schedule an appointment as soon as possible for a visit in 1 month.   Specialty:  Gastroenterology   Contact information:   [address] [place] [address] [phone]        The results of significant diagnostics from this hospitalization (including imaging, microbiology, ancillary and laboratory) are listed below for reference.    Significant Diagnostic Studies: Ct Abdomen Pelvis W Contrast  01/24/2016  CLINICAL DATA:  Continued left lower quadrant pain. Status post recent treatment for diverticulitis seen on CT scan 01/17/2016. EXAM: CT  ABDOMEN AND PELVIS WITH CONTRAST TECHNIQUE: Multidetector CT imaging of the abdomen and pelvis was performed using the standard protocol following bolus administration of intravenous contrast. CONTRAST:  100 ml ISOVUE-300 IOPAMIDOL (ISOVUE-300) INJECTION 61% COMPARISON:  CT abdomen and pelvis 01/17/2016 and 04/26/2014. FINDINGS: Mild dependent atelectasis is seen in the lung bases. No pleural or pericardial effusion. Very small hiatal hernia is noted. The gallbladder is somewhat distended, unchanged. No stones or inflammatory change identified. The liver, adrenal glands, spleen, kidneys and pancreas appear normal. Extensive diverticular disease of the colon is again seen. Since the prior examination, the patient has developed fluid in the left pericolic gutter. No focal fluid collection is identified. Inflammatory change about the proximal and mid sigmoid colon is improved. However, there is new wall thickening in the mid descending colon with stranding of pericolonic fat. Somewhat prominent stool burden in the ascending and transverse colon is noted. No pneumatosis, portal venous gas or free intraperitoneal air is identified. Uterus, adnexa and urinary bladder appear normal. No focal bony abnormality is identified. IMPRESSION: Extensive colonic diverticulosis. Stranding about the proximal mid sigmoid colon seen on the prior CT scan has resolved. However, there is new fluid in the left pericolic gutter and wall thickening and stranding about the mid sigmoid colon worrisome for acute diverticulitis. No focal fluid collection to suggest abscess is identified. No perforation. Electronically Signed   By: Drusilla Kanner M.D.   On: 01/24/2016 10:27   Ct Abdomen Pelvis W Contrast  01/17/2016  CLINICAL DATA:  Left lower quadrant pain for weeks with worsening today, nausea, history of diverticulitis EXAM: CT ABDOMEN AND PELVIS WITH CONTRAST TECHNIQUE: Multidetector CT imaging of the abdomen and pelvis was performed using  the standard protocol following bolus administration of intravenous contrast. CONTRAST:  ISOVUE-300 IOPAMIDOL (ISOVUE-300) INJECTION 61% COMPARISON:  04/26/2014 FINDINGS: Lower chest:  Lung bases are unremarkable. Hepatobiliary: No focal hepatic mass. There is a distended gallbladder without evidence of calcified gallstones. No intrahepatic biliary ductal dilatation. Pancreas: Enhanced pancreas is unremarkable. Spleen: No focal splenic mass. Adrenals/Urinary Tract: No adrenal gland mass. Enhanced kidneys are symmetrical in size. No hydronephrosis or hydroureter. Delayed renal images shows bilateral renal symmetrical excretion. Bilateral visualized proximal ureter is unremarkable. Stomach/Bowel: Small hiatal hernia. No small bowel obstruction. No pericecal inflammation. The patient is status post appendectomy. The terminal ileum is unremarkable. Scattered diverticula are noted descending colon. Multiple sigmoid colon diverticula. In axial image 65 there is focal thickening of colonic wall and diverticular wall in proximal sigmoid colon. There is stranding of pericolonic fat. Findings are consistent with acute diverticulitis or focal colitis. No evidence of diverticular abscess or perforation. No distal colonic obstruction. No extravasation of oral contrast material. Mild redundant sigmoid colon. Coronal image 33 there is a second  focus of mild thickening of colonic wall in sigmoid colon proximally with mild stranding of surrounding fat. Findings suspicious for a second focus of diverticulitis. Again no evidence of pericolonic abscess. Vascular/Lymphatic: No retroperitoneal or mesenteric adenopathy. No aortic aneurysm. Reproductive: The uterus and ovaries are unremarkable. Other: Moderate distended urinary bladder. No ascites or free abdominal air. Musculoskeletal: Sagittal images of the spine shows no destructive bony lesions. There is disc space flattening with vacuum disc phenomenon mild anterior and mild  posterior spurring at L3-L4 level. IMPRESSION: 1. In axial image 65 there is focal thickening of colonic wall and diverticular wall in proximal sigmoid colon. There is stranding of pericolonic fat. Findings are consistent with acute diverticulitis or focal colitis. No evidence of diverticular abscess or perforation. No distal colonic obstruction. No extravasation of oral contrast material. Mild redundant sigmoid colon. Coronal image 33 there is a second focus of mild thickening of colonic wall in sigmoid colon proximally with mild stranding of surrounding fat. Findings suspicious for a second focus of diverticulitis. 2. No hydronephrosis or hydroureter. 3. No pericecal inflammation.  Status post appendectomy. 4. A distended gallbladder is noted without evidence of calcified gallstones. 5. Moderate distended urinary bladder. 6. Degenerative changes lumbar spine at L3-L4 level. Electronically Signed   By: [name]  Pop M.D.   On: 01/17/2016 17:10   Mm Digital Diagnostic Unilat L  01/20/2016  CLINICAL DATA:  Screening recall for left breast calcifications EXAM: DIGITAL DIAGNOSTIC LEFT MAMMOGRAM COMPARISON:  Previous exam(s). ACR Breast Density Category b: There are scattered areas of fibroglandular density. FINDINGS: In the upper outer left breast, there is a small group of calcifications primarily punctate and round, covering 5 mm. There is no associated mass or distortion. There is no linearity or branching. IMPRESSION: Probably benign left breast calcifications. Short-term follow-up recommended. RECOMMENDATION: Diagnostic left breast mammography with magnification views in 6 months. I have discussed the findings and recommendations with the patient. Results were also provided in writing at the conclusion of the visit. If applicable, a reminder letter will be sent to the patient regarding the next appointment. BI-RADS CATEGORY  3: Probably benign. Electronically Signed   By: Amie Portlandavid  Ormond M.D.   On: 01/20/2016 15:11    Mm Digital Screening Bilateral  01/13/2016  CLINICAL DATA:  Screening. EXAM: DIGITAL SCREENING BILATERAL MAMMOGRAM WITH CAD COMPARISON:  Previous exam(s). ACR Breast Density Category b: There are scattered areas of fibroglandular density. FINDINGS: In the left breast, calcifications warrant further evaluation. In the right breast, no findings suspicious for malignancy. Images were processed with CAD. IMPRESSION: Further evaluation is suggested for calcifications in the left breast. RECOMMENDATION: Diagnostic mammogram of the left breast. (Code:[id number]-51M) The patient will be contacted regarding the findings, and additional imaging will be scheduled. BI-RADS CATEGORY  0: Incomplete. Need additional imaging evaluation and/or prior mammograms for comparison. Electronically Signed   By: Beckie SaltsSteven  Reid M.D.   On: 01/13/2016 07:59    Microbiology: Recent Results (from the past 240 hour(s))  Blood culture (routine x 2)     Status: None (Preliminary result)   Collection Time: 01/24/16  8:40 AM  Result Value Ref Range Status   Specimen Description BLOOD BLOOD LEFT FOREARM  Final   Special Requests BOTTLES DRAWN AEROBIC AND ANAEROBIC 5CC  Final   Culture   Final    NO GROWTH 3 DAYS Performed [facility]    Report Status PENDING  Incomplete  Urine culture     Status: None   Collection Time: 01/24/16  8:45 AM  Result Value Ref Range Status   Specimen Description URINE, CLEAN CATCH  Final   Special Requests NONE  Final   Culture NO GROWTH Performed at [facility]   Final   Report Status 01/25/2016 FINAL  Final  Blood culture (routine x 2)     Status: None (Preliminary result)   Collection Time: 01/24/16  8:50 AM  Result Value Ref Range Status   Specimen Description BLOOD RT HAND  Final   Special Requests BOTTLES DRAWN AEROBIC ONLY  5CC  Final   Culture   Final    NO GROWTH 3 DAYS Performed at [facility]    Report Status PENDING  Incomplete     Labs: Basic  Metabolic Panel:  Recent Labs Lab 01/24/16 0845 01/24/16 1838 01/25/16 0557 01/26/16 0312 01/27/16 0523 01/28/16 0625  NA 139  --  137 139 141 142  K 4.0  --  3.5 3.6 4.0 3.7  CL 105  --  108 111 113* 108  CO2 28  --  26 23 23 25   GLUCOSE 102*  --  101* 110* 91 89  BUN 14  --  6 <5* <5* <5*  CREATININE 1.00  --  0.89 0.76 0.86 0.80  CALCIUM 9.0  --  8.1* 8.2* 8.4* 8.7*  MG  --  2.1  --   --   --   --   PHOS  --  3.2  --   --   --   --    Liver Function Tests:  Recent Labs Lab 01/24/16 0845 01/25/16 0557 01/26/16 0312  AST 31 41 30  ALT 24 31 26   ALKPHOS 59 57 50  BILITOT 0.6 0.8 0.6  PROT 7.2 5.9* 5.7*  ALBUMIN 3.7 2.9* 2.7*    Recent Labs Lab 01/24/16 0845  LIPASE 16   No results for input(s): AMMONIA in the last 168 hours. CBC:  Recent Labs Lab 01/24/16 0845 01/25/16 0557 01/26/16 0312 01/27/16 0523  WBC 9.6 6.8 6.6 5.9  NEUTROABS 7.4  --   --   --   HGB 13.3 11.8* 11.4* 10.9*  HCT 40.7 37.7 35.4* 34.4*  MCV 92.1 93.5 91.9 92.2  PLT 321 296 255 263   Cardiac Enzymes:  Recent Labs Lab 01/25/16 1419 01/25/16 1903 01/26/16 0312  TROPONINI <0.03 <0.03 <0.03   BNP: BNP (last 3 results) No results for input(s): BNP in the last 8760 hours.  ProBNP (last 3 results) No results for input(s): PROBNP in the last 8760 hours.  CBG: No results for input(s): GLUCAP in the last 168 hours.     SignedRamiro Harvest MD.  [facility] 01/28/2016, 3:48 PM

## 2016-01-28 NOTE — ED Provider Notes (Signed)
CSN: [id number]     Arrival date & time 01/28/16  2054 History   First MD Initiated Contact with Patient 01/28/16 2206     Chief Complaint  Patient presents with  . Abdominal Pain     (Consider location/radiation/quality/duration/timing/severity/associated sxs/prior Treatment) HPI Comments: Patient with PMH of diverticulitis presents to the ED with a chief complaint of abdominal pain.  She was just discharged this morning.  She states that she felt great yesterday and this morning, but then she began running a fever again today (100.8) and then began having significantly worsening abdominal pain.  She states that she is now having worse pain than she has ever had.  She states that she called the on-call GI (Dr. Evette Cristal), and was told to come back to the ED for reassessment and possible repeat imaging.  The history is provided by the patient. No language interpreter was used.    Past Medical History  Diagnosis Date  . Depression   . Asthma   . Diverticulitis   . ADD (attention deficit disorder)   . Psoriasis    Past Surgical History  Procedure Laterality Date  . Knee surgery Right   . Appendectomy     Family History  Problem Relation Age of Onset  . COPD Mother   . Hypertension Mother   . Heart failure Father   . Hypertension Father   . Hypertension Sister   . Heart attack Brother   . Hypertension Brother   . Diabetes Brother   . Diabetes Maternal Grandmother   . Dementia Neg Hx    Social History  Substance Use Topics  . Smoking status: Never Smoker   . Smokeless tobacco: None  . Alcohol Use: 0.0 oz/week    0 Standard drinks or equivalent per week   OB History    No data available     Review of Systems  Gastrointestinal: Positive for abdominal pain.  All other systems reviewed and are negative.     Allergies  Codeine  Home Medications   Prior to Admission medications   Medication Sig Start Date End Date Taking? Authorizing Provider   amoxicillin-clavulanate (AUGMENTIN) 875-125 MG tablet Take 1 tablet by mouth 2 (two) times daily. Take for 10 days then stop. 01/28/16 02/07/16 Yes Rodolph Bong, MD  buPROPion (WELLBUTRIN XL) 150 MG 24 hr tablet Take 150 mg by mouth every morning.  12/28/15  Yes Historical Provider, MD  naproxen sodium (ANAPROX) 220 MG tablet Take 220 mg by mouth 2 (two) times daily as needed (FOR PAIN).   Yes Historical Provider, MD  traMADol (ULTRAM) 50 MG tablet Take 1 tablet (50 mg total) by mouth every 6 (six) hours as needed. 01/28/16  Yes Rodolph Bong, MD  polyethylene glycol [facility]) packet Take 17 g by mouth daily. Titrate for soft stools. 01/28/16   Rodolph Bong, MD  prochlorperazine (COMPAZINE) 10 MG tablet Take 1 tablet (10 mg total) by mouth every 6 (six) hours as needed for nausea or vomiting. 01/28/16   Rodolph Bong, MD   BP 148/65 mmHg  Pulse 91  Temp(Src) 99.7 F (37.6 C)  Resp 18  Ht  (1.702 m)  Wt 76.885 kg  BMI 26.54 kg/m2  SpO2 100%  LMP 08/30/2010 Physical Exam  Constitutional: She is oriented to person, place, and time. She appears well-developed and well-nourished.  HENT:  Head: Normocephalic and atraumatic.  Eyes: Conjunctivae and EOM are normal. Pupils are equal, round, and reactive to light.  Neck:  Normal range of motion. Neck supple.  Cardiovascular: Normal rate and regular rhythm.  Exam reveals no gallop and no friction rub.   No murmur heard. Pulmonary/Chest: Effort normal and breath sounds normal. No respiratory distress. She has no wheezes. She has no rales. She exhibits no tenderness.  Abdominal: Soft. Bowel sounds are normal. She exhibits no distension and no mass. There is tenderness. There is guarding. There is no rebound.  Abdomen is very tender to palpation diffusely with guarding   Musculoskeletal: Normal range of motion. She exhibits no edema or tenderness.  Neurological: She is alert and oriented to person, place, and time.  Skin: Skin is  warm and dry.  Psychiatric: She has a normal mood and affect. Her behavior is normal. Judgment and thought content normal.  Nursing note and vitals reviewed.   ED Course  Procedures (including critical care time) Results for orders placed or performed during the hospital encounter of 01/28/16  Lipase, blood  Result Value Ref Range   Lipase 19 11 - 51 U/L  Comprehensive metabolic panel  Result Value Ref Range   Sodium 141 135 - 145 mmol/L   Potassium 4.0 3.5 - 5.1 mmol/L   Chloride 109 101 - 111 mmol/L   CO2 25 22 - 32 mmol/L   Glucose, Bld 97 65 - 99 mg/dL   BUN <5 (L) 6 - 20 mg/dL   Creatinine, Ser 1.610.97 0.44 - 1.00 mg/dL   Calcium 9.5 8.9 - 09.610.3 mg/dL   Total Protein 7.2 6.5 - 8.1 g/dL   Albumin 3.2 (L) 3.5 - 5.0 g/dL   AST 24 15 - 41 U/L   ALT 25 14 - 54 U/L   Alkaline Phosphatase 63 38 - 126 U/L   Total Bilirubin 0.4 0.3 - 1.2 mg/dL   GFR calc non Af Amer >60 >60 mL/min   GFR calc Af Amer >60 >60 mL/min   Anion gap 7 5 - 15  CBC  Result Value Ref Range   WBC 9.6 4.0 - 10.5 K/uL   RBC 4.34 3.87 - 5.11 MIL/uL   Hemoglobin 12.7 12.0 - 15.0 g/dL   HCT 04.539.6 40.936.0 - 81.146.0 %   MCV 91.2 78.0 - 100.0 fL   MCH 29.3 26.0 - 34.0 pg   MCHC 32.1 30.0 - 36.0 g/dL   RDW 91.412.5 78.211.5 - 95.615.5 %   Platelets 371 150 - 400 K/uL  Urinalysis, Routine w reflex microscopic  Result Value Ref Range   Color, Urine YELLOW YELLOW   APPearance CLEAR CLEAR   Specific Gravity, Urine 1.013 1.005 - 1.030   pH 7.0 5.0 - 8.0   Glucose, UA NEGATIVE NEGATIVE mg/dL   Hgb urine dipstick NEGATIVE NEGATIVE   Bilirubin Urine NEGATIVE NEGATIVE   Ketones, ur NEGATIVE NEGATIVE mg/dL   Protein, ur NEGATIVE NEGATIVE mg/dL   Nitrite NEGATIVE NEGATIVE   Leukocytes, UA NEGATIVE NEGATIVE  I-Stat CG4 Lactic Acid, ED  Result Value Ref Range   Lactic Acid, Venous 0.73 0.5 - 1.9 mmol/L   Ct Abdomen Pelvis W Contrast  01/29/2016  CLINICAL DATA:  55 year old female with severe abdominal pain. EXAM: CT ABDOMEN AND  PELVIS WITH CONTRAST TECHNIQUE: Multidetector CT imaging of the abdomen and pelvis was performed using the standard protocol following bolus administration of intravenous contrast. CONTRAST:  100mL ISOVUE-300 IOPAMIDOL (ISOVUE-300) INJECTION 61% COMPARISON:  CT dated 01/24/2016 FINDINGS: Minimal bibasilar dependent atelectatic changes of the lungs. No intra-abdominal free air small free fluid within the pelvis and along the left  pericolic gutter. There is slight irregularity of the hepatic contour concerning for mild cirrhosis. Correlation with clinical exam and LFTs recommended. There is moderate distention of the gallbladder. No calcified stone identified. No pericholecystic fluid. There is a 6 mm lesion in the inferior aspect the body of the pancreas with fat attenuation most compatible with interspersed fat. This is similar to the prior study. The pancreas is otherwise unremarkable. The spleen, adrenal glands, kidneys, visualized ureters, and urinary bladder appear unremarkable. The uterus and ovaries are grossly unremarkable. There is extensive colonic diverticulosis primarily involving the descending and sigmoid colon. There is inflammatory changes and thickening of the distal descending and sigmoid colon with pericolonic stranding compatible with diverticulitis. There is progression of the diverticulitis compared to the prior CT. No drainable fluid collection/abscess or evidence of perforation. There is no bowel obstruction. Appendectomy. The abdominal aorta and IVC appear unremarkable. The origins of the celiac axis, SMA, IMA as well as the origins of the renal arteries are patent. The SMV, splenic vein, and main portal veins are patent. No portal venous gas identified. There is no adenopathy. The abdominal wall soft tissues appear unremarkable. There is degenerative changes of the spine. No acute fracture. IMPRESSION: Worsening of sigmoid diverticulitis. No diverticular abscess or evidence of perforation.  Electronically Signed   By: Elgie Collard M.D.   On: 01/29/2016 00:04   Ct Abdomen Pelvis W Contrast  01/24/2016  CLINICAL DATA:  Continued left lower quadrant pain. Status post recent treatment for diverticulitis seen on CT scan 01/17/2016. EXAM: CT ABDOMEN AND PELVIS WITH CONTRAST TECHNIQUE: Multidetector CT imaging of the abdomen and pelvis was performed using the standard protocol following bolus administration of intravenous contrast. CONTRAST:  100 ml ISOVUE-300 IOPAMIDOL (ISOVUE-300) INJECTION 61% COMPARISON:  CT abdomen and pelvis 01/17/2016 and 04/26/2014. FINDINGS: Mild dependent atelectasis is seen in the lung bases. No pleural or pericardial effusion. Very small hiatal hernia is noted. The gallbladder is somewhat distended, unchanged. No stones or inflammatory change identified. The liver, adrenal glands, spleen, kidneys and pancreas appear normal. Extensive diverticular disease of the colon is again seen. Since the prior examination, the patient has developed fluid in the left pericolic gutter. No focal fluid collection is identified. Inflammatory change about the proximal and mid sigmoid colon is improved. However, there is new wall thickening in the mid descending colon with stranding of pericolonic fat. Somewhat prominent stool burden in the ascending and transverse colon is noted. No pneumatosis, portal venous gas or free intraperitoneal air is identified. Uterus, adnexa and urinary bladder appear normal. No focal bony abnormality is identified. IMPRESSION: Extensive colonic diverticulosis. Stranding about the proximal mid sigmoid colon seen on the prior CT scan has resolved. However, there is new fluid in the left pericolic gutter and wall thickening and stranding about the mid sigmoid colon worrisome for acute diverticulitis. No focal fluid collection to suggest abscess is identified. No perforation. Electronically Signed   By: Drusilla Kanner M.D.   On: 01/24/2016 10:27   Ct Abdomen  Pelvis W Contrast  01/17/2016  CLINICAL DATA:  Left lower quadrant pain for weeks with worsening today, nausea, history of diverticulitis EXAM: CT ABDOMEN AND PELVIS WITH CONTRAST TECHNIQUE: Multidetector CT imaging of the abdomen and pelvis was performed using the standard protocol following bolus administration of intravenous contrast. CONTRAST:  ISOVUE-300 IOPAMIDOL (ISOVUE-300) INJECTION 61% COMPARISON:  04/26/2014 FINDINGS: Lower chest:  Lung bases are unremarkable. Hepatobiliary: No focal hepatic mass. There is a distended gallbladder without evidence of calcified  gallstones. No intrahepatic biliary ductal dilatation. Pancreas: Enhanced pancreas is unremarkable. Spleen: No focal splenic mass. Adrenals/Urinary Tract: No adrenal gland mass. Enhanced kidneys are symmetrical in size. No hydronephrosis or hydroureter. Delayed renal images shows bilateral renal symmetrical excretion. Bilateral visualized proximal ureter is unremarkable. Stomach/Bowel: Small hiatal hernia. No small bowel obstruction. No pericecal inflammation. The patient is status post appendectomy. The terminal ileum is unremarkable. Scattered diverticula are noted descending colon. Multiple sigmoid colon diverticula. In axial image 65 there is focal thickening of colonic wall and diverticular wall in proximal sigmoid colon. There is stranding of pericolonic fat. Findings are consistent with acute diverticulitis or focal colitis. No evidence of diverticular abscess or perforation. No distal colonic obstruction. No extravasation of oral contrast material. Mild redundant sigmoid colon. Coronal image 33 there is a second focus of mild thickening of colonic wall in sigmoid colon proximally with mild stranding of surrounding fat. Findings suspicious for a second focus of diverticulitis. Again no evidence of pericolonic abscess. Vascular/Lymphatic: No retroperitoneal or mesenteric adenopathy. No aortic aneurysm. Reproductive: The uterus and ovaries  are unremarkable. Other: Moderate distended urinary bladder. No ascites or free abdominal air. Musculoskeletal: Sagittal images of the spine shows no destructive bony lesions. There is disc space flattening with vacuum disc phenomenon mild anterior and mild posterior spurring at L3-L4 level. IMPRESSION: 1. In axial image 65 there is focal thickening of colonic wall and diverticular wall in proximal sigmoid colon. There is stranding of pericolonic fat. Findings are consistent with acute diverticulitis or focal colitis. No evidence of diverticular abscess or perforation. No distal colonic obstruction. No extravasation of oral contrast material. Mild redundant sigmoid colon. Coronal image 33 there is a second focus of mild thickening of colonic wall in sigmoid colon proximally with mild stranding of surrounding fat. Findings suspicious for a second focus of diverticulitis. 2. No hydronephrosis or hydroureter. 3. No pericecal inflammation.  Status post appendectomy. 4. A distended gallbladder is noted without evidence of calcified gallstones. 5. Moderate distended urinary bladder. 6. Degenerative changes lumbar spine at L3-L4 level. Electronically Signed   By: Natasha Mead M.D.   On: 01/17/2016 17:10   Mm Digital Diagnostic Unilat L  01/20/2016  CLINICAL DATA:  Screening recall for left breast calcifications EXAM: DIGITAL DIAGNOSTIC LEFT MAMMOGRAM COMPARISON:  Previous exam(s). ACR Breast Density Category b: There are scattered areas of fibroglandular density. FINDINGS: In the upper outer left breast, there is a small group of calcifications primarily punctate and round, covering 5 mm. There is no associated mass or distortion. There is no linearity or branching. IMPRESSION: Probably benign left breast calcifications. Short-term follow-up recommended. RECOMMENDATION: Diagnostic left breast mammography with magnification views in 6 months. I have discussed the findings and recommendations with the patient. Results were  also provided in writing at the conclusion of the visit. If applicable, a reminder letter will be sent to the patient regarding the next appointment. BI-RADS CATEGORY  3: Probably benign. Electronically Signed   By: Amie Portland M.D.   On: 01/20/2016 15:11   Mm Digital Screening Bilateral  01/13/2016  CLINICAL DATA:  Screening. EXAM: DIGITAL SCREENING BILATERAL MAMMOGRAM WITH CAD COMPARISON:  Previous exam(s). ACR Breast Density Category b: There are scattered areas of fibroglandular density. FINDINGS: In the left breast, calcifications warrant further evaluation. In the right breast, no findings suspicious for malignancy. Images were processed with CAD. IMPRESSION: Further evaluation is suggested for calcifications in the left breast. RECOMMENDATION: Diagnostic mammogram of the left breast. (Code:[id number]) The patient will be  contacted regarding the findings, and additional imaging will be scheduled. BI-RADS CATEGORY  0: Incomplete. Need additional imaging evaluation and/or prior mammograms for comparison. Electronically Signed   By: Beckie Salts M.D.   On: 01/13/2016 07:59    I have personally reviewed and evaluated these images and lab results as part of my medical decision-making.    MDM   Final diagnoses:  Diverticulitis of large intestine without perforation or abscess without bleeding   Patient with worsening abdominal pain.  Just discharged today from the hospital for diverticulitis.  Patient has severe abdominal tenderness on exam with guarding.  Sent back to ED by Dr. Evette Cristal of GI after patient called him from home.  Told that she may require a repeat scan.  I agree, given the immense amount of pain that she is in (worse than any previous episodes), will repeat CT.  Discussed with Dr. Estell Harpin, who agrees with the plan.  CT consistent with worsening diverticulitis.  Will restart IV abx.  Appreciate Dr. Clyde Lundborg for admitting the patient.   Roxy Horseman, PA-C 01/29/16 1610  Bethann Berkshire, MD 01/31/16 1318

## 2016-01-29 ENCOUNTER — Encounter (HOSPITAL_COMMUNITY): Payer: Self-pay | Admitting: *Deleted

## 2016-01-29 DIAGNOSIS — K219 Gastro-esophageal reflux disease without esophagitis: Secondary | ICD-10-CM

## 2016-01-29 DIAGNOSIS — F411 Generalized anxiety disorder: Secondary | ICD-10-CM | POA: Diagnosis present

## 2016-01-29 DIAGNOSIS — K5732 Diverticulitis of large intestine without perforation or abscess without bleeding: Secondary | ICD-10-CM | POA: Diagnosis present

## 2016-01-29 DIAGNOSIS — M7989 Other specified soft tissue disorders: Secondary | ICD-10-CM | POA: Diagnosis not present

## 2016-01-29 DIAGNOSIS — F339 Major depressive disorder, recurrent, unspecified: Secondary | ICD-10-CM | POA: Diagnosis present

## 2016-01-29 DIAGNOSIS — K5792 Diverticulitis of intestine, part unspecified, without perforation or abscess without bleeding: Secondary | ICD-10-CM | POA: Diagnosis present

## 2016-01-29 DIAGNOSIS — J4520 Mild intermittent asthma, uncomplicated: Secondary | ICD-10-CM | POA: Diagnosis not present

## 2016-01-29 DIAGNOSIS — Z79899 Other long term (current) drug therapy: Secondary | ICD-10-CM | POA: Diagnosis not present

## 2016-01-29 DIAGNOSIS — R109 Unspecified abdominal pain: Secondary | ICD-10-CM | POA: Diagnosis present

## 2016-01-29 DIAGNOSIS — J45909 Unspecified asthma, uncomplicated: Secondary | ICD-10-CM | POA: Diagnosis present

## 2016-01-29 DIAGNOSIS — R079 Chest pain, unspecified: Secondary | ICD-10-CM | POA: Diagnosis present

## 2016-01-29 DIAGNOSIS — F332 Major depressive disorder, recurrent severe without psychotic features: Secondary | ICD-10-CM

## 2016-01-29 DIAGNOSIS — Z885 Allergy status to narcotic agent status: Secondary | ICD-10-CM | POA: Diagnosis not present

## 2016-01-29 LAB — BASIC METABOLIC PANEL
Anion gap: 5 (ref 5–15)
CHLORIDE: 108 mmol/L (ref 101–111)
CO2: 23 mmol/L (ref 22–32)
CREATININE: 0.87 mg/dL (ref 0.44–1.00)
Calcium: 8 mg/dL — ABNORMAL LOW (ref 8.9–10.3)
GFR calc Af Amer: >60 mL/min (ref >60)
GFR calc non Af Amer: >60 mL/min (ref >60)
GLUCOSE: 117 mg/dL — AB (ref 65–99)
POTASSIUM: 3.8 mmol/L (ref 3.5–5.1)
Sodium: 136 mmol/L (ref 135–145)

## 2016-01-29 LAB — CBC
HCT: 36.4 % (ref 36.0–46.0)
Hemoglobin: 11.5 g/dL — ABNORMAL LOW (ref 12.0–15.0)
MCH: 28.9 pg (ref 26.0–34.0)
MCHC: 31.6 g/dL (ref 30.0–36.0)
MCV: 91.5 fL (ref 78.0–100.0)
Platelets: 299 10*3/uL (ref 150–400)
RBC: 3.98 MIL/uL (ref 3.87–5.11)
RDW: 12.6 % (ref 11.5–15.5)
WBC: 10.9 10*3/uL — ABNORMAL HIGH (ref 4.0–10.5)

## 2016-01-29 LAB — CULTURE, BLOOD (ROUTINE X 2)
CULTURE: NO GROWTH
Culture: NO GROWTH

## 2016-01-29 MED ORDER — ONDANSETRON HCL 4 MG/2ML IJ SOLN
4.0000 mg | Freq: Four times a day (QID) | INTRAMUSCULAR | Status: DC | PRN
  Administered 2016-01-29 – 2016-01-30 (×2): 4 mg via INTRAVENOUS
  Filled 2016-01-29 (×3): qty 2

## 2016-01-29 MED ORDER — LORAZEPAM 0.5 MG PO TABS: 0.5000 mg | ORAL_TABLET | Freq: Two times a day (BID) | ORAL | Status: DC | PRN

## 2016-01-29 MED ORDER — ENOXAPARIN SODIUM 40 MG/0.4ML ~~LOC~~ SOLN
40.0000 mg | SUBCUTANEOUS | Status: DC
  Administered 2016-01-29 – 2016-02-01 (×4): 40 mg via SUBCUTANEOUS
  Filled 2016-01-29 (×4): qty 0.4

## 2016-01-29 MED ORDER — HYDROMORPHONE HCL 1 MG/ML IJ SOLN
1.0000 mg | INTRAMUSCULAR | Status: DC | PRN
  Administered 2016-01-29 – 2016-01-30 (×2): 1 mg via INTRAVENOUS
  Filled 2016-01-29 (×2): qty 1

## 2016-01-29 MED ORDER — ACETAMINOPHEN 325 MG PO TABS
650.0000 mg | ORAL_TABLET | Freq: Four times a day (QID) | ORAL | Status: DC | PRN
  Administered 2016-01-29 – 2016-01-30 (×2): 650 mg via ORAL
  Filled 2016-01-29 (×2): qty 2

## 2016-01-29 MED ORDER — METRONIDAZOLE IN NACL 5-0.79 MG/ML-% IV SOLN
500.0000 mg | Freq: Three times a day (TID) | INTRAVENOUS | Status: DC
  Administered 2016-01-29 – 2016-02-01 (×10): 500 mg via INTRAVENOUS
  Filled 2016-01-29 (×10): qty 100

## 2016-01-29 MED ORDER — METRONIDAZOLE IN NACL 5-0.79 MG/ML-% IV SOLN
500.0000 mg | Freq: Once | INTRAVENOUS | Status: AC
Start: 2016-01-29 — End: 2016-01-29
  Administered 2016-01-29: 500 mg via INTRAVENOUS
  Filled 2016-01-29: qty 100

## 2016-01-29 MED ORDER — HYDROMORPHONE HCL 1 MG/ML IJ SOLN
1.0000 mg | Freq: Once | INTRAMUSCULAR | Status: AC
  Administered 2016-01-29: 1 mg via INTRAVENOUS
  Filled 2016-01-29: qty 1

## 2016-01-29 MED ORDER — CIPROFLOXACIN IN D5W 400 MG/200ML IV SOLN
400.0000 mg | Freq: Two times a day (BID) | INTRAVENOUS | Status: DC
  Administered 2016-01-29 – 2016-02-01 (×6): 400 mg via INTRAVENOUS
  Filled 2016-01-29 (×7): qty 200

## 2016-01-29 MED ORDER — PROCHLORPERAZINE MALEATE 10 MG PO TABS: 10.0000 mg | ORAL_TABLET | Freq: Four times a day (QID) | ORAL | Status: DC | PRN

## 2016-01-29 MED ORDER — MOMETASONE FURO-FORMOTEROL FUM 100-5 MCG/ACT IN AERO
2.0000 | INHALATION_SPRAY | Freq: Two times a day (BID) | RESPIRATORY_TRACT | Status: DC
  Filled 2016-01-29: qty 8.8

## 2016-01-29 MED ORDER — ALBUTEROL SULFATE (2.5 MG/3ML) 0.083% IN NEBU: 2.5000 mg | INHALATION_SOLUTION | Freq: Four times a day (QID) | RESPIRATORY_TRACT | Status: DC | PRN

## 2016-01-29 MED ORDER — TRAMADOL HCL 50 MG PO TABS: 50.0000 mg | ORAL_TABLET | Freq: Four times a day (QID) | ORAL | Status: DC | PRN

## 2016-01-29 MED ORDER — ACETAMINOPHEN 650 MG RE SUPP: 650.0000 mg | Freq: Four times a day (QID) | RECTAL | Status: DC | PRN

## 2016-01-29 MED ORDER — SODIUM CHLORIDE 0.9 % IV SOLN
INTRAVENOUS | Status: DC
  Administered 2016-01-29 – 2016-02-01 (×5): via INTRAVENOUS

## 2016-01-29 MED ORDER — DIPHENHYDRAMINE HCL 25 MG PO CAPS
25.0000 mg | ORAL_CAPSULE | Freq: Four times a day (QID) | ORAL | Status: DC | PRN
  Administered 2016-01-29 – 2016-02-01 (×5): 25 mg via ORAL
  Filled 2016-01-29 (×5): qty 1

## 2016-01-29 MED ORDER — ONDANSETRON HCL 4 MG PO TABS: 4.0000 mg | ORAL_TABLET | Freq: Four times a day (QID) | ORAL | Status: DC | PRN

## 2016-01-29 MED ORDER — PROCHLORPERAZINE EDISYLATE 5 MG/ML IJ SOLN
10.0000 mg | Freq: Four times a day (QID) | INTRAMUSCULAR | Status: DC | PRN
Start: 2016-01-29 — End: 2016-02-01
  Administered 2016-01-30: 10 mg via INTRAVENOUS
  Filled 2016-01-29: qty 2

## 2016-01-29 MED ORDER — TRAMADOL HCL 50 MG PO TABS
50.0000 mg | ORAL_TABLET | Freq: Four times a day (QID) | ORAL | Status: DC | PRN
  Administered 2016-01-29: 50 mg via ORAL
  Filled 2016-01-29: qty 1

## 2016-01-29 MED ORDER — SODIUM CHLORIDE 0.9 % IV BOLUS (SEPSIS)
2000.0000 mL | Freq: Once | INTRAVENOUS | Status: AC
  Administered 2016-01-29: 2000 mL via INTRAVENOUS

## 2016-01-29 MED ORDER — BUPROPION HCL ER (XL) 150 MG PO TB24
150.0000 mg | ORAL_TABLET | Freq: Every morning | ORAL | Status: DC
  Administered 2016-01-29 – 2016-02-01 (×4): 150 mg via ORAL
  Filled 2016-01-29 (×4): qty 1

## 2016-01-29 MED ORDER — POLYETHYLENE GLYCOL 3350 17 G PO PACK: 17.0000 g | PACK | Freq: Every day | ORAL | Status: DC | PRN

## 2016-01-29 MED ORDER — CIPROFLOXACIN IN D5W 400 MG/200ML IV SOLN
400.0000 mg | Freq: Once | INTRAVENOUS | Status: AC
Start: 2016-01-29 — End: 2016-01-29
  Administered 2016-01-29: 400 mg via INTRAVENOUS
  Filled 2016-01-29: qty 200

## 2016-01-29 MED ORDER — FAMOTIDINE IN NACL 20-0.9 MG/50ML-% IV SOLN
20.0000 mg | Freq: Two times a day (BID) | INTRAVENOUS | Status: DC
  Administered 2016-01-29 – 2016-02-01 (×7): 20 mg via INTRAVENOUS
  Filled 2016-01-29 (×8): qty 50

## 2016-01-29 MED ORDER — NAPROXEN 250 MG PO TABS: 250.0000 mg | ORAL_TABLET | Freq: Two times a day (BID) | ORAL | Status: DC | PRN

## 2016-01-29 NOTE — Progress Notes (Signed)
[facility] Progress Note  Subjective: The patient was discharged from the hospital yesterday after being treated for diverticulitis. She was feeling well and had no abdominal pain. Last evening she developed a fever and recurring left lower quadrant abdominal pain. She came back to the ER where a CT scan was done showing worsening diverticulitis. She was readmitted. CT scan it not reveal perforation or abscess.  Objective: Vital signs in last 24 hours: Temp:  [98.7 F (37.1 C)-99.7 F (37.6 C)] 98.7 F (37.1 C) (07/16 1319) Pulse Rate:  [66-93] 66 (07/16 1319) Resp:  [18-20] 18 (07/16 1319) BP: (106-148)/(45-67) 114/45 mmHg (07/16 1319) SpO2:  [93 %-100 %] 99 % (07/16 1319) Weight:  [76.885 kg (169 lb 8 oz)-77.202 kg (170 lb 3.2 oz)] 77.202 kg (170 lb 3.2 oz) (07/16 0457) Weight change:    PE:  No acute distress  Heart regular rhythm  Lungs clear  Abdomen: Bowel sounds present, soft, tender in left lower quadrant    Lab Results: Results for orders placed or performed during the hospital encounter of 01/28/16 (from the past 24 hour(s))  Urinalysis, Routine w reflex microscopic     Status: None   Collection Time: 01/28/16  9:09 PM  Result Value Ref Range   Color, Urine YELLOW YELLOW   APPearance CLEAR CLEAR   Specific Gravity, Urine 1.013 1.005 - 1.030   pH 7.0 5.0 - 8.0   Glucose, UA NEGATIVE NEGATIVE mg/dL   Hgb urine dipstick NEGATIVE NEGATIVE   Bilirubin Urine NEGATIVE NEGATIVE   Ketones, ur NEGATIVE NEGATIVE mg/dL   Protein, ur NEGATIVE NEGATIVE mg/dL   Nitrite NEGATIVE NEGATIVE   Leukocytes, UA NEGATIVE NEGATIVE  Lipase, blood     Status: None   Collection Time: 01/28/16  9:12 PM  Result Value Ref Range   Lipase 19 11 - 51 U/L  Comprehensive metabolic panel     Status: Abnormal   Collection Time: 01/28/16  9:12 PM  Result Value Ref Range   Sodium 141 135 - 145 mmol/L   Potassium 4.0 3.5 - 5.1 mmol/L   Chloride 109 101 - 111 mmol/L   CO2 25 22 -  32 mmol/L   Glucose, Bld 97 65 - 99 mg/dL   BUN <5 (L) 6 - 20 mg/dL   Creatinine, Ser 1.32 0.44 - 1.00 mg/dL   Calcium 9.5 8.9 - 44.0 mg/dL   Total Protein 7.2 6.5 - 8.1 g/dL   Albumin 3.2 (L) 3.5 - 5.0 g/dL   AST 24 15 - 41 U/L   ALT 25 14 - 54 U/L   Alkaline Phosphatase 63 38 - 126 U/L   Total Bilirubin 0.4 0.3 - 1.2 mg/dL   GFR calc non Af Amer >60 >60 mL/min   GFR calc Af Amer >60 >60 mL/min   Anion gap 7 5 - 15  CBC     Status: None   Collection Time: 01/28/16  9:12 PM  Result Value Ref Range   WBC 9.6 4.0 - 10.5 K/uL   RBC 4.34 3.87 - 5.11 MIL/uL   Hemoglobin 12.7 12.0 - 15.0 g/dL   HCT 10.2 72.5 - 36.6 %   MCV 91.2 78.0 - 100.0 fL   MCH 29.3 26.0 - 34.0 pg   MCHC 32.1 30.0 - 36.0 g/dL   RDW 44.0 34.7 - 42.5 %   Platelets 371 150 - 400 K/uL  I-Stat CG4 Lactic Acid, ED     Status: None   Collection Time: 01/28/16 10:49 PM  Result  Value Ref Range   Lactic Acid, Venous 0.73 0.5 - 1.9 mmol/L  Basic metabolic panel     Status: Abnormal   Collection Time: 01/29/16  5:08 AM  Result Value Ref Range   Sodium 136 135 - 145 mmol/L   Potassium 3.8 3.5 - 5.1 mmol/L   Chloride 108 101 - 111 mmol/L   CO2 23 22 - 32 mmol/L   Glucose, Bld 117 (H) 65 - 99 mg/dL   BUN <5 (L) 6 - 20 mg/dL   Creatinine, Ser 1.610.87 0.44 - 1.00 mg/dL   Calcium 8.0 (L) 8.9 - 10.3 mg/dL   GFR calc non Af Amer >60 >60 mL/min   GFR calc Af Amer >60 >60 mL/min   Anion gap 5 5 - 15  CBC     Status: Abnormal   Collection Time: 01/29/16  5:08 AM  Result Value Ref Range   WBC 10.9 (H) 4.0 - 10.5 K/uL   RBC 3.98 3.87 - 5.11 MIL/uL   Hemoglobin 11.5 (L) 12.0 - 15.0 g/dL   HCT 09.636.4 04.536.0 - 40.946.0 %   MCV 91.5 78.0 - 100.0 fL   MCH 28.9 26.0 - 34.0 pg   MCHC 31.6 30.0 - 36.0 g/dL   RDW 81.112.6 91.411.5 - 78.215.5 %   Platelets 299 150 - 400 K/uL    Studies/Results: Ct Abdomen Pelvis W Contrast  01/29/2016  CLINICAL DATA:  55 year old female with severe abdominal pain. EXAM: CT ABDOMEN AND PELVIS WITH CONTRAST  TECHNIQUE: Multidetector CT imaging of the abdomen and pelvis was performed using the standard protocol following bolus administration of intravenous contrast. CONTRAST:  100mL ISOVUE-300 IOPAMIDOL (ISOVUE-300) INJECTION 61% COMPARISON:  CT dated 01/24/2016 FINDINGS: Minimal bibasilar dependent atelectatic changes of the lungs. No intra-abdominal free air small free fluid within the pelvis and along the left pericolic gutter. There is slight irregularity of the hepatic contour concerning for mild cirrhosis. Correlation with clinical exam and LFTs recommended. There is moderate distention of the gallbladder. No calcified stone identified. No pericholecystic fluid. There is a 6 mm lesion in the inferior aspect the body of the pancreas with fat attenuation most compatible with interspersed fat. This is similar to the prior study. The pancreas is otherwise unremarkable. The spleen, adrenal glands, kidneys, visualized ureters, and urinary bladder appear unremarkable. The uterus and ovaries are grossly unremarkable. There is extensive colonic diverticulosis primarily involving the descending and sigmoid colon. There is inflammatory changes and thickening of the distal descending and sigmoid colon with pericolonic stranding compatible with diverticulitis. There is progression of the diverticulitis compared to the prior CT. No drainable fluid collection/abscess or evidence of perforation. There is no bowel obstruction. Appendectomy. The abdominal aorta and IVC appear unremarkable. The origins of the celiac axis, SMA, IMA as well as the origins of the renal arteries are patent. The SMV, splenic vein, and main portal veins are patent. No portal venous gas identified. There is no adenopathy. The abdominal wall soft tissues appear unremarkable. There is degenerative changes of the spine. No acute fracture. IMPRESSION: Worsening of sigmoid diverticulitis. No diverticular abscess or evidence of perforation. Electronically Signed    By: Elgie CollardArash  Radparvar M.D.   On: 01/29/2016 00:04      Assessment: Diverticulitis  Plan:   Patient has been readmitted to the hospital. Cipro and Flagyl for antibiotics. Follow clinically.    Gwenevere AbbotSAM F GANEM 01/29/2016, 4:48 PM  Pager: [phone] If no answer or after 5 PM call [phone]

## 2016-01-29 NOTE — Progress Notes (Signed)
PROGRESS NOTE    Tara [name]  ZOX:[id number] DOB: 01-25-1961 DOA: 01/28/2016 PCP: Allean Found, MD    Brief Narrative:  Patient is a pleasant 55 year old female history of recurrent diverticulitis, generalized anxiety disorder, major depressive disorder, asthma who was just discharged on 01/28/2016 after recurrent bout of diverticulitis presenting back with worsening abdominal pain and noted on repeat CT scan to have a worsening sigmoid diverticulitis.   Assessment & Plan:   Principal Problem:   Diverticulitis of large intestine without perforation or abscess without bleeding Active Problems:   MDD (major depressive disorder), recurrent severe, without psychosis (HCC)   Generalized anxiety disorder   Asthma in adult   Gastroesophageal reflux disease without esophagitis   Diverticulitis   Diverticulitis large intestine  #1 recurrent worsening sigmoid diverticulitis Patient was just discharged 01/28/2016 after being treated for recurrent acute sigmoid diverticulitis in stable and improved condition on a full liquid diet and on oral Augmentin to complete a course of antibiotic treatment. Patient returned several hours after discharge with worsening abdominal pain after drinking a milkshake. Repeat CT scan done on admission showed worsening sigmoid diverticulitis. Patient was subsequently admitted into the hospital and placed on a clear liquid diet, pain management, antiemetics, supportive care. Patient also started empirically on IV ciprofloxacin and Flagyl. Will consult with GI as patient was being followed by GI during last hospitalization. May need to consult with general surgery due to patient's worsening sigmoid diverticulitis and recurrent never to colitis.  #2 major depressive disorder /generalized anxiety disorder Stable. No suicidal or homicidal ideations. Continue home regimen of Wellbutrin.  #3 gastroesophageal reflux disease IV Pepcid.  #4 asthma Stable.  Dulera   DVT prophylaxis: Lovenox Code Status: Full Family Communication: Updated patient. No family at bedside. Disposition Plan: Home when medically stable and tolerating oral intake with improvement with abdominal symptoms.   Consultants:   Gastroenterology pending  Procedures:   CT abdomen and pelvis 01/28/2016  Antimicrobials:   IV ciprofloxacin 01/28/2016  IV Flagyl 01/28/2016   Subjective: Patient complaining of significant abdominal pain more in the left lower quadrant which has improved some since admission. Patient stated that after admission yesterday went home recommended shake and a few hours later had severe significant abdominal pain which prompted her to call Dr. Evette Cristal, gastroenterology who advised her to present back to the ED. Patient denies any shortness of breath. No chest pain. No emesis.  Objective: Filed Vitals:   01/29/16 0115 01/29/16 0200 01/29/16 0457 01/29/16 1319  BP: 116/58 118/61 106/45 114/45  Pulse: 87 79 76 66  Temp:  99.2 F (37.3 C) 99.1 F (37.3 C) 98.7 F (37.1 C)  TempSrc:   Oral Oral  Resp:  Height:    (1.702 m)   Weight:   77.202 kg (170 lb 3.2 oz)   SpO2: 96% 97% 98% 99%   No intake or output data in the 24 hours ending 01/29/16 1550 Filed Weights   01/28/16 2101 01/29/16 0457  Weight: 76.885 kg (169 lb 8 oz) 77.202 kg (170 lb 3.2 oz)    Examination:  General exam: Appears calm and comfortable  Respiratory system: Clear to auscultation. Respiratory effort normal. Cardiovascular system: S1 & S2 heard, RRR. No JVD, murmurs, rubs, gallops or clicks. No pedal edema. Gastrointestinal system: Abdomen is nondistended, soft and diffusely tender to palpation greater in the left lower quadrant and also the left upper quadrant. No organomegaly or masses felt. Normal bowel sounds heard. Central nervous system: Alert and  oriented. No focal neurological deficits. Extremities: Symmetric 5 x 5 power. Skin: No rashes,  lesions or ulcers Psychiatry: Judgement and insight appear normal. Mood & affect appropriate.     Data Reviewed: I have personally reviewed following labs and imaging studies  CBC:  Recent Labs Lab 01/24/16 0845 01/25/16 0557 01/26/16 0312 01/27/16 0523 01/28/16 2112 01/29/16 0508  WBC 9.6 6.8 6.6 5.9 9.6 10.9*  NEUTROABS 7.4  --   --   --   --   --   HGB 13.3 11.8* 11.4* 10.9* 12.7 11.5*  HCT 40.7 37.7 35.4* 34.4* 39.6 36.4  MCV 92.1 93.5 91.9 92.2 91.2 91.5  PLT 321 296 255 263 371 299   Basic Metabolic Panel:  Recent Labs Lab 01/24/16 1838  01/26/16 0312 01/27/16 0523 01/28/16 0625 01/28/16 2112 01/29/16 0508  NA  --   < > 139 141 142 141 136  K  --   < > 3.6 4.0 3.7 4.0 3.8  CL  --   < > 111 113* 108 109 108  CO2  --   < > 23 23 25 25 23   GLUCOSE  --   < > 110* 91 89 97 117*  BUN  --   < > <5* <5* <5* <5* <5*  CREATININE  --   < > 0.76 0.86 0.80 0.97 0.87  CALCIUM  --   < > 8.2* 8.4* 8.7* 9.5 8.0*  MG 2.1  --   --   --   --   --   --   PHOS 3.2  --   --   --   --   --   --   < > = values in this interval not displayed. GFR: Estimated Creatinine Clearance: 77.3 mL/min (by C-G formula based on Cr of 0.87). Liver Function Tests:  Recent Labs Lab 01/24/16 0845 01/25/16 0557 01/26/16 0312 01/28/16 2112  AST 31 41 30 24  ALT 24 31 26 25   ALKPHOS 59 57 50 63  BILITOT 0.6 0.8 0.6 0.4  PROT 7.2 5.9* 5.7* 7.2  ALBUMIN 3.7 2.9* 2.7* 3.2*    Recent Labs Lab 01/24/16 0845 01/28/16 2112  LIPASE 16 19   No results for input(s): AMMONIA in the last 168 hours. Coagulation Profile: No results for input(s): INR, PROTIME in the last 168 hours. Cardiac Enzymes:  Recent Labs Lab 01/25/16 1419 01/25/16 1903 01/26/16 0312  TROPONINI <0.03 <0.03 <0.03   BNP (last 3 results) No results for input(s): PROBNP in the last 8760 hours. HbA1C: No results for input(s): HGBA1C in the last 72 hours. CBG: No results for input(s): GLUCAP in the last 168  hours. Lipid Profile: No results for input(s): CHOL, HDL, LDLCALC, TRIG, CHOLHDL, LDLDIRECT in the last 72 hours. Thyroid Function Tests: No results for input(s): TSH, T4TOTAL, FREET4, T3FREE, THYROIDAB in the last 72 hours. Anemia Panel: No results for input(s): VITAMINB12, FOLATE, FERRITIN, TIBC, IRON, RETICCTPCT in the last 72 hours. Sepsis Labs:  Recent Labs Lab 01/28/16 2249  LATICACIDVEN 0.73    Recent Results (from the past 240 hour(s))  Blood culture (routine x 2)     Status: None (Preliminary result)   Collection Time: 01/24/16  8:40 AM  Result Value Ref Range Status   Specimen Description BLOOD BLOOD LEFT FOREARM  Final   Special Requests BOTTLES DRAWN AEROBIC AND ANAEROBIC 5CC  Final   Culture   Final    NO GROWTH 4 DAYS Performed at [facility]   Report Status PENDING  Incomplete  Urine culture     Status: None   Collection Time: 01/24/16  8:45 AM  Result Value Ref Range Status   Specimen Description URINE, CLEAN CATCH  Final   Special Requests NONE  Final   Culture NO GROWTH Performed at [facility]   Final   Report Status 01/25/2016 FINAL  Final  Blood culture (routine x 2)     Status: None (Preliminary result)   Collection Time: 01/24/16  8:50 AM  Result Value Ref Range Status   Specimen Description BLOOD RT HAND  Final   Special Requests BOTTLES DRAWN AEROBIC ONLY  5CC  Final   Culture   Final    NO GROWTH 4 DAYS Performed at [facility]    Report Status PENDING  Incomplete         Radiology Studies: Ct Abdomen Pelvis W Contrast  01/29/2016  CLINICAL DATA:  55 year old female with severe abdominal pain. EXAM: CT ABDOMEN AND PELVIS WITH CONTRAST TECHNIQUE: Multidetector CT imaging of the abdomen and pelvis was performed using the standard protocol following bolus administration of intravenous contrast. CONTRAST:  ISOVUE-300 IOPAMIDOL (ISOVUE-300) INJECTION 61% COMPARISON:  CT dated 01/24/2016 FINDINGS: Minimal  bibasilar dependent atelectatic changes of the lungs. No intra-abdominal free air small free fluid within the pelvis and along the left pericolic gutter. There is slight irregularity of the hepatic contour concerning for mild cirrhosis. Correlation with clinical exam and LFTs recommended. There is moderate distention of the gallbladder. No calcified stone identified. No pericholecystic fluid. There is a 6 mm lesion in the inferior aspect the body of the pancreas with fat attenuation most compatible with interspersed fat. This is similar to the prior study. The pancreas is otherwise unremarkable. The spleen, adrenal glands, kidneys, visualized ureters, and urinary bladder appear unremarkable. The uterus and ovaries are grossly unremarkable. There is extensive colonic diverticulosis primarily involving the descending and sigmoid colon. There is inflammatory changes and thickening of the distal descending and sigmoid colon with pericolonic stranding compatible with diverticulitis. There is progression of the diverticulitis compared to the prior CT. No drainable fluid collection/abscess or evidence of perforation. There is no bowel obstruction. Appendectomy. The abdominal aorta and IVC appear unremarkable. The origins of the celiac axis, SMA, IMA as well as the origins of the renal arteries are patent. The SMV, splenic vein, and main portal veins are patent. No portal venous gas identified. There is no adenopathy. The abdominal wall soft tissues appear unremarkable. There is degenerative changes of the spine. No acute fracture. IMPRESSION: Worsening of sigmoid diverticulitis. No diverticular abscess or evidence of perforation. Electronically Signed   By: Elgie Collard M.D.   On: 01/29/2016 00:04        Scheduled Meds: . buPROPion  150 mg Oral q morning - 10a  . ciprofloxacin  400 mg Intravenous Q12H  . enoxaparin (LOVENOX) injection  40 mg Subcutaneous Q24H  . famotidine (PEPCID) IV  20 mg Intravenous Q12H   . metronidazole  500 mg Intravenous Q8H   Continuous Infusions: . sodium chloride 100 mL/hr at 01/29/16 1539        Time spent: 40 MINS    THOMPSON,DANIEL, MD [facility] Pager [phone]  If 7PM-7AM, please contact night-coverage [email or web address] Password [email or web address] 01/29/2016, 3:50 PM

## 2016-01-29 NOTE — Progress Notes (Signed)
Pharmacy Antibiotic Note  Tara PointerJill [name] is a 55 y.o. female admitted on 01/28/2016 with diverticulitis.  Pharmacy has been consulted for Ciprofloxacin dosing. Pt also on Flagyl.  Plan: Ciprofloxacin 400mg  IV q12h Will f/u renal function, micro data, and pt's clinical condition  Height: 5\' 7"  (170.2 cm) Weight: 169 lb 8 oz (76.885 kg) IBW/kg (Calculated) : 61.6  Temp (24hrs), Avg:99.1 F (37.3 C), Min:98.5 F (36.9 C), Max:99.7 F (37.6 C)   Recent Labs Lab 01/24/16 0845 01/25/16 0557 01/26/16 0312 01/27/16 0523 01/28/16 0625 01/28/16 2112 01/28/16 2249  WBC 9.6 6.8 6.6 5.9  --  9.6  --   CREATININE 1.00 0.89 0.76 0.86 0.80 0.97  --   LATICACIDVEN  --   --   --   --   --   --  0.73    Estimated Creatinine Clearance: 69.2 mL/min (by C-G formula based on Cr of 0.97).    Allergies  Allergen Reactions  . Codeine Itching    HYDROCODONE AND OXYCODONE ALSO    Antimicrobials this admission: 7/16 Cipro >>  7/16 Flagyl >>   Thank you for allowing pharmacy to be a part of this patient's care.  Tara [name], Tara [name] 01/29/2016 1:01 AM

## 2016-01-29 NOTE — H&P (Signed)
History and Physical    [name] WUJ:[id number] DOB: 1961/04/03 DOA: 01/28/2016  Referring MD/NP/PA:   PCP: Allean Found, MD   Patient coming from:  The patient is coming from home.  At baseline, pt is independent for most of ADL.        Chief Complaint: Abdominal pain  HPI: Porfiria Tara [name] is a 55 y.o. female with medical history significant of diverticulitis, psoriasis, depression, ADD, who presents with abdominal pain.  Patient was recently hospitalized from 7/11-7/15 due to diverticulitis. She was treated with IV Unasyn with improvement, and discharged to home on Augmentin. During admission, GI was consulted, recommended MiraLAX to keep stools soft and that patient may benefit from elective colectomy. Pt states that her stool has been soft, therefore she did not take Miralax. She states that her abdominal pain has been worsening after she went home. It is located mainly in the left lower quadrant, also in right lower quadrant. It is constant, 10 out of 10 in severity, radiating to the back. It is not aggravated or alleviated by any known factors. Patient does not have nausea or vomiting. No fever or chills. She denies cough, shortness of breath, chest pain, symptoms of UTI or unilateral weakness.  ED Course: pt was found to have WBC 9.6, negative urinalysis, lipase 19, ligated with 0.73, temperature 99.7, no tachycardia, no tachypnea, electrolytes and renal function okay. CT abdomen/pelvis showed worsening diverticulitis, but no abscess or perforation.  Review of Systems:   General: no fevers, chills, no changes in body weight, has poor appetite, has fatigue HEENT: no blurry vision, hearing changes or sore throat Pulm: no dyspnea, coughing, wheezing CV: no chest pain, no palpitations Abd: no nausea, vomiting, has abdominal pain, no diarrhea, constipation GU: no dysuria, burning on urination, increased urinary frequency, hematuria  Ext: no leg edema Neuro: no unilateral  weakness, numbness, or tingling, no vision change or hearing loss Skin: no rash MSK: No muscle spasm, no deformity, no limitation of range of movement in spin Heme: No easy bruising.  Travel history: No recent long distant travel.  Allergy:  Allergies  Allergen Reactions  . Codeine Itching    HYDROCODONE AND OXYCODONE ALSO    Past Medical History  Diagnosis Date  . Depression   . Asthma   . Diverticulitis   . ADD (attention deficit disorder)   . Psoriasis     Past Surgical History  Procedure Laterality Date  . Knee surgery Right   . Appendectomy      Social History:  reports that she has never smoked. She does not have any smokeless tobacco history on file. She reports that she drinks alcohol. She reports that she does not use illicit drugs.  Family History:  Family History  Problem Relation Age of Onset  . COPD Mother   . Hypertension Mother   . Heart failure Father   . Hypertension Father   . Hypertension Sister   . Heart attack Brother   . Hypertension Brother   . Diabetes Brother   . Diabetes Maternal Grandmother   . Dementia Neg Hx      Prior to Admission medications   Medication Sig Start Date End Date Taking? Authorizing Provider  amoxicillin-clavulanate (AUGMENTIN) 875-125 MG tablet Take 1 tablet by mouth 2 (two) times daily. Take for 10 days then stop. 01/28/16 02/07/16 Yes Rodolph Bong, MD  buPROPion (WELLBUTRIN XL) 150 MG 24 hr tablet Take 150 mg by mouth every morning.  12/28/15  Yes Historical Provider, MD  naproxen sodium (ANAPROX) 220 MG tablet Take 220 mg by mouth 2 (two) times daily as needed (FOR PAIN).   Yes Historical Provider, MD  traMADol (ULTRAM) 50 MG tablet Take 1 tablet (50 mg total) by mouth every 6 (six) hours as needed. 01/28/16  Yes Rodolph Bong, MD  polyethylene glycol [place]) packet Take 17 g by mouth daily. Titrate for soft stools. 01/28/16   Rodolph Bong, MD  prochlorperazine (COMPAZINE) 10 MG tablet Take 1 tablet (10  mg total) by mouth every 6 (six) hours as needed for nausea or vomiting. 01/28/16   Rodolph Bong, MD    Physical Exam: Filed Vitals:   01/28/16 2345 01/29/16 0000 01/29/16 0015 01/29/16 0030  BP: 122/67 129/58 123/65 129/60  Pulse: 93 81 91 74  Temp:      Resp:      Height:      Weight:      SpO2: 98% 96% 97% 98%   General: Not in acute distress HEENT:       Eyes: PERRL, EOMI, no scleral icterus.       ENT: No discharge from the ears and nose, no pharynx injection, no tonsillar enlargement.        Neck: No JVD, no bruit, no mass felt. Heme: No neck lymph node enlargement. Cardiac: S1/S2, RRR, No murmurs, No gallops or rubs. Pulm: No rales, wheezing, rhonchi or rubs. Abd: Soft, nondistended, tenderness over LLQ and RLQ, no rebound pain, no organomegaly, BS present. GU: No hematuria Ext: No pitting leg edema bilaterally. 2+DP/PT pulse bilaterally. Musculoskeletal: No joint deformities, No joint redness or warmth, no limitation of ROM in spin. Skin: No rashes.  Neuro: Alert, oriented X3, cranial nerves II-XII grossly intact, moves all extremities normally. Psych: Patient is not psychotic, no suicidal or hemocidal ideation.  Labs on Admission: I have personally reviewed following labs and imaging studies  CBC:  Recent Labs Lab 01/24/16 0845 01/25/16 0557 01/26/16 0312 01/27/16 0523 01/28/16 2112  WBC 9.6 6.8 6.6 5.9 9.6  NEUTROABS 7.4  --   --   --   --   HGB 13.3 11.8* 11.4* 10.9* 12.7  HCT 40.7 37.7 35.4* 34.4* 39.6  MCV 92.1 93.5 91.9 92.2 91.2  PLT 321 296 255 263 371   Basic Metabolic Panel:  Recent Labs Lab 01/24/16 1838 01/25/16 0557 01/26/16 0312 01/27/16 0523 01/28/16 0625 01/28/16 2112  NA  --  137 139 141 142 141  K  --  3.5 3.6 4.0 3.7 4.0  CL  --  108 111 113* 108 109  CO2  --  26 23 23 25 25   GLUCOSE  --  101* 110* 91 89 97  BUN  --  6 <5* <5* <5* <5*  CREATININE  --  0.89 0.76 0.86 0.80 0.97  CALCIUM  --  8.1* 8.2* 8.4* 8.7* 9.5  MG 2.1   --   --   --   --   --   PHOS 3.2  --   --   --   --   --    GFR: Estimated Creatinine Clearance: 69.2 mL/min (by C-G formula based on Cr of 0.97). Liver Function Tests:  Recent Labs Lab 01/24/16 0845 01/25/16 0557 01/26/16 0312 01/28/16 2112  AST 31 41 30 24  ALT 24 31 26 25   ALKPHOS 59 57 50 63  BILITOT 0.6 0.8 0.6 0.4  PROT 7.2 5.9* 5.7* 7.2  ALBUMIN 3.7 2.9* 2.7* 3.2*    Recent Labs  Lab 01/24/16 0845 01/28/16 2112  LIPASE 16 19   No results for input(s): AMMONIA in the last 168 hours. Coagulation Profile: No results for input(s): INR, PROTIME in the last 168 hours. Cardiac Enzymes:  Recent Labs Lab 01/25/16 1419 01/25/16 1903 01/26/16 0312  TROPONINI <0.03 <0.03 <0.03   BNP (last 3 results) No results for input(s): PROBNP in the last 8760 hours. HbA1C: No results for input(s): HGBA1C in the last 72 hours. CBG: No results for input(s): GLUCAP in the last 168 hours. Lipid Profile: No results for input(s): CHOL, HDL, LDLCALC, TRIG, CHOLHDL, LDLDIRECT in the last 72 hours. Thyroid Function Tests: No results for input(s): TSH, T4TOTAL, FREET4, T3FREE, THYROIDAB in the last 72 hours. Anemia Panel: No results for input(s): VITAMINB12, FOLATE, FERRITIN, TIBC, IRON, RETICCTPCT in the last 72 hours. Urine analysis:    Component Value Date/Time   COLORURINE YELLOW 01/28/2016 2109   APPEARANCEUR CLEAR 01/28/2016 2109   LABSPEC 1.013 01/28/2016 2109   PHURINE 7.0 01/28/2016 2109   GLUCOSEU NEGATIVE 01/28/2016 2109   HGBUR NEGATIVE 01/28/2016 2109   BILIRUBINUR NEGATIVE 01/28/2016 2109   KETONESUR NEGATIVE 01/28/2016 2109   PROTEINUR NEGATIVE 01/28/2016 2109   UROBILINOGEN 0.2 04/26/2014 1908   NITRITE NEGATIVE 01/28/2016 2109   LEUKOCYTESUR NEGATIVE 01/28/2016 2109   Sepsis Labs: @LABRCNTIP (procalcitonin:4,lacticidven:4) ) Recent Results (from the past 240 hour(s))  Blood culture (routine x 2)     Status: None (Preliminary result)   Collection Time:  01/24/16  8:40 AM  Result Value Ref Range Status   Specimen Description BLOOD BLOOD LEFT FOREARM  Final   Special Requests BOTTLES DRAWN AEROBIC AND ANAEROBIC 5CC  Final   Culture   Final    NO GROWTH 4 DAYS Performed at [facility]    Report Status PENDING  Incomplete  Urine culture     Status: None   Collection Time: 01/24/16  8:45 AM  Result Value Ref Range Status   Specimen Description URINE, CLEAN CATCH  Final   Special Requests NONE  Final   Culture NO GROWTH Performed at [facility]   Final   Report Status 01/25/2016 FINAL  Final  Blood culture (routine x 2)     Status: None (Preliminary result)   Collection Time: 01/24/16  8:50 AM  Result Value Ref Range Status   Specimen Description BLOOD RT HAND  Final   Special Requests BOTTLES DRAWN AEROBIC ONLY  5CC  Final   Culture   Final    NO GROWTH 4 DAYS Performed at [facility]    Report Status PENDING  Incomplete     Radiological Exams on Admission: Ct Abdomen Pelvis W Contrast  01/29/2016  CLINICAL DATA:  55 year old female with severe abdominal pain. EXAM: CT ABDOMEN AND PELVIS WITH CONTRAST TECHNIQUE: Multidetector CT imaging of the abdomen and pelvis was performed using the standard protocol following bolus administration of intravenous contrast. CONTRAST:  ISOVUE-300 IOPAMIDOL (ISOVUE-300) INJECTION 61% COMPARISON:  CT dated 01/24/2016 FINDINGS: Minimal bibasilar dependent atelectatic changes of the lungs. No intra-abdominal free air small free fluid within the pelvis and along the left pericolic gutter. There is slight irregularity of the hepatic contour concerning for mild cirrhosis. Correlation with clinical exam and LFTs recommended. There is moderate distention of the gallbladder. No calcified stone identified. No pericholecystic fluid. There is a 6 mm lesion in the inferior aspect the body of the pancreas with fat attenuation most compatible with interspersed fat. This is similar to the  prior study.  The pancreas is otherwise unremarkable. The spleen, adrenal glands, kidneys, visualized ureters, and urinary bladder appear unremarkable. The uterus and ovaries are grossly unremarkable. There is extensive colonic diverticulosis primarily involving the descending and sigmoid colon. There is inflammatory changes and thickening of the distal descending and sigmoid colon with pericolonic stranding compatible with diverticulitis. There is progression of the diverticulitis compared to the prior CT. No drainable fluid collection/abscess or evidence of perforation. There is no bowel obstruction. Appendectomy. The abdominal aorta and IVC appear unremarkable. The origins of the celiac axis, SMA, IMA as well as the origins of the renal arteries are patent. The SMV, splenic vein, and main portal veins are patent. No portal venous gas identified. There is no adenopathy. The abdominal wall soft tissues appear unremarkable. There is degenerative changes of the spine. No acute fracture. IMPRESSION: Worsening of sigmoid diverticulitis. No diverticular abscess or evidence of perforation. Electronically Signed   By: Elgie CollardArash  Radparvar M.D.   On: 01/29/2016 00:04     EKG: Not done in ED, will get one.   Assessment/Plan Principal Problem:   Diverticulitis of large intestine without perforation or abscess without bleeding Active Problems:   MDD (major depressive disorder), recurrent severe, without psychosis (HCC)   Generalized anxiety disorder   Asthma in adult   Diverticulitis   Diverticulitis large intestine   Diverticulitis of large intestine without perforation or abscess without bleeding: Patient has worsening abdominal pain. A CT abdomen/pelvis worsening diverticulitis, but no abscess or perforation. Patient is not septic on admission. Lactate is normal. Hemodynamically stable. -will place on med-surg bed -clear liquid diet -pain control: When necessary tramadol for moderate pain and Dilaudid for  severe pain -When necessary Zofran for nausea -Start IV Cipro and Flagyl -When necessary MiraLAX -IVF: 2L NS and then 100 cc/h -If no improvement, may need to consult surgeon for possible elective colectomy  Depression and anxiety: Stable, no suicidal or homicidal ideations. -Continue home medications: Wellbutrin  GERD: -Pepcid IV  Asthma in adult: stable. No intervening or rhonchi on auscultation. -prn albuterol nebulizer   DVT ppx: SQ Lovenox Code Status: Full code Family Communication: None at bed side.   Disposition Plan:  Anticipate discharge back to previous home environment Consults called:  none Admission status: medical floor/obs  Date of Service 01/29/2016    Lorretta HarpIU, Caydon Feasel [facility] Pager [phone]  If 7PM-7AM, please contact night-coverage [email or web address] Password [email or web address] 01/29/2016, 12:57 AM

## 2016-01-30 DIAGNOSIS — M7989 Other specified soft tissue disorders: Secondary | ICD-10-CM | POA: Insufficient documentation

## 2016-01-30 DIAGNOSIS — R079 Chest pain, unspecified: Secondary | ICD-10-CM

## 2016-01-30 LAB — BASIC METABOLIC PANEL
ANION GAP: 3 — AB (ref 5–15)
BUN: <5 mg/dL — ABNORMAL LOW (ref 6–20)
CALCIUM: 7.9 mg/dL — AB (ref 8.9–10.3)
CO2: 24 mmol/L (ref 22–32)
Chloride: 113 mmol/L — ABNORMAL HIGH (ref 101–111)
Creatinine, Ser: 0.79 mg/dL (ref 0.44–1.00)
GFR calc non Af Amer: >60 mL/min (ref >60)
Glucose, Bld: 90 mg/dL (ref 65–99)
Potassium: 3.4 mmol/L — ABNORMAL LOW (ref 3.5–5.1)
Sodium: 140 mmol/L (ref 135–145)

## 2016-01-30 LAB — CBC WITH DIFFERENTIAL/PLATELET
Basophils Absolute: 0 10*3/uL (ref 0.0–0.1)
Basophils Relative: 1 %
EOS PCT: 2 %
Eosinophils Absolute: 0.1 10*3/uL (ref 0.0–0.7)
HCT: 33.9 % — ABNORMAL LOW (ref 36.0–46.0)
Hemoglobin: 10.4 g/dL — ABNORMAL LOW (ref 12.0–15.0)
LYMPHS ABS: 1.2 10*3/uL (ref 0.7–4.0)
LYMPHS PCT: 22 %
MCH: 28.5 pg (ref 26.0–34.0)
MCHC: 30.7 g/dL (ref 30.0–36.0)
MCV: 92.9 fL (ref 78.0–100.0)
MONO ABS: 0.4 10*3/uL (ref 0.1–1.0)
MONOS PCT: 7 %
Neutro Abs: 3.7 10*3/uL (ref 1.7–7.7)
Neutrophils Relative %: 68 %
PLATELETS: 251 10*3/uL (ref 150–400)
RBC: 3.65 MIL/uL — AB (ref 3.87–5.11)
RDW: 12.7 % (ref 11.5–15.5)
WBC: 5.4 10*3/uL (ref 4.0–10.5)

## 2016-01-30 LAB — TROPONIN I: Troponin I: <0.03 ng/mL (ref <0.03)

## 2016-01-30 LAB — MAGNESIUM: Magnesium: 1.9 mg/dL (ref 1.7–2.4)

## 2016-01-30 MED ORDER — POTASSIUM CHLORIDE CRYS ER 20 MEQ PO TBCR
40.0000 meq | EXTENDED_RELEASE_TABLET | Freq: Once | ORAL | Status: AC
  Administered 2016-01-30: 40 meq via ORAL
  Filled 2016-01-30: qty 2

## 2016-01-30 MED ORDER — NITROGLYCERIN 0.4 MG SL SUBL
0.4000 mg | SUBLINGUAL_TABLET | SUBLINGUAL | Status: DC | PRN
  Administered 2016-01-30 (×2): 0.4 mg via SUBLINGUAL
  Filled 2016-01-30: qty 1

## 2016-01-30 MED ORDER — ASPIRIN 325 MG PO TABS
325.0000 mg | ORAL_TABLET | Freq: Once | ORAL | Status: AC
  Administered 2016-01-30: 325 mg via ORAL
  Filled 2016-01-30: qty 1

## 2016-01-30 NOTE — Progress Notes (Signed)
PROGRESS NOTE    Francetta Ilg  ZOX:[id number] DOB: 24-Oct-1960 DOA: 01/28/2016 PCP: Allean Found, MD    Brief Narrative:  Patient is a pleasant 55 year old female history of recurrent diverticulitis, generalized anxiety disorder, major depressive disorder, asthma who was just discharged on 01/28/2016 after recurrent bout of diverticulitis presenting back with worsening abdominal pain and noted on repeat CT scan to have a worsening sigmoid diverticulitis.   Assessment & Plan:   Principal Problem:   Diverticulitis of large intestine without perforation or abscess without bleeding Active Problems:   MDD (major depressive disorder), recurrent severe, without psychosis (HCC)   Generalized anxiety disorder   Asthma in adult   Gastroesophageal reflux disease without esophagitis   Diverticulitis   Diverticulitis large intestine  #1 recurrent worsening sigmoid diverticulitis Patient was just discharged 01/28/2016 after being treated for recurrent acute sigmoid diverticulitis in stable and improved condition on a full liquid diet and on oral Augmentin to complete a course of antibiotic treatment. Patient returned several hours after discharge with worsening abdominal pain after drinking a milkshake. Repeat CT scan done on admission showed worsening sigmoid diverticulitis. Patient was subsequently admitted into the hospital and placed on a clear liquid diet, pain management, antiemetics, supportive care. Patient also started empirically on IV ciprofloxacin and Flagyl. Patient was seen in consultation by GI who recommended continuing current treatment with IV antibiotics pain management. Continue clear liquids for another 1-2 days and then advance to full liquid diet. Patient will need to follow-up with GI as outpatient in 1-2 weeks post discharge.   #2 major depressive disorder /generalized anxiety disorder Stable. No suicidal or homicidal ideations. Continue home regimen of Wellbutrin.  #3  gastroesophageal reflux disease IV Pepcid.  #4 asthma Stable. Dulera  #5 left upper extremity swelling and coolness Likely secondary to IV infiltration. Left upper extremities warm with good pulses noted with no edema and nontender to palpation. Will cancel CT angiogram of left upper extremity. Monitor.  #6 chest pain Patient with chest pain overnight relieved by nitroglycerin. Patient denies any current chest pain. Cardiac enzymes cycled were negative. EKG with no acute ischemic changes however does show decreased voltage. Check a 2-D echo. If 2-D echo is normal no further cardiac workup is needed at this time.   DVT prophylaxis: Lovenox Code Status: Full Family Communication: Updated patient. Updated daughter Marny Lowenstein [phone] ) via telephone. Disposition Plan: Home when medically stable and tolerating oral intake( soft diets) with improvement with abdominal symptoms.   Consultants:   Gastroenterology: Dr. Evette Cristal 01/29/2016  Procedures:   CT abdomen and pelvis 01/28/2016  Antimicrobials:   IV ciprofloxacin 01/28/2016  IV Flagyl 01/28/2016   Subjective: Patient complaining of significant abdominal pain more in the left lower quadrant which has improved some since admission. Patient also complained of some chest pain overnight which has since resolved. Events overnight noted with coolness in patient's left upper extremity. Patient states coolness has resolved and left upper extremities warm and less swollen than last night. Patient states left upper extremity did have an IV in that was removed.  Objective: Filed Vitals:   01/29/16 0457 01/29/16 1319 01/29/16 2213 01/30/16 0516  BP: 106/45 114/45 109/46 116/55  Pulse: 76 66 62 57  Temp: 99.1 F (37.3 C) 98.7 F (37.1 C) 99 F (37.2 C) 98 F (36.7 C)  TempSrc: Oral Oral Oral Oral  Resp: 18 18 16 16   Height: 5\' 7"  (1.702 m)     Weight: 77.202 kg (170 lb 3.2 oz)  SpO2: 98% 99% 99% 97%    Intake/Output  Summary (Last 24 hours) at 01/30/16 1149 Last data filed at 01/30/16 0558  Gross per 24 hour  Intake 2446.67 ml  Output      0 ml  Net 2446.67 ml   Filed Weights   01/28/16 2101 01/29/16 0457  Weight: 76.885 kg (169 lb 8 oz) 77.202 kg (170 lb 3.2 oz)    Examination:  General exam: Appears calm and comfortable  Respiratory system: Clear to auscultation. Respiratory effort normal. Cardiovascular system: S1 & S2 heard, RRR. No JVD, murmurs, rubs, gallops or clicks. No pedal edema. Gastrointestinal system: Abdomen is nondistended, soft and diffusely tender to palpation greater in the left lower quadrant and also the left upper quadrant. No organomegaly or masses felt. Normal bowel sounds heard. Central nervous system: Alert and oriented. No focal neurological deficits. Extremities: Symmetric 5 x 5 power. Skin: No rashes, lesions or ulcers Psychiatry: Judgement and insight appear normal. Mood & affect appropriate.     Data Reviewed: I have personally reviewed following labs and imaging studies  CBC:  Recent Labs Lab 01/24/16 0845  01/26/16 0312 01/27/16 0523 01/28/16 2112 01/29/16 0508 01/30/16 0642  WBC 9.6  < > 6.6 5.9 9.6 10.9* 5.4  NEUTROABS 7.4  --   --   --   --   --  3.7  HGB 13.3  < > 11.4* 10.9* 12.7 11.5* 10.4*  HCT 40.7  < > 35.4* 34.4* 39.6 36.4 33.9*  MCV 92.1  < > 91.9 92.2 91.2 91.5 92.9  PLT 321  < > 255 263 371 299 251  < > = values in this interval not displayed. Basic Metabolic Panel:  Recent Labs Lab 01/24/16 1838  01/27/16 0523 01/28/16 0625 01/28/16 2112 01/29/16 0508 01/30/16 0642  NA  --   < > 141 142 141 136 140  K  --   < > 4.0 3.7 4.0 3.8 3.4*  CL  --   < > 113* 108 109 108 113*  CO2  --   < > GLUCOSE  --   < > 91 89 97 117* 90  BUN  --   < > <5* <5* <5* <5* <5*  CREATININE  --   < > 0.86 0.80 0.97 0.87 0.79  CALCIUM  --   < > 8.4* 8.7* 9.5 8.0* 7.9*  MG 2.1  --   --   --   --   --  1.9  PHOS 3.2  --   --   --   --    --   --   < > = values in this interval not displayed. GFR: Estimated Creatinine Clearance: 84 mL/min (by C-G formula based on Cr of 0.79). Liver Function Tests:  Recent Labs Lab 01/24/16 0845 01/25/16 0557 01/26/16 0312 01/28/16 2112  AST 31 41 30 24  ALT ALKPHOS 59 57 50 63  BILITOT 0.6 0.8 0.6 0.4  PROT 7.2 5.9* 5.7* 7.2  ALBUMIN 3.7 2.9* 2.7* 3.2*    Recent Labs Lab 01/24/16 0845 01/28/16 2112  LIPASE 16 19   No results for input(s): AMMONIA in the last 168 hours. Coagulation Profile: No results for input(s): INR, PROTIME in the last 168 hours. Cardiac Enzymes:  Recent Labs Lab 01/25/16 1419 01/25/16 1903 01/26/16 0312 01/30/16 0127 01/30/16 0642  TROPONINI <0.03 <0.03 <0.03 <0.03 <0.03   BNP (last 3 results) No results for input(s):  PROBNP in the last 8760 hours. HbA1C: No results for input(s): HGBA1C in the last 72 hours. CBG: No results for input(s): GLUCAP in the last 168 hours. Lipid Profile: No results for input(s): CHOL, HDL, LDLCALC, TRIG, CHOLHDL, LDLDIRECT in the last 72 hours. Thyroid Function Tests: No results for input(s): TSH, T4TOTAL, FREET4, T3FREE, THYROIDAB in the last 72 hours. Anemia Panel: No results for input(s): VITAMINB12, FOLATE, FERRITIN, TIBC, IRON, RETICCTPCT in the last 72 hours. Sepsis Labs:  Recent Labs Lab 01/28/16 2249  LATICACIDVEN 0.73    Recent Results (from the past 240 hour(s))  Blood culture (routine x 2)     Status: None   Collection Time: 01/24/16  8:40 AM  Result Value Ref Range Status   Specimen Description BLOOD BLOOD LEFT FOREARM  Final   Special Requests BOTTLES DRAWN AEROBIC AND ANAEROBIC 5CC  Final   Culture   Final    NO GROWTH 5 DAYS [facility]    Report Status 01/29/2016 FINAL  Final  Urine culture     Status: None   Collection Time: 01/24/16  8:45 AM  Result Value Ref Range Status   Specimen Description URINE, CLEAN CATCH  Final   Special Requests NONE   Final   Culture NO GROWTH [facility]formed [facility]   Final   Report Status 01/25/2016 FINAL  Final  Blood culture (routine x 2)     Status: None   Collection Time: 01/24/16  8:50 AM  Result Value Ref Range Status   Specimen Description BLOOD RT HAND  Final   Special Requests BOTTLES DRAWN AEROBIC ONLY  5CC  Final   Culture   Final    NO GROWTH 5 DAYS Performed at [facility] [place] [facility]    Report Status 01/29/2016 FINAL  Final         Radiology Studies: Ct Abdomen Pelvis W Contrast  01/29/2016  CLINICAL DATA:  55 year old female with severe abdominal pain. EXAM: CT ABDOMEN AND PELVIS WITH CONTRAST TECHNIQUE: Multidetector CT imaging of the abdomen and pelvis was performed using the standard protocol following bolus administration of intravenous contrast. CONTRAST:  100mL ISOVUE-300 IOPAMIDOL (ISOVUE-300) INJECTION 61% COMPARISON:  CT dated 01/24/2016 FINDINGS: Minimal bibasilar dependent atelectatic changes of the lungs. No intra-abdominal free air small free fluid within the pelvis and along the left pericolic gutter. There is slight irregularity of the hepatic contour concerning for mild cirrhosis. Correlation with clinical exam and LFTs recommended. There is moderate distention of the gallbladder. No calcified stone identified. No pericholecystic fluid. There is a 6 mm lesion in the inferior aspect the body of the pancreas with fat attenuation most compatible with interspersed fat. This is similar to the prior study. The pancreas is otherwise unremarkable. The spleen, adrenal glands, kidneys, visualized ureters, and urinary bladder appear unremarkable. The uterus and ovaries are grossly unremarkable. There is extensive colonic diverticulosis primarily involving the descending and sigmoid colon. There is inflammatory changes and thickening of the distal descending and sigmoid colon with pericolonic stranding compatible with diverticulitis. There is progression of the diverticulitis  compared to the prior CT. No drainable fluid collection/abscess or evidence of perforation. There is no bowel obstruction. Appendectomy. The abdominal aorta and IVC appear unremarkable. The origins of the celiac axis, SMA, IMA as well as the origins of the renal arteries are patent. The SMV, splenic vein, and main portal veins are patent. No portal venous gas identified. There is no adenopathy. The abdominal wall soft tissues appear unremarkable. There  is degenerative changes of the spine. No acute fracture. IMPRESSION: Worsening of sigmoid diverticulitis. No diverticular abscess or evidence of perforation. Electronically Signed   By: Elgie Collard M.D.   On: 01/29/2016 00:04        Scheduled Meds: . buPROPion  150 mg Oral q morning - 10a  . ciprofloxacin  400 mg Intravenous Q12H  . enoxaparin (LOVENOX) injection  40 mg Subcutaneous Q24H  . famotidine (PEPCID) IV  20 mg Intravenous Q12H  . metronidazole  500 mg Intravenous Q8H  . mometasone-formoterol  2 puff Inhalation BID   Continuous Infusions: . sodium chloride 75 mL/hr at 01/30/16 0747     LOS: 1 day    Time spent: 40 MINS    Saher Davee, MD [facility] Pager [phone]  If 7PM-7AM, please contact night-coverage [email or web address] Password [facility] 01/30/2016, 11:49 AM

## 2016-01-30 NOTE — Care Management Note (Signed)
Case Management Note  Patient Details  Name: Tara [name] MRN: [id number] Date of Birth: 09/12/1960  Subjective/Objective:                 Patient DC'd 7-15, returned to ED in worsening pain to abd a few hours after DC. Readmitted with diverticulitis. Slowly advancing diet, following pain closely. No DC needs identified.    Action/Plan:  CM will continue to follow for DC needs.  Expected Discharge Date:                  Expected Discharge Plan:  Home/Self Care  In-House Referral:     Discharge planning Services  CM Consult  Post Acute Care Choice:    Choice offered to:     DME Arranged:    DME Agency:     HH Arranged:    HH Agency:     Status of Service:  In process, will continue to follow  If discussed at Long Length of Stay Meetings, dates discussed:    Additional Comments:  Lawerance SabalDebbie Zakk Borgen, RN 01/30/2016, 4:23 PM

## 2016-01-30 NOTE — Progress Notes (Addendum)
Pt is having chest pain, she describes as cramping pain. Night MD coverage [place], [place] notified. Troponins, aspirin, and Nitro were ordered. Pt had 2 doses of NItro and Aspirin. Pt was having some headache, Tylenol and Dilaudid were given. Pt reports feeling better after reassessment after all of the intervention. Will continue to monitor.  Pt's left hand was cold upon IV assessment at the beginning of the shift. Pt reported that it had been cold all day. The IV site was normal to touch and was not painful for the pt. However, the palm and the fingers were cold. The radial pulse is moderate and capillary refill was less than 3 secs on assessment. Upon reassessment again during the shift, the hand was still cold and assessment unchanged. The IV was taken out and another started on the right arm. PA notified and requested if the hand can be dopplered. CT scan was ordered. Will continue to monitor.

## 2016-01-30 NOTE — Progress Notes (Signed)
Milinda PointerJill Hoffmaster 11:49 AM  Subjective: Patient doing fine without any complaints currently and we reviewed her diverticuli history and her hospitalchart was reviewed and her case discussed with my partner Dr. Evette CristalGanem and she hasoccasional constipation at home and does not eat popcorn but does eat nuts which we discussed and we discussed elective surgical optionsb as well Objective: Vital signs stable afebrile no acute distress very minimal eft lower quadrant discomfort with good bowel sounds soft abdomen and decreased white count  Assessment: diverticulitis  Plan: Continue clear liquids for another day or 2 and follow-up in a week or 2 with my partner Dr. Madilyn FiremanHayes and please call us if any question or problem with this hospital stay  [facility] Hamlet[facility],Yoandri Congrove E  Pager [phone] After 5PM or if no answer call [phone]

## 2016-01-31 ENCOUNTER — Inpatient Hospital Stay (HOSPITAL_COMMUNITY): Payer: 59

## 2016-01-31 DIAGNOSIS — R079 Chest pain, unspecified: Secondary | ICD-10-CM

## 2016-01-31 LAB — BASIC METABOLIC PANEL
ANION GAP: 4 — AB (ref 5–15)
CO2: 25 mmol/L (ref 22–32)
Calcium: 8.7 mg/dL — ABNORMAL LOW (ref 8.9–10.3)
Chloride: 111 mmol/L (ref 101–111)
Creatinine, Ser: 0.87 mg/dL (ref 0.44–1.00)
Glucose, Bld: 78 mg/dL (ref 65–99)
POTASSIUM: 4.1 mmol/L (ref 3.5–5.1)
SODIUM: 140 mmol/L (ref 135–145)

## 2016-01-31 LAB — CBC
HCT: 36.7 % (ref 36.0–46.0)
HEMOGLOBIN: 11.7 g/dL — AB (ref 12.0–15.0)
MCH: 29.2 pg (ref 26.0–34.0)
MCHC: 31.9 g/dL (ref 30.0–36.0)
MCV: 91.5 fL (ref 78.0–100.0)
Platelets: 322 10*3/uL (ref 150–400)
RBC: 4.01 MIL/uL (ref 3.87–5.11)
RDW: 12.6 % (ref 11.5–15.5)
WBC: 5.4 10*3/uL (ref 4.0–10.5)

## 2016-01-31 LAB — ECHOCARDIOGRAM COMPLETE
HEIGHTINCHES: 67 in
Weight: 2723.2 oz

## 2016-01-31 MED ORDER — NYSTATIN 100000 UNIT/ML MT SUSP
5.0000 mL | Freq: Four times a day (QID) | OROMUCOSAL | Status: DC
  Administered 2016-01-31 – 2016-02-01 (×3): 500000 [IU] via ORAL
  Filled 2016-01-31 (×3): qty 5

## 2016-01-31 NOTE — Progress Notes (Signed)
PROGRESS NOTE    Tara PointerJill [name]  GNF:[id number] DOB: 04/26/1961 DOA: 01/28/2016 PCP: Allean FoundSMITH,Tara THIELE, MD    Brief Narrative:  Patient is a pleasant 55 year old female history of recurrent diverticulitis, generalized anxiety disorder, major depressive disorder, asthma who was just discharged on 01/28/2016 after recurrent bout of diverticulitis presenting back with worsening abdominal pain and noted on repeat CT scan to have a worsening sigmoid diverticulitis. Patient admitted placed empirically on IV ciprofloxacin and IV Flagyl. Patient on clear liquid diet. GI following.   Assessment & Plan:   Principal Problem:   Diverticulitis of large intestine without perforation or abscess without bleeding Active Problems:   MDD (major depressive disorder), recurrent severe, without psychosis (HCC)   Generalized anxiety disorder   Asthma in adult   Gastroesophageal reflux disease without esophagitis   Diverticulitis   Diverticulitis large intestine   Left upper extremity swelling  #1 recurrent worsening sigmoid diverticulitis Patient was just discharged 01/28/2016 after being treated for recurrent acute sigmoid diverticulitis in stable and improved condition on a full liquid diet and on oral Augmentin to complete a course of antibiotic treatment. Patient returned several hours after discharge with worsening abdominal pain after drinking a milkshake. Repeat CT scan done on admission showed worsening sigmoid diverticulitis. Patient was subsequently admitted into the hospital and placed on a clear liquid diet, pain management, antiemetics, supportive care. Patient also started empirically on IV ciprofloxacin and Flagyl. Patient was seen in consultation by GI who recommended continuing current treatment with IV antibiotics pain management. Continue clear liquids for another days and then advance to full liquid diet for breakfast 02/01/2016. Patient will need to follow-up with GI as outpatient in 1-2  weeks post discharge.   #2 major depressive disorder /generalized anxiety disorder Stable. No suicidal or homicidal ideations. Continue home regimen of Wellbutrin.  #3 gastroesophageal reflux disease IV Pepcid.  #4 asthma Stable. Dulera  #5 left upper extremity swelling and coolness Likely secondary to IV infiltration. Left upper extremities warm with good pulses noted with no edema and nontender to palpation.  CT angiogram of left upper extremity has been cancelled. Monitor.  #6 chest pain Patient with chest pain night of 01/29/2016, relieved by nitroglycerin. Patient denies any current chest pain. Cardiac enzymes cycled were negative. EKG with no acute ischemic changes however does show decreased voltage. 2-D echo with EF of 60-65%. No wall motion abnormalities. Trivial pericardial effusion identified circumferential to the heart. No further cardiac workup needed.    DVT prophylaxis: Lovenox Code Status: Full Family Communication: Updated patient. Updated daughter Tara LowensteinMarcy [name] [phone] ) via telephone. Disposition Plan: Home when medically stable and tolerating oral intake( soft diets) with improvement with abdominal symptoms.   Consultants:   Gastroenterology: Dr. Evette CristalGanem 01/29/2016    Procedures:   CT abdomen and pelvis 01/28/2016 2-D echo 01/31/2016  Antimicrobials:   IV ciprofloxacin 01/28/2016  IV Flagyl 01/28/2016   Subjective: Patient sitting up in chair. Patient states feels better. Abdominal pain improved. No nausea. No vomiting. Tolerating clear liquids.  Objective: Filed Vitals:   01/30/16 0516 01/30/16 1333 01/30/16 2111 01/31/16 0548  BP: 116/55 130/55 123/54 126/52  Pulse: 57 59 58 50  Temp: 98 F (36.7 C) 98.8 F (37.1 C) 98.1 F (36.7 C) 98.7 F (37.1 C)  TempSrc: Oral Oral Oral Oral  Resp: 16 20 16 16   Height:      Weight:      SpO2: 97% 100% 97% 97%    Intake/Output Summary (Last 24 hours) at 01/31/16 1246  Last data filed at  01/31/16 0651  Gross per 24 hour  Intake   1250 ml  Output      0 ml  Net   1250 ml   Filed Weights   01/28/16 2101 01/29/16 0457  Weight: 76.885 kg (169 lb 8 oz) 77.202 kg (170 lb 3.2 oz)    Examination:  General exam: Appears calm and comfortable  Respiratory system: Clear to auscultation. Respiratory effort normal. Cardiovascular system: S1 & S2 heard, RRR. No JVD, murmurs, rubs, gallops or clicks. No pedal edema. Gastrointestinal system: Abdomen is nondistended, soft and diffusely tender to palpation greater in the left lower quadrant and also the left upper quadrant. No organomegaly or masses felt. Normal bowel sounds heard. Central nervous system: Alert and oriented. No focal neurological deficits. Extremities: Symmetric 5 x 5 power. Skin: No rashes, lesions or ulcers Psychiatry: Judgement and insight appear normal. Mood & affect appropriate.     Data Reviewed: I have personally reviewed following labs and imaging studies  CBC:  Recent Labs Lab 01/27/16 0523 01/28/16 2112 01/29/16 0508 01/30/16 0642 01/31/16 0813  WBC 5.9 9.6 10.9* 5.4 5.4  NEUTROABS  --   --   --  3.7  --   HGB 10.9* 12.7 11.5* 10.4* 11.7*  HCT 34.4* 39.6 36.4 33.9* 36.7  MCV 92.2 91.2 91.5 92.9 91.5  PLT 263 371 299 251 322   Basic Metabolic Panel:  Recent Labs Lab 01/24/16 1838  01/28/16 0625 01/28/16 2112 01/29/16 0508 01/30/16 0642 01/31/16 0813  NA  --   < > 142 141 136 140 140  K  --   < > 3.7 4.0 3.8 3.4* 4.1  CL  --   < > 108 109 108 113* 111  CO2  --   < > GLUCOSE  --   < > 89 97 117* 90 78  BUN  --   < > <5* <5* <5* <5* <5*  CREATININE  --   < > 0.80 0.97 0.87 0.79 0.87  CALCIUM  --   < > 8.7* 9.5 8.0* 7.9* 8.7*  MG 2.1  --   --   --   --  1.9  --   PHOS 3.2  --   --   --   --   --   --   < > = values in this interval not displayed. GFR: Estimated Creatinine Clearance: 77.3 mL/min (by C-G formula based on Cr of 0.87). Liver Function Tests:  Recent  Labs Lab 01/25/16 0557 01/26/16 0312 01/28/16 2112  AST 41 30 24  ALT ALKPHOS 57 50 63  BILITOT 0.8 0.6 0.4  PROT 5.9* 5.7* 7.2  ALBUMIN 2.9* 2.7* 3.2*    Recent Labs Lab 01/28/16 2112  LIPASE 19   No results for input(s): AMMONIA in the last 168 hours. Coagulation Profile: No results for input(s): INR, PROTIME in the last 168 hours. Cardiac Enzymes:  Recent Labs Lab 01/25/16 1903 01/26/16 0312 01/30/16 0127 01/30/16 0642 01/30/16 1225  TROPONINI <0.03 <0.03 <0.03 <0.03 <0.03   BNP (last 3 results) No results for input(s): PROBNP in the last 8760 hours. HbA1C: No results for input(s): HGBA1C in the last 72 hours. CBG: No results for input(s): GLUCAP in the last 168 hours. Lipid Profile: No results for input(s): CHOL, HDL, LDLCALC, TRIG, CHOLHDL, LDLDIRECT in the last 72 hours. Thyroid Function Tests: No results for input(s): TSH, T4TOTAL, FREET4, T3FREE, THYROIDAB in  the last 72 hours. Anemia Panel: No results for input(s): VITAMINB12, FOLATE, FERRITIN, TIBC, IRON, RETICCTPCT in the last 72 hours. Sepsis Labs:  Recent Labs Lab 01/28/16 2249  LATICACIDVEN 0.73    Recent Results (from the past 240 hour(s))  Blood culture (routine x 2)     Status: None   Collection Time: 01/24/16  8:40 AM  Result Value Ref Range Status   Specimen Description BLOOD BLOOD LEFT FOREARM  Final   Special Requests BOTTLES DRAWN AEROBIC AND ANAEROBIC 5CC  Final   Culture   Final    NO GROWTH 5 DAYS Performed at [facility]    Report Status 01/29/2016 FINAL  Final  Urine culture     Status: None   Collection Time: 01/24/16  8:45 AM  Result Value Ref Range Status   Specimen Description URINE, CLEAN CATCH  Final   Special Requests NONE  Final   Culture NO GROWTH Performed at [facility]   Final   Report Status 01/25/2016 FINAL  Final  Blood culture (routine x 2)     Status: None   Collection Time: 01/24/16  8:50 AM  Result Value Ref Range Status     Specimen Description BLOOD RT HAND  Final   Special Requests BOTTLES DRAWN AEROBIC ONLY  5CC  Final   Culture   Final    NO GROWTH 5 DAYS Performed at [facility]    Report Status 01/29/2016 FINAL  Final         Radiology Studies: No results found.      Scheduled Meds: . buPROPion  150 mg Oral q morning - 10a  . ciprofloxacin  400 mg Intravenous Q12H  . enoxaparin (LOVENOX) injection  40 mg Subcutaneous Q24H  . famotidine (PEPCID) IV  20 mg Intravenous Q12H  . metronidazole  500 mg Intravenous Q8H  . mometasone-formoterol  2 puff Inhalation BID   Continuous Infusions: . sodium chloride 75 mL/hr at 01/30/16 1645     LOS: 2 days    Time spent: 40 MINS    Adith Tejada, MD [facility] Pager [phone]  If 7PM-7AM, please contact night-coverage [email or web address] Password [facility] 01/31/2016, 12:46 PM

## 2016-01-31 NOTE — Progress Notes (Signed)
Nutrition Education Note  RD consulted for nutrition education regarding nutrition management for diverticulosis/ Diverticulitis.    RD provided "Low Fiber Nutrition Therapy" handout as well as "5 Sample Menus for Gradually Increasing Fiber" handout from the Academy of [employer] and [employer]etics. Reviewed patient's dietary recall and discussed ways for pt to meet nutrition goals over the next several weeks. Explained reasons for pt to follow a low fiber diet over the next few weeks until symptoms improve and gut has healed. Reviewed low fiber foods and high fiber foods. Discussed best practice for long term management of diverticulosis is a high fiber diet and discussed ways to gradually increase fiber in the diet. Recommended that patient take a daily probiotic supplement or consume foods with probiotics daily.   Teach back method used. Pt verbalizes understanding of information provided.   Expect good compliance.  Body mass index is Body mass index is 26.65 kg/(m^2).Marland Kitchen. Pt meets criteria for Overweight based on current BMI.  Current diet order is Clear liquids, patient is consuming approximately 75% of meals at this time. She is looking forward to being started on full liquids tomorrow and eventually solid food. Labs and medications reviewed. No further nutrition interventions warranted at this time. RD contact information provided. If additional nutrition issues arise, please re-consult RD.  Dorothea Ogleeanne Lunna Vogelgesang RD, LDN Inpatient Clinical Dietitian Pager: [phone] After Hours Pager: [phone]

## 2016-01-31 NOTE — Progress Notes (Signed)
Echocardiogram 2D Echocardiogram has been performed.  Marisue Humblelexis N Jyra Lagares 01/31/2016, 9:06 AM

## 2016-01-31 NOTE — Progress Notes (Signed)
Milinda PointerJill Veltri 8:50 AM  Subjective: Patient doing well with less pain and wants to eat which we discussed and we reviewed her previous hospital stay and discussed possible diverticular surgery and answered all of her questions and she is about to have an echocardiogram for her chest pain which she says is probably anxiety  Objective: Vital signs stable afebrile no acute distress abdomen is soft decreased tender no guarding or rebound no new labs  Assessment: Diverticulitis  Plan: Would treat one more day with clear liquids and if doing well tomorrow would slowly advance diet then and if she does well would recommend outpatient surgical consultation and repeat colonoscopy in 6 weeks prior to surgical options if planned just to be sure  [facility],Jermiya Reichl E  Pager [phone] After 5PM or if no answer call [phone]

## 2016-01-31 NOTE — Progress Notes (Signed)
Benadryl given for generalized rash noted. Pt. Is not complaining of any difficulty breathing or itching at this time. Will continue to monitor.

## 2016-02-01 DIAGNOSIS — K5732 Diverticulitis of large intestine without perforation or abscess without bleeding: Principal | ICD-10-CM

## 2016-02-01 LAB — COMPREHENSIVE METABOLIC PANEL
ALK PHOS: 44 U/L (ref 38–126)
ALT: 17 U/L (ref 14–54)
AST: 22 U/L (ref 15–41)
Albumin: 2.6 g/dL — ABNORMAL LOW (ref 3.5–5.0)
Anion gap: 8 (ref 5–15)
BILIRUBIN TOTAL: 0.6 mg/dL (ref 0.3–1.2)
BUN: <5 mg/dL — ABNORMAL LOW (ref 6–20)
CALCIUM: 8.4 mg/dL — AB (ref 8.9–10.3)
CO2: 25 mmol/L (ref 22–32)
CREATININE: 0.92 mg/dL (ref 0.44–1.00)
Chloride: 107 mmol/L (ref 101–111)
Glucose, Bld: 82 mg/dL (ref 65–99)
Potassium: 3.6 mmol/L (ref 3.5–5.1)
Sodium: 140 mmol/L (ref 135–145)
TOTAL PROTEIN: 5.7 g/dL — AB (ref 6.5–8.1)

## 2016-02-01 LAB — CBC
HEMATOCRIT: 33 % — AB (ref 36.0–46.0)
HEMOGLOBIN: 10.7 g/dL — AB (ref 12.0–15.0)
MCH: 29.2 pg (ref 26.0–34.0)
MCHC: 32.4 g/dL (ref 30.0–36.0)
MCV: 90.2 fL (ref 78.0–100.0)
Platelets: 307 10*3/uL (ref 150–400)
RBC: 3.66 MIL/uL — AB (ref 3.87–5.11)
RDW: 12.6 % (ref 11.5–15.5)
WBC: 3.8 10*3/uL — ABNORMAL LOW (ref 4.0–10.5)

## 2016-02-01 MED ORDER — POLYETHYLENE GLYCOL 3350 17 G PO PACK: 17.0000 g | PACK | Freq: Every day | ORAL | Status: DC | PRN

## 2016-02-01 MED ORDER — NYSTATIN 100000 UNIT/ML MT SUSP: 5.0000 mL | Freq: Four times a day (QID) | OROMUCOSAL | Status: DC

## 2016-02-01 MED ORDER — RISAQUAD PO CAPS: 1.0000 | ORAL_CAPSULE | Freq: Every day | ORAL | Status: DC

## 2016-02-01 MED ORDER — AMOXICILLIN-POT CLAVULANATE 875-125 MG PO TABS
1.0000 | ORAL_TABLET | Freq: Two times a day (BID) | ORAL | Status: DC
  Administered 2016-02-01: 1 via ORAL
  Filled 2016-02-01: qty 1

## 2016-02-01 MED ORDER — DIPHENHYDRAMINE HCL 25 MG PO CAPS: 25.0000 mg | ORAL_CAPSULE | Freq: Four times a day (QID) | ORAL | Status: DC | PRN

## 2016-02-01 NOTE — Progress Notes (Signed)
Tara [name] 11:20 AM  Subjective: Patient doing well tolerated for liquids for breakfast no abdominal pain wants to go home and has Augmentin at home  Objective: Vital signs stable afebrile no acute distress abdomen is soft nontender white count okay  Assessment: Improved diverticulitis  Plan: If doing well after lunch okay with me to go home and she will call me when necessary and continue Augmentin at home and please instruct about probiotics which she should take once a day and I will see her back next week and she could call us sooner if needed  [facility],Kymia Simi E  Pager [phone] After 5PM or if no answer call [phone]

## 2016-02-01 NOTE — Progress Notes (Signed)
Last two blood pressure readings are 107/44, HR 58 and 100/50, HR 56. Night coverage notified at 0530.

## 2016-02-01 NOTE — Discharge Summary (Signed)
Physician Discharge Summary  Tara PointerJill [name] WUJ:[id number] DOB: 11/09/1960 DOA: 01/28/2016  PCP: Allean FoundSMITH,[name], MD  Admit date: 01/28/2016 Discharge date: 02/01/2016  Time spent: 35 minutes  Recommendations for Outpatient Follow-up:  Follow up with Dr Ewing SchleinMagod for further care of diverticulitis  Discharge Diagnoses:    Diverticulitis of large intestine without perforation or abscess without bleeding   MDD (major depressive disorder), recurrent severe, without psychosis (HCC)   Generalized anxiety disorder   Asthma in adult   Gastroesophageal reflux disease without esophagitis   Diverticulitis   Diverticulitis large intestine   Left upper extremity swelling   Discharge Condition: stable  Diet recommendation: full liquid, she will advanced as tolerated to low residue diet   Filed Weights   01/28/16 2101 01/29/16 0457  Weight: 76.885 kg (169 lb 8 oz) 77.202 kg (170 lb 3.2 oz)    History of present illness:  Patient is a pleasant 55 year old female history of recurrent diverticulitis, generalized anxiety disorder, major depressive disorder, asthma who was just discharged on 01/28/2016 after recurrent bout of diverticulitis presenting back with worsening abdominal pain and noted on repeat CT scan to have a worsening sigmoid diverticulitis. Patient admitted placed empirically on IV ciprofloxacin and IV Flagyl.   Hospital Course:  Brief Narrative:  Patient is a pleasant 55 year old female history of recurrent diverticulitis, generalized anxiety disorder, major depressive disorder, asthma who was just discharged on 01/28/2016 after recurrent bout of diverticulitis presenting back with worsening abdominal pain and noted on repeat CT scan to have a worsening sigmoid diverticulitis. Patient admitted placed empirically on IV ciprofloxacin and IV Flagyl. Patient on clear liquid diet. GI following.   Assessment & Plan:   1 recurrent worsening sigmoid diverticulitis Patient was just  discharged 01/28/2016 after being treated for recurrent acute sigmoid diverticulitis in stable and improved condition on a full liquid diet and on oral Augmentin to complete a course of antibiotic treatment. Patient returned several hours after discharge with worsening abdominal pain after drinking a milkshake. Repeat CT scan done on admission showed worsening sigmoid diverticulitis. Patient was subsequently admitted into the hospital and placed on a clear liquid diet, pain management, antiemetics, supportive care. Patient also started empirically on IV ciprofloxacin and Flagyl. Patient was seen in consultation by GI who recommended continuing current treatment with IV antibiotics pain management. Continue clear liquids for another days and then advance to full liquid diet for breakfast 02/01/2016. Patient will need to follow-up with GI as outpatient in 1-2 weeks post discharge.  Tolerating diet, ok to discharge on Augmentin with close follow up with Dr Ewing SchleinMagod.  Had generalized rash, unclear if related to cipro. Will discontinue cipro.   2 major depressive disorder /generalized anxiety disorder Stable. No suicidal or homicidal ideations. Continue home regimen of Wellbutrin.  #3 gastroesophageal reflux disease IV Pepcid.  #4 asthma Stable. Dulera  #5 left upper extremity swelling and coolness Likely secondary to IV infiltration. Left upper extremities warm with good pulses noted with no edema and nontender to palpation. CT angiogram of left upper extremity has been cancelled. Monitor.  #6 chest pain Patient with chest pain night of 01/29/2016, relieved by nitroglycerin. Patient denies any current chest pain. Cardiac enzymes cycled were negative. EKG with no acute ischemic changes however does show decreased voltage. 2-D echo with EF of 60-65%. No wall motion abnormalities. Trivial pericardial effusion identified circumferential to the heart. No further cardiac workup needed.    Procedures:  none  Consultations:  Magod.   Discharge Exam: Filed Vitals:  01/31/16 2233 02/01/16 0514  BP: 107/44 100/50  Pulse: 56 58  Temp: 98 F (36.7 C) 98 F (36.7 C)  Resp: 19 17    General: NAD Cardiovascular: S 1, S 2 RRR Respiratory: CTA Abdomen; soft. nt  Discharge Instructions   Discharge Instructions    Increase activity slowly    Complete by:  As directed           Current Discharge Medication List    START taking these medications   Details  acidophilus (RISAQUAD) CAPS capsule Take 1 capsule by mouth daily. Qty: 30 capsule, Refills: 0    diphenhydrAMINE (BENADRYL) 25 mg capsule Take 1 capsule (25 mg total) by mouth every 6 (six) hours as needed for itching or allergies. Qty: 30 capsule, Refills: 0    nystatin (MYCOSTATIN) 100000 UNIT/ML suspension Take 5 mLs (500,000 Units total) by mouth 4 (four) times daily. Qty: 60 mL, Refills: 0      CONTINUE these medications which have CHANGED   Details  polyethylene glycol (MIRALAX) packet Take 17 g by mouth daily as needed for mild constipation. Titrate for soft stools. Qty: 100 each, Refills: 0      CONTINUE these medications which have NOT CHANGED   Details  amoxicillin-clavulanate (AUGMENTIN) 875-125 MG tablet Take 1 tablet by mouth 2 (two) times daily. Take for 10 days then stop. Qty: 20 tablet, Refills: 0    buPROPion (WELLBUTRIN XL) 150 MG 24 hr tablet Take 150 mg by mouth every morning.     naproxen sodium (ANAPROX) 220 MG tablet Take 220 mg by mouth 2 (two) times daily as needed (FOR PAIN).    traMADol (ULTRAM) 50 MG tablet Take 1 tablet (50 mg total) by mouth every 6 (six) hours as needed. Qty: 20 tablet, Refills: 0    prochlorperazine (COMPAZINE) 10 MG tablet Take 1 tablet (10 mg total) by mouth every 6 (six) hours as needed for nausea or vomiting. Qty: 20 tablet, Refills: 0       Allergies  Allergen Reactions  . Codeine Itching    HYDROCODONE AND OXYCODONE ALSO    Follow-up Information    Follow up with Allean Found, MD In 1 week.   Specialty:  Family Medicine   Contact information:   [phone] [address] [place] [address] [phone]        The results of significant diagnostics from this hospitalization (including imaging, microbiology, ancillary and laboratory) are listed below for reference.    Significant Diagnostic Studies: Ct Abdomen Pelvis W Contrast  01/29/2016  CLINICAL DATA:  55 year old female with severe abdominal pain. EXAM: CT ABDOMEN AND PELVIS WITH CONTRAST TECHNIQUE: Multidetector CT imaging of the abdomen and pelvis was performed using the standard protocol following bolus administration of intravenous contrast. CONTRAST:  ISOVUE-300 IOPAMIDOL (ISOVUE-300) INJECTION 61% COMPARISON:  CT dated 01/24/2016 FINDINGS: Minimal bibasilar dependent atelectatic changes of the lungs. No intra-abdominal free air small free fluid within the pelvis and along the left pericolic gutter. There is slight irregularity of the hepatic contour concerning for mild cirrhosis. Correlation with clinical exam and LFTs recommended. There is moderate distention of the gallbladder. No calcified stone identified. No pericholecystic fluid. There is a 6 mm lesion in the inferior aspect the body of the pancreas with fat attenuation most compatible with interspersed fat. This is similar to the prior study. The pancreas is otherwise unremarkable. The spleen, adrenal glands, kidneys, visualized ureters, and urinary bladder appear unremarkable. The uterus and ovaries are grossly unremarkable.  There is extensive colonic diverticulosis primarily involving the descending and sigmoid colon. There is inflammatory changes and thickening of the distal descending and sigmoid colon with pericolonic stranding compatible with diverticulitis. There is progression of the diverticulitis compared to the prior CT. No drainable fluid collection/abscess or evidence  of perforation. There is no bowel obstruction. Appendectomy. The abdominal aorta and IVC appear unremarkable. The origins of the celiac axis, SMA, IMA as well as the origins of the renal arteries are patent. The SMV, splenic vein, and main portal veins are patent. No portal venous gas identified. There is no adenopathy. The abdominal wall soft tissues appear unremarkable. There is degenerative changes of the spine. No acute fracture. IMPRESSION: Worsening of sigmoid diverticulitis. No diverticular abscess or evidence of perforation. Electronically Signed   By: Elgie Collard M.D.   On: 01/29/2016 00:04   Ct Abdomen Pelvis W Contrast  01/24/2016  CLINICAL DATA:  Continued left lower quadrant pain. Status post recent treatment for diverticulitis seen on CT scan 01/17/2016. EXAM: CT ABDOMEN AND PELVIS WITH CONTRAST TECHNIQUE: Multidetector CT imaging of the abdomen and pelvis was performed using the standard protocol following bolus administration of intravenous contrast. CONTRAST:  100 ml ISOVUE-300 IOPAMIDOL (ISOVUE-300) INJECTION 61% COMPARISON:  CT abdomen and pelvis 01/17/2016 and 04/26/2014. FINDINGS: Mild dependent atelectasis is seen in the lung bases. No pleural or pericardial effusion. Very small hiatal hernia is noted. The gallbladder is somewhat distended, unchanged. No stones or inflammatory change identified. The liver, adrenal glands, spleen, kidneys and pancreas appear normal. Extensive diverticular disease of the colon is again seen. Since the prior examination, the patient has developed fluid in the left pericolic gutter. No focal fluid collection is identified. Inflammatory change about the proximal and mid sigmoid colon is improved. However, there is new wall thickening in the mid descending colon with stranding of pericolonic fat. Somewhat prominent stool burden in the ascending and transverse colon is noted. No pneumatosis, portal venous gas or free intraperitoneal air is identified. Uterus,  adnexa and urinary bladder appear normal. No focal bony abnormality is identified. IMPRESSION: Extensive colonic diverticulosis. Stranding about the proximal mid sigmoid colon seen on the prior CT scan has resolved. However, there is new fluid in the left pericolic gutter and wall thickening and stranding about the mid sigmoid colon worrisome for acute diverticulitis. No focal fluid collection to suggest abscess is identified. No perforation. Electronically Signed   By: Drusilla Kanner M.D.   On: 01/24/2016 10:27   Ct Abdomen Pelvis W Contrast  01/17/2016  CLINICAL DATA:  Left lower quadrant pain for weeks with worsening today, nausea, history of diverticulitis EXAM: CT ABDOMEN AND PELVIS WITH CONTRAST TECHNIQUE: Multidetector CT imaging of the abdomen and pelvis was performed using the standard protocol following bolus administration of intravenous contrast. CONTRAST:  ISOVUE-300 IOPAMIDOL (ISOVUE-300) INJECTION 61% COMPARISON:  04/26/2014 FINDINGS: Lower chest:  Lung bases are unremarkable. Hepatobiliary: No focal hepatic mass. There is a distended gallbladder without evidence of calcified gallstones. No intrahepatic biliary ductal dilatation. Pancreas: Enhanced pancreas is unremarkable. Spleen: No focal splenic mass. Adrenals/Urinary Tract: No adrenal gland mass. Enhanced kidneys are symmetrical in size. No hydronephrosis or hydroureter. Delayed renal images shows bilateral renal symmetrical excretion. Bilateral visualized proximal ureter is unremarkable. Stomach/Bowel: Small hiatal hernia. No small bowel obstruction. No pericecal inflammation. The patient is status post appendectomy. The terminal ileum is unremarkable. Scattered diverticula are noted descending colon. Multiple sigmoid colon diverticula. In axial image 65 there is focal thickening of colonic wall  and diverticular wall in proximal sigmoid colon. There is stranding of pericolonic fat. Findings are consistent with acute diverticulitis or  focal colitis. No evidence of diverticular abscess or perforation. No distal colonic obstruction. No extravasation of oral contrast material. Mild redundant sigmoid colon. Coronal image 33 there is a second focus of mild thickening of colonic wall in sigmoid colon proximally with mild stranding of surrounding fat. Findings suspicious for a second focus of diverticulitis. Again no evidence of pericolonic abscess. Vascular/Lymphatic: No retroperitoneal or mesenteric adenopathy. No aortic aneurysm. Reproductive: The uterus and ovaries are unremarkable. Other: Moderate distended urinary bladder. No ascites or free abdominal air. Musculoskeletal: Sagittal images of the spine shows no destructive bony lesions. There is disc space flattening with vacuum disc phenomenon mild anterior and mild posterior spurring at L3-L4 level. IMPRESSION: 1. In axial image 65 there is focal thickening of colonic wall and diverticular wall in proximal sigmoid colon. There is stranding of pericolonic fat. Findings are consistent with acute diverticulitis or focal colitis. No evidence of diverticular abscess or perforation. No distal colonic obstruction. No extravasation of oral contrast material. Mild redundant sigmoid colon. Coronal image 33 there is a second focus of mild thickening of colonic wall in sigmoid colon proximally with mild stranding of surrounding fat. Findings suspicious for a second focus of diverticulitis. 2. No hydronephrosis or hydroureter. 3. No pericecal inflammation.  Status post appendectomy. 4. A distended gallbladder is noted without evidence of calcified gallstones. 5. Moderate distended urinary bladder. 6. Degenerative changes lumbar spine at L3-L4 level. Electronically Signed   By: Natasha Mead M.D.   On: 01/17/2016 17:10   Mm Digital Diagnostic Unilat L  01/20/2016  CLINICAL DATA:  Screening recall for left breast calcifications EXAM: DIGITAL DIAGNOSTIC LEFT MAMMOGRAM COMPARISON:  Previous exam(s). ACR Breast  Density Category b: There are scattered areas of fibroglandular density. FINDINGS: In the upper outer left breast, there is a small group of calcifications primarily punctate and round, covering 5 mm. There is no associated mass or distortion. There is no linearity or branching. IMPRESSION: Probably benign left breast calcifications. Short-term follow-up recommended. RECOMMENDATION: Diagnostic left breast mammography with magnification views in 6 months. I have discussed the findings and recommendations with the patient. Results were also provided in writing at the conclusion of the visit. If applicable, a reminder letter will be sent to the patient regarding the next appointment. BI-RADS CATEGORY  3: Probably benign. Electronically Signed   By: Amie Portland M.D.   On: 01/20/2016 15:11   Mm Digital Screening Bilateral  01/13/2016  CLINICAL DATA:  Screening. EXAM: DIGITAL SCREENING BILATERAL MAMMOGRAM WITH CAD COMPARISON:  Previous exam(s). ACR Breast Density Category b: There are scattered areas of fibroglandular density. FINDINGS: In the left breast, calcifications warrant further evaluation. In the right breast, no findings suspicious for malignancy. Images were processed with CAD. IMPRESSION: Further evaluation is suggested for calcifications in the left breast. RECOMMENDATION: Diagnostic mammogram of the left breast. (Code:[id number]-40M) The patient will be contacted regarding the findings, and additional imaging will be scheduled. BI-RADS CATEGORY  0: Incomplete. Need additional imaging evaluation and/or prior mammograms for comparison. Electronically Signed   By: Beckie Salts M.D.   On: 01/13/2016 07:59    Microbiology: Recent Results (from the past 240 hour(s))  Blood culture (routine x 2)     Status: None   Collection Time: 01/24/16  8:40 AM  Result Value Ref Range Status   Specimen Description BLOOD BLOOD LEFT FOREARM  Final   Special Requests  BOTTLES DRAWN AEROBIC AND ANAEROBIC 5CC  Final    Culture   Final    NO GROWTH 5 DAYS Performed at [facility]    Report Status 01/29/2016 FINAL  Final  Urine culture     Status: None   Collection Time: 01/24/16  8:45 AM  Result Value Ref Range Status   Specimen Description URINE, CLEAN CATCH  Final   Special Requests NONE  Final   Culture NO GROWTH Performed at [facility]   Final   Report Status 01/25/2016 FINAL  Final  Blood culture (routine x 2)     Status: None   Collection Time: 01/24/16  8:50 AM  Result Value Ref Range Status   Specimen Description BLOOD RT HAND  Final   Special Requests BOTTLES DRAWN AEROBIC ONLY  5CC  Final   Culture   Final    NO GROWTH 5 DAYS Performed at [facility]    Report Status 01/29/2016 FINAL  Final     Labs: Basic Metabolic Panel:  Recent Labs Lab 01/28/16 2112 01/29/16 0508 01/30/16 0642 01/31/16 0813 02/01/16 0617  NA 141 136 140 140 140  K 4.0 3.8 3.4* 4.1 3.6  CL 109 108 113* 111 107  CO2 GLUCOSE 97 117* 90 78 82  BUN <5* <5* <5* <5* <5*  CREATININE 0.97 0.87 0.79 0.87 0.92  CALCIUM 9.5 8.0* 7.9* 8.7* 8.4*  MG  --   --  1.9  --   --    Liver Function Tests:  Recent Labs Lab 01/26/16 0312 01/28/16 2112 02/01/16 0617  AST ALT ALKPHOS 50 63 44  BILITOT 0.6 0.4 0.6  PROT 5.7* 7.2 5.7*  ALBUMIN 2.7* 3.2* 2.6*    Recent Labs Lab 01/28/16 2112  LIPASE 19   No results for input(s): AMMONIA in the last 168 hours. CBC:  Recent Labs Lab 01/28/16 2112 01/29/16 0508 01/30/16 0642 01/31/16 0813 02/01/16 0617  WBC 9.6 10.9* 5.4 5.4 3.8*  NEUTROABS  --   --  3.7  --   --   HGB 12.7 11.5* 10.4* 11.7* 10.7*  HCT 39.6 36.4 33.9* 36.7 33.0*  MCV 91.2 91.5 92.9 91.5 90.2  PLT 371 299 251 322 307   Cardiac Enzymes:  Recent Labs Lab 01/25/16 1903 01/26/16 0312 01/30/16 0127 01/30/16 0642 01/30/16 1225  TROPONINI <0.03 <0.03 <0.03 <0.03 <0.03   BNP: BNP (last 3 results) No results for input(s):  BNP in the last 8760 hours.  ProBNP (last 3 results) No results for input(s): PROBNP in the last 8760 hours.  CBG: No results for input(s): GLUCAP in the last 168 hours.     Signed:  Alba Cory MD.  [facility] 02/01/2016, 1:32 PM

## 2016-02-07 ENCOUNTER — Telehealth: Payer: Self-pay | Admitting: Neurology

## 2016-02-07 NOTE — Telephone Encounter (Signed)
FYI:   Spoke with patient who requested to hold off on EEG and scheduling of MRI. She stated that she has been in hospital for 9 days and cant miss anymore time at work.

## 2016-02-14 ENCOUNTER — Other Ambulatory Visit: Payer: 59

## 2016-04-03 ENCOUNTER — Other Ambulatory Visit (HOSPITAL_COMMUNITY)
Admission: RE | Admit: 2016-04-03 | Discharge: 2016-04-03 | Disposition: A | Discharge status: Home / Self Care | Payer: 59 | Source: Ambulatory Visit | Attending: Obstetrics and Gynecology | Admitting: Obstetrics and Gynecology

## 2016-04-03 ENCOUNTER — Other Ambulatory Visit: Payer: Self-pay | Admitting: Obstetrics and Gynecology

## 2016-04-03 DIAGNOSIS — Z1151 Encounter for screening for human papillomavirus (HPV): Secondary | ICD-10-CM | POA: Insufficient documentation

## 2016-04-03 DIAGNOSIS — Z01419 Encounter for gynecological examination (general) (routine) without abnormal findings: Secondary | ICD-10-CM | POA: Diagnosis present

## 2016-04-05 LAB — CYTOLOGY - PAP

## 2016-04-08 ENCOUNTER — Ambulatory Visit (INDEPENDENT_AMBULATORY_CARE_PROVIDER_SITE_OTHER): Payer: 59 | Admitting: Neurology

## 2016-04-08 DIAGNOSIS — G4761 Periodic limb movement disorder: Secondary | ICD-10-CM

## 2016-04-13 ENCOUNTER — Telehealth: Payer: Self-pay | Admitting: Neurology

## 2016-04-13 NOTE — Telephone Encounter (Signed)
Patient referred by Dr. Lucia GaskinsAhern, seen by me on 01/19/16, diagnostic PSG on 04/08/16.   Please call and notify the patient that the recent sleep study did not show any significant obstructive sleep apnea, she did have leg twitching at night. Please inform patient that I would like to go over the details of the study during a follow up appointment. Arrange a followup appointment. Also, route or fax report to PCP and referring MD, if other than PCP.  Once you have spoken to patient, you can close this encounter.   Thanks,  Huston FoleySaima Johncarlo Maalouf, MD, PhD [facility])

## 2016-04-16 ENCOUNTER — Other Ambulatory Visit: Payer: Self-pay

## 2016-04-16 DIAGNOSIS — G2581 Restless legs syndrome: Secondary | ICD-10-CM

## 2016-04-16 DIAGNOSIS — G4710 Hypersomnia, unspecified: Secondary | ICD-10-CM

## 2016-04-16 DIAGNOSIS — R413 Other amnesia: Secondary | ICD-10-CM

## 2016-04-16 DIAGNOSIS — R351 Nocturia: Secondary | ICD-10-CM

## 2016-04-16 DIAGNOSIS — R519 Headache, unspecified: Secondary | ICD-10-CM

## 2016-04-16 DIAGNOSIS — R51 Headache: Secondary | ICD-10-CM

## 2016-04-16 DIAGNOSIS — R0683 Snoring: Secondary | ICD-10-CM

## 2016-04-19 NOTE — Telephone Encounter (Signed)
I spoke to patient. She is aware of results and recommendations. We made appt for patient at the end of the month. I will send copy of report to PCP.

## 2016-05-09 ENCOUNTER — Ambulatory Visit (INDEPENDENT_AMBULATORY_CARE_PROVIDER_SITE_OTHER): Payer: 59 | Admitting: Neurology

## 2016-05-09 ENCOUNTER — Encounter: Payer: Self-pay | Admitting: Neurology

## 2016-05-09 VITALS — BP 124/76 | HR 68 | Resp 16 | Ht 67.0 in | Wt 149.0 lb

## 2016-05-09 DIAGNOSIS — G479 Sleep disorder, unspecified: Secondary | ICD-10-CM

## 2016-05-09 NOTE — Patient Instructions (Signed)
Your sleep study results were benign. You have no sleep apnea.  You had leg movements/twitching in your sleep, but not disruptive to sleep.  Please remember to try to maintain good sleep hygiene, which means: Keep a regular sleep and wake schedule, try not to exercise or have a meal within 2 hours of your bedtime, try to keep your bedroom conducive for sleep, that is, cool and dark, without light distractors such as an illuminated alarm clock, and refrain from watching TV right before sleep or in the middle of the night and do not keep the TV or radio on during the night. Also, try not to use or play on electronic devices at bedtime, such as your cell phone, tablet PC or laptop. If you like to read at bedtime on an electronic device, try to dim the background light as much as possible. Do not eat in the middle of the night.   I can see you back as needed.   You will still need to schedule your MRI brain and EEG, then follow up with Dr. Lucia GaskinsAhern.

## 2016-05-09 NOTE — Progress Notes (Signed)
Subjective:    Patient ID: Tara [name] is a 55 y.o. female.  HPI     Interim history:  Tara [name] is a 55 year old right-handed woman with an underlying medical history of ADD, diverticulitis, psoriasis, diverticulitis, asthma, depression and overweight state, who presents for follow-up consultation after her recent sleep study. The patient is unaccompanied today. I first met her on 01/19/2016 at the request of Dr. Jaynee Eagles, at which time the patient reported snoring and excessive daytime somnolence, morning headaches as well as memory issues. I invited her for a sleep study. She had a baseline sleep study on 04/08/2016. I went over her test results with her in detail today. Sleep latency was normal at 17.5 minutes, sleep efficiency was high at 97.3%, wake after sleep onset was 9.5 minutes with minimal sleep fragmentation noted. She had a normal percentage of light stage sleep stage I, a mildly reduced percentage of stage II sleep, an increased percentage of slow-wave sleep and REM sleep was mildly increased at 27.3% with a normal REM latency. She had a normal AHI at 1 per hour, slept 25% in the supine position, minimal snoring was noted. Average oxygen saturation was 99%, nadir was 95%. She had an elevated PLM index at 40.4 per hour, without significant associated arousals.   Today, 05/09/2016: She reports no new issues, has not had her EEG or MRI brain as ordered by Dr. Jaynee Eagles as she was in the hospital recently. Of note, she had 2 hospitalizations in July for her diverticulitis. She has improved but recently restarted oral antibiotics. She also feels that she may have an upper respiratory infection. As far as her sleep study, she felt that she slept rather well that night. She does admit that at home she sleeps with her dog in her bed her bedroom and often that disturbs her sleep. Generally speaking, since her divorce she has been sleeping a little better because she used to worry about her husband's  sleep apnea and his loud snoring bothered her. She has had infrequent restless leg symptoms, they're not bothersome at this time.  Previously:   01/19/2016: She reports snoring, morning headaches and excessive daytime somnolence. I reviewed your office note from 01/18/2016. You have seen her for memory loss. Workup is underway including MRI brain, labs and EEG. She reports having had a sleep study about 10 years ago which per her report and recollection was normal. Prior sleep test results are not available for my review today. Her Epworth sleepiness score is 15 out of 24 today, her fatigue score is 35 out of 63. She is separated. She lives alone and has 1 dog. She has 4 grown children. She is a never smoker, drinks alcohol rarely and caffeine rarely. She has had more depression, managed by PCP, but still has residual Sx; denies SI currently.  She had gained weight on Cymbalta and was started on Wellbutrin about 2 weeks ago per her primary care physician. She also has a Social worker. 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 She goes to bed between 9:30 and 10 and falls asleep fairly quickly. She does not watch TV in bed. Rise time is around 5:45 AM. She has nocturia about once to twice per night on average. She has frequent morning headaches about 3-4 times a week. Denies migrainous headaches, these are dull achy headaches which improve as the day progresses   Of note, her father had obstructive sleep apnea and used a CPAP machine. She has a  brother and a sister with obstructive sleep apnea, both using CPAP machine. She has had infrequent restless leg symptoms which bother her at the time. She has had difficulty initiating and maintaining sleep when she has restless leg symptoms. She is not sure if she twitches or kicks her leg and her sleep. Her dog sleeps at the foot and of the bed and does not bother her sleep. Of note, she presented to the emergency room on 01/17/2016 with flare  up of diverticulitis and was placed on Flagyl. She also noticed a recent flareup in her psoriatic lesions.  Her Past Medical History Is Significant For: Past Medical History:  Diagnosis Date  . ADD (attention deficit disorder)   . Asthma   . Depression   . Diverticulitis   . Psoriasis     Her Past Surgical History Is Significant For: Past Surgical History:  Procedure Laterality Date  . APPENDECTOMY    . KNEE SURGERY Right     Her Family History Is Significant For: Family History  Problem Relation Age of Onset  . COPD Mother   . Hypertension Mother   . Heart failure Father   . Hypertension Father   . Hypertension Sister   . Heart attack Brother   . Hypertension Brother   . Diabetes Brother   . Diabetes Maternal Grandmother   . Dementia Neg Hx     Her Social History Is Significant For: Social History   Social History  . Marital status: Legally Separated    Spouse name: N/A  . Number of children: 4  . Years of education: college   Occupational History  . [employer]     Social History Main Topics  . Smoking status: Never Smoker  . Smokeless tobacco: None  . Alcohol use 0.0 oz/week  . Drug use: No  . Sexual activity: Not Asked   Other Topics Concern  . None   Social History Narrative   Rare caffeine use     Her Allergies Are:  Allergies  Allergen Reactions  . Ciprofloxacin Hcl Rash  . Codeine Itching    HYDROCODONE AND OXYCODONE ALSO  :   Her Current Medications Are:  Outpatient Encounter Prescriptions as of 05/09/2016  Medication Sig  . buPROPion (WELLBUTRIN XL) 150 MG 24 hr tablet Take 150 mg by mouth every morning.   . mometasone (NASONEX) 50 MCG/ACT nasal spray   . mometasone (NASONEX) 50 MCG/ACT nasal spray   . mometasone-formoterol (DULERA) 100-5 MCG/ACT AERO   . polyethylene glycol (MIRALAX) packet Take 17 g by mouth daily as needed for mild constipation. Titrate for soft stools.  . [DISCONTINUED] acidophilus (RISAQUAD) CAPS capsule Take 1  capsule by mouth daily.  . [DISCONTINUED] diphenhydrAMINE (BENADRYL) 25 mg capsule Take 1 capsule (25 mg total) by mouth every 6 (six) hours as needed for itching or allergies.  . [DISCONTINUED] naproxen sodium (ANAPROX) 220 MG tablet Take 220 mg by mouth 2 (two) times daily as needed (FOR PAIN).  . [DISCONTINUED] nystatin (MYCOSTATIN) 100000 UNIT/ML suspension Take 5 mLs (500,000 Units total) by mouth 4 (four) times daily.  . [DISCONTINUED] prochlorperazine (COMPAZINE) 10 MG tablet Take 1 tablet (10 mg total) by mouth every 6 (six) hours as needed for nausea or vomiting.  . [DISCONTINUED] traMADol (ULTRAM) 50 MG tablet Take 1 tablet (50 mg total) by mouth every 6 (six) hours as needed.   No facility-administered encounter medications on file as of 05/09/2016.   :  Review of Systems:  Out of a  complete 14 point review of systems, all are reviewed and negative with the exception of these symptoms as listed below: Review of Systems  Neurological:       Patient is here to discuss sleep study. No new concerns.     Objective:  Neurologic Exam  Physical Exam Physical Examination:   Vitals:   05/09/16 1605  BP: 124/76  Pulse: 68  Resp: 16   General Examination: The patient is a very pleasant 55 y.o. female in no acute distress. She appears well-developed and well-nourished and well groomed. Good spirits today.  HEENT: Normocephalic, atraumatic, pupils are equal, round and reactive to light and accommodation. Extraocular tracking is good without limitation to gaze excursion or nystagmus noted. Normal smooth pursuit is noted. Hearing is grossly intact. Face is symmetric with normal facial animation and normal facial sensation. Speech is clear with no dysarthria noted. There is no hypophonia. There is no lip, neck/head, jaw or voice tremor. Neck is supple with full range of passive and active motion. There are no carotid bruits on auscultation. Oropharynx exam reveals: mild mouth dryness, good  dental hygiene and mild airway crowding, due to smaller airway entry and tonsils. Mallampati is class II. Tongue protrudes centrally and palate elevates symmetrically. Tonsils are 1+ in size. She has a Mild overbite. Nasal inspection reveals no significant nasal mucosal bogginess or redness and no septal deviation, has a smaller nasal anatomy.   Chest: Clear to auscultation without wheezing, rhonchi or crackles noted.  Heart: S1+S2+0, regular and normal without murmurs, rubs or gallops noted.   Abdomen: Soft, non-tender and non-distended with normal bowel sounds appreciated on auscultation.  Extremities: There is no pitting edema in the distal lower extremities bilaterally. Pedal pulses are intact.  Skin: Warm and dry without trophic changes noted. There are no varicose veins. She has patchy psoriatic lesions all across her forearms and distal legs.  Musculoskeletal: exam reveals no obvious joint deformities, tenderness or joint swelling or erythema.   Neurologically:  Mental status: The patient is awake, alert and oriented in all 4 spheres. Her immediate and remote memory, attention, language skills and fund of knowledge are appropriate. There is no evidence of aphasia, agnosia, apraxia or anomia. Speech is clear with normal prosody and enunciation. Thought process is linear. Mood is normal and affect is normal.  Cranial nerves II - XII are as described above under HEENT exam. In addition: shoulder shrug is normal with equal shoulder height noted. Motor exam: Normal bulk, strength and tone is noted. There is no drift, tremor or rebound. Romberg is negative. Reflexes are 2 to 3+ throughout. Fine motor skills and coordination: intact with normal finger taps, normal hand movements, normal rapid alternating patting, normal foot taps and normal foot agility.  Cerebellar testing: No dysmetria or intention tremor on finger to nose testing. Heel to shin is unremarkable bilaterally. There is no truncal or  gait ataxia.  Sensory exam: intact to light touch in the upper and lower extremities.  Gait, station and balance: She stands easily. No veering to one side is noted. No leaning to one side is noted. Posture is age-appropriate and stance is narrow based. Gait shows normal stride length and normal pace. No problems turning are noted. Tandem walk is unremarkable.    Assessment and Plan:   In summary, Traniece Boffa is a very pleasant 55 year old female with an underlying medical history of ADD, diverticulitis, psoriasis, diverticulitis, asthma, depression and overweight state, who Presents for follow-up consultation after her recent  sleep study. We talked about her study results at length today. She slept rather well, sleep efficiency was 97%, sleep latency normal, she achieved quite a bit of deep sleep in dream sleep, very little sleep disruption,. She had no significant sleep disordered breathing, minimal snoring, and oxygen saturations were in the 90s throughout the night, this is very reassuring. She had moderate PLMS without significant arousals, thus not disturbing her sleep architecture, and at this time she does not have any significant RLS symptoms. Should she develop RLS symptoms or be bothered by leg jerking or twitching at night, I advised her that there are treatments available, she can discuss this with PCP at the time or with Dr. Jaynee Eagles. Furthermore, she is advised to go ahead and schedule her testing as advised by Dr. Jaynee Eagles. She is agreeable. From the sleep standpoint, I suggested she maintain good sleep hygiene, try to avoid letting her dog sleep in her bedroom or bed, and she is encouraged to continue to maintain a healthy lifestyle. I can see her back on an as-needed basis. I answered all her questions today and she was in agreement. I spent 25 minutes in total face-to-face time with the patient, more than 50% of which was spent in counseling and coordination of care, reviewing test results,  reviewing medication and discussing or reviewing the diagnosis of sleep disturbance, the prognosis and treatment options.

## 2016-05-31 ENCOUNTER — Ambulatory Visit (INDEPENDENT_AMBULATORY_CARE_PROVIDER_SITE_OTHER): Payer: 59 | Admitting: Neurology

## 2016-05-31 DIAGNOSIS — R4182 Altered mental status, unspecified: Secondary | ICD-10-CM

## 2016-05-31 DIAGNOSIS — R413 Other amnesia: Secondary | ICD-10-CM

## 2016-05-31 DIAGNOSIS — R404 Transient alteration of awareness: Secondary | ICD-10-CM

## 2016-05-31 NOTE — Procedures (Signed)
     History: Tara [name] is a 55 year old patient with a history of memory problems and some depression. The patient is having increasing problems with focusing at work and difficulty concentrating. The patient has episodes of missing periods of time, increasing problems with driving. The patient is being evaluated for these episodes.  This is a routine EEG. No skull defects are noted. Medications include Wellbutrin, Dulera, multivitamins, and metronidazole.  EEG classification: Dysrhythmia grade 2 bitemporal  Description of the recording: The background rhythms of this recording consists of a well modulated medium amplitude alpha rhythm of 10 Hz that is reactive to eye opening and closure. As the record progresses, the patient appears to be in the waking state. Photic stimulation is performed, and this results in a bilateral and symmetric photic driving response. Hyperventilation is then performed and results in a slight buildup of the background rhythm activities without significant slowing seen. As the record continues, there appears to be brief bursts of bitemporal slowing in the 5-6 Hz range, occasionally sharp and slow-wave complexes are seen. At times, the slowing is slightly more prominent on the left than the right. At no time during the recording does there appear to be actual spike or spike-wave discharges. EKG monitor shows no evidence of cardiac rhythm abnormalities with a heart rate of 54.  Impression: This is an abnormal EEG recording secondary to episodic bursts of slowing in the temporal regions bilaterally, occasionally with sharp and slow-wave complexes. Such a recording suggests a lowered seizure threshold, no definite electrographic seizures were recorded, however.

## 2016-06-05 ENCOUNTER — Telehealth: Payer: Self-pay

## 2016-06-05 NOTE — Telephone Encounter (Signed)
I spoke to pt. I advised her that the EEG was abnormal and that Dr. Lucia GaskinsAhern needs to see pt within the next 2 weeks. Dr. Lucia GaskinsAhern wants to start pt on Keppra and order an ambulatory EEG.  Pt is agreeable to a follow up appt. I made a tentative appt for pt on 06/11/16 at 12:00noon ( This is Dr. Trevor MaceAhern's work in spot, there is no other availability in the next 2 weeks.) Pt does not wish to start keppra at this time; she wants to discuss keppra in the follow up visit with Dr. Lucia GaskinsAhern.  Pt wants me to verify with Dr. Lucia GaskinsAhern that the work-in spot on 06/11/2016 at 12:00noon is an acceptable appt spot for her and then I will call her back to confirm the appt.  Pt verbalized understanding of results.

## 2016-06-05 NOTE — Telephone Encounter (Signed)
-----   Message from Anson FretAntonia B Ahern, MD sent at 06/05/2016  8:21 AM EST ----- EEG is abnormal. I would like do several things: have her follow up with me in the office in the next 2 weeks, start her on medication Keppra 500mg  twice daily, and order a 3-day ambulatory eeg. Please send me a phone npote after you speak with her thank you.

## 2016-06-05 NOTE — Telephone Encounter (Signed)
That's fine. thanks

## 2016-06-06 NOTE — Telephone Encounter (Signed)
I spoke to pt and advised her that her appt on 11/27 at noon is acceptable with Dr. Lucia GaskinsAhern. Pt verbalized understanding of appt, date, and time.

## 2016-06-11 ENCOUNTER — Ambulatory Visit (INDEPENDENT_AMBULATORY_CARE_PROVIDER_SITE_OTHER): Payer: 59 | Admitting: Neurology

## 2016-06-11 VITALS — BP 142/69 | HR 63 | Ht 67.0 in | Wt 148.2 lb

## 2016-06-11 DIAGNOSIS — R404 Transient alteration of awareness: Secondary | ICD-10-CM

## 2016-06-11 DIAGNOSIS — F05 Delirium due to known physiological condition: Secondary | ICD-10-CM

## 2016-06-11 DIAGNOSIS — R9401 Abnormal electroencephalogram [EEG]: Secondary | ICD-10-CM

## 2016-06-11 DIAGNOSIS — R410 Disorientation, unspecified: Secondary | ICD-10-CM

## 2016-06-11 DIAGNOSIS — R413 Other amnesia: Secondary | ICD-10-CM

## 2016-06-11 NOTE — Progress Notes (Signed)
[id number]    Provider:  Dr Lucia Gaskins Referring Provider: Merri Brunette, MD Primary Care Physician:  Allean Found, MD   CC:  Memory problems  Interval History 06/13/2016: Patient returns due to abnormal EEG. EEG recording was abnormal secondary to episodic bursts of slowing in the temporal regions bilaterally, occasionally with sharp and slow-wave complexes. Such a recording suggests a lowered seizure threshold, no definite electrographic seizures were recorded, however. She has not had the MRI of the brain completed even though we ordered it months ago, encouraged that and will try to expedite. She is on Wellbutrin and given EEG findings she should titrate off of this but should discuss other treatments for her depression with Dr. Katrinka Blazing. Spoke to Dr. Katrinka Blazing, she will titrate off of the Wellbutrin and add another mood-stabilizing drug. Apparently patient tried committing suicide, she needs to be on another medication. After Wellbutrin is titrated off will repeat possibly a prolonged EEG and if still abnormal then Lamictal may be a good addition due to positive mood effects.   HPI:  Tara [name] is a 55 y.o. female here as a referral from Dr. Katrinka Blazing for memory problems. Patient was referred to [place] for new onset of memory changes, trouble remembering the past and also having trouble with focus. She has a past medical history major depression, arthropathy, insomnia, attention deficit disorder, anxiety, memory changes. She has depression and thinks maybe the medication is affecting her. She can't remember memories of her kids growing up, she gets lost going on and she misses turns, she stops at intersections and doesn;t know where she is. Ongoing for years, getting worse and she is "missing blocks". She misses periods of time, she doesn't remember getting to work. No Fhx of dementia or seizures. The driving issues are new within the last year. Her depression is not well controlled  and she is having a difficult time right now. She can't remember last names. She is trying to find a psychiatrist to help manage her medications. No personality changes, hallucinations, delusions.   Reviewed notes, labs and imaging from outside physicians, which showed: Labs include normal CBC from April 2016, normal CMP with creatinine of 0.96 also in April 2016, LDL 106.  Reviewed notes from primary care. Patient is having issues with her mood, she is getting lost driving, she can't focus at work. Patient stopped the duloxetine several weekends ago due to not feeling well and she feels like she is in a fog and then restarted on Monday. It makes her sleepy. She is eating food and spending money which she notices not good. Patient is going to a counselor and they recommended Wellbutrin. Patient had issues with taking Adderall previously. She states that she is concerned about having early Alzheimer's disease. She can't remember things and with interesting is that her short-term memory is good but his long-term memory that shuffling with. She's tried to find a psychiatrist, she is scared and having trouble at work. Physical exam was normal including general, lungs, heart. She was recently diagnosed with major depressive disorder, and memory changes.  Review of Systems: Patient complains of symptoms per HPI as well as the following symptoms: Fevers chills, weight gain, easy bruising, feeling cold, incontinence, diarrhea, constipation, rash, moles, incontinence, memory loss, depression, anxiety, disinterest in activities. Pertinent negatives per HPI. All others negative.    Social History   Social History  . Marital status: Legally Separated    Spouse name: N/A  . Number of children: 4  .  Years of education: college   Occupational History  . [employer]     Social History Main Topics  . Smoking status: Never Smoker  . Smokeless tobacco: Not on file  . Alcohol use 0.0 oz/week  . Drug use: No   . Sexual activity: Not on file   Other Topics Concern  . Not on file   Social History Narrative   Rare caffeine use     Family History  Problem Relation Age of Onset  . COPD Mother   . Hypertension Mother   . Heart failure Father   . Hypertension Father   . Hypertension Sister   . Heart attack Brother   . Hypertension Brother   . Diabetes Brother   . Diabetes Maternal Grandmother   . Dementia Neg Hx     Past Medical History:  Diagnosis Date  . ADD (attention deficit disorder)   . Asthma   . Depression   . Diverticulitis   . Psoriasis     Past Surgical History:  Procedure Laterality Date  . APPENDECTOMY    . KNEE SURGERY Right     Current Outpatient Prescriptions  Medication Sig Dispense Refill  . buPROPion (WELLBUTRIN XL) 150 MG 24 hr tablet Take 150 mg by mouth every morning.     . mometasone (NASONEX) 50 MCG/ACT nasal spray     . mometasone (NASONEX) 50 MCG/ACT nasal spray     . mometasone-formoterol (DULERA) 100-5 MCG/ACT AERO     . Montelukast Sodium (SINGULAIR PO) Take 1 tablet by mouth daily.    . Multiple Vitamins-Minerals (MULTIVITAMIN ADULT PO) Take 1 tablet by mouth daily.    . polyethylene glycol (MIRALAX) packet Take 17 g by mouth daily as needed for mild constipation. Titrate for soft stools. 100 each 0   No current facility-administered medications for this visit.     Allergies as of 06/11/2016 - Review Complete 06/11/2016  Allergen Reaction Noted  . Ciprofloxacin hcl Rash 02/01/2016  . Codeine Itching 09/27/2011    Vitals: BP (!) 142/69 (BP Location: Right Arm, Patient Position: Sitting, Cuff Size: Normal)   Pulse 63   Ht 5\' 7"  (1.702 m)   Wt 148 lb 3.2 oz (67.2 kg)   LMP 08/30/2010   BMI 23.21 kg/m  Last Weight:  Wt Readings from Last 1 Encounters:  06/11/16 148 lb 3.2 oz (67.2 kg)   Last Height:   Ht Readings from Last 1 Encounters:  06/11/16 5\' 7"  (1.702 m)    Cranial Nerves:    The pupils are equal, round, and reactive to  light. The fundi are normal and spontaneous venous pulsations are present. Visual fields are full to finger confrontation. Extraocular movements are intact. Trigeminal sensation is intact and the muscles of mastication are normal. The face is symmetric. The palate elevates in the midline. Hearing intact. Voice is normal. Shoulder shrug is normal. The tongue has normal motion without fasciculations.   Coordination:    Normal finger to nose and heel to shin. Normal rapid alternating movements.   Gait:    Heel-toe and tandem gait are normal.   Motor Observation:    No asymmetry, no atrophy, and no involuntary movements noted. Tone:    Normal muscle tone.    Posture:    Posture is normal. normal erect    Strength:    Strength is V/V in the upper and lower limbs.      Sensation: intact to LT     Reflex Exam:  DTR's:  Deep tendon reflexes in the upper and lower extremities are normal bilaterally.   Toes:    The toes are downgoing bilaterally.   Clonus:    Clonus is absent.       Assessment/Plan:   55 y.o. female here as a referral from Dr. Katrinka BlazingSmith for memory problems. Patient was referred to us for new onset of memory changes, trouble remembering the past and also having trouble with focus.  She has uncontrolled depression, anxiety and ADD and stressors in her life such as separation from husband. Memory changes very unlikely alzheimers or other neucognitive degenerative disease but still need to work up with MRi brain, eeg, labs. Her epworth sleepiness scale is 14/30, she snores, morning headaches, but sleep evaluation for OSA was normal.  - EEG was abnormal secondary to episodic bursts of slowing in the temporal regions bilaterally, occasionally with sharp and slow-wave complexes - She is on Wellbutrin, discussed with Dr. Katrinka BlazingSmith and she will titrate the Wellbutrin off and add a different mood-stabilizing medication.  - When she is off of the Wellbutrin I will repeat EEG  routine or possibly a 3-day ambulatory EEG. If we continue to see abnormalities and she is symptomatic then Lamictal would be a good AED to add due to positive mood effects. - Sleep apnea test was normal - f/u 8 weeks, sooner if needed  Patient is unable to drive, operate heavy machinery, perform activities at heights or participate in water activities until 6 months event free  Cc: Dr. Reine JustSmith  Edgard Debord, MD  [facility] [address] [place], [place] [id number]  Phone [phone] Fax [phone] Tara DeanAntonia Quaneisha Hanisch, MD  A total of 40  minutes was spent face-to-face with this patient. Over half this time was spent on counseling patient on the memory loss and confusion, abnormal eeg diagnosis and different diagnostic and therapeutic options available.

## 2016-06-11 NOTE — Patient Instructions (Signed)
Remember to drink plenty of fluid, eat healthy meals and do not skip any meals. Try to eat protein with a every meal and eat a healthy snack such as fruit or nuts in between meals. Try to keep a regular sleep-wake schedule and try to exercise daily, particularly in the form of walking, 20-30 minutes a day, if you can.   As far as your medications are concerned, I would like to suggest:   Will discuss with [name] about Wellbutrin and possibly lamictal  As far as diagnostic testing: MRI brain, prolonged EEG and neurocognitive testing  I would like to see you back in 8 weeks, sooner if we need to. Please call us with any interim questions, concerns, problems, updates or refill requests.   Our phone number is [phone]. We also have an after hours call service for urgent matters and there is a physician on-call for urgent questions. For any emergencies you know to call 911 or go to the nearest emergency room

## 2016-06-13 ENCOUNTER — Ambulatory Visit (INDEPENDENT_AMBULATORY_CARE_PROVIDER_SITE_OTHER): Payer: 59

## 2016-06-13 ENCOUNTER — Encounter: Payer: Self-pay | Admitting: Neurology

## 2016-06-13 DIAGNOSIS — R404 Transient alteration of awareness: Secondary | ICD-10-CM

## 2016-06-13 DIAGNOSIS — R413 Other amnesia: Secondary | ICD-10-CM | POA: Diagnosis not present

## 2016-06-14 MED ORDER — GADOPENTETATE DIMEGLUMINE 469.01 MG/ML IV SOLN: 13.0000 mL | Freq: Once | INTRAVENOUS | Status: AC | PRN

## 2016-06-18 ENCOUNTER — Telehealth: Payer: Self-pay | Admitting: *Deleted

## 2016-06-18 NOTE — Telephone Encounter (Signed)
-----   Message from Anson FretAntonia B Ahern, MD sent at 06/18/2016  7:42 AM EST ----- MRI of the brain normal.

## 2016-06-18 NOTE — Telephone Encounter (Signed)
Called and spoke to pt about normal MRI brain per AA,MD. Pt verbalized understanding.

## 2016-07-03 ENCOUNTER — Other Ambulatory Visit: Payer: Self-pay | Admitting: Family Medicine

## 2016-07-03 DIAGNOSIS — R921 Mammographic calcification found on diagnostic imaging of breast: Secondary | ICD-10-CM

## 2016-08-15 ENCOUNTER — Encounter: Payer: Self-pay | Admitting: Neurology

## 2016-08-15 ENCOUNTER — Ambulatory Visit (INDEPENDENT_AMBULATORY_CARE_PROVIDER_SITE_OTHER): Payer: BLUE CROSS/BLUE SHIELD | Admitting: Neurology

## 2016-08-15 VITALS — BP 115/60 | HR 50 | Ht 67.0 in | Wt 154.0 lb

## 2016-08-15 DIAGNOSIS — F900 Attention-deficit hyperactivity disorder, predominantly inattentive type: Secondary | ICD-10-CM

## 2016-08-15 DIAGNOSIS — R413 Other amnesia: Secondary | ICD-10-CM

## 2016-08-15 DIAGNOSIS — R410 Disorientation, unspecified: Secondary | ICD-10-CM

## 2016-08-15 NOTE — Progress Notes (Signed)
[name]    Provider:  Dr Tara [name] Referring Provider: [name], [name], MD Primary Care Physician:  Tara FoundSMITH,[name] THIELE, MD   CC: Memory problems  Interval history 08/15/2016: Patient returns for follow up of memory problems and concentration problems. EEG recording was abnormal secondary to episodic bursts of slowing in the temporal regions bilaterally, occasionally with sharp and slow-wave complexes. Such a recording suggests a lowered seizure threshold, no definite electrographic seizures were recorded, At the time of the eeg she was on a high dose of Wellbutrin and pcp has since takes her off of that due to the EEG. MRI of the brain was normal.  Currently she is not on any anti-depressants. She started a new job Dec 11 and she can;t remember the numbers of her new cell phone. Not getting lost. She still feels like there are blocks of time over the years that she has forgotten but in the setting of chronic depression. She has had significant depression for many years however, has been hospitalized in the past for depression. She has very good insight into her depression. Her memory issues are stable and she feels improved today, likes her new job. Discussed formal memory testing but unlikely this is a neurodegenerative disorder such as dementia. She is doing well at her job, she is successful. We did discuss that her EEG was abnormal and we will repeat that but I also do not think that she is having seizures. She has concentration difficulties and ADD is a possibility, she walks into a room and doesn't remember what she was doing and is highly distractable with concentration issues.  Interval History 06/13/2016: Patient returns due to abnormal EEG. EEG recording was abnormal secondary to episodic bursts of slowing in the temporal regions bilaterally, occasionally with sharp and slow-wave complexes. Such a recording suggests a lowered seizure threshold, no definite  electrographic seizures were recorded, however. She has not had the MRI of the brain completed even though we ordered it months ago, encouraged that and will try to expedite. She is on Wellbutrin and given EEG findings she should titrate off of this but should discuss other treatments for her depression with Tara [name]. Spoke to Tara [name], she will titrate off of the Wellbutrin and add another mood-stabilizing drug. Apparently patient tried committing suicide, she needs to be on another medication. After Wellbutrin is titrated off will repeat possibly a prolonged EEG and if still abnormal then Lamictal may be a good addition due to positive mood effects.   YNW:GNFAHPI:Tara Saundersis a 56 y.o.femalehere as a referral from Dr. Marney SettingSmithfor memory problems. Patient was referred to us for new onset of memory changes, trouble remembering the past and also having trouble with focus. She has a past medical history major depression, arthropathy, insomnia, attention deficit disorder, anxiety, memory changes. She has depression and thinks maybe the medication is affecting her. She can't remember memories of her kids growing up, she gets lost going on and she misses turns, she stops at intersections and doesn;t know where she is. Ongoing for years, getting worse and she is "missing blocks". She misses periods of time, she doesn't remember getting to work. No Fhx of dementia or seizures. The driving issues are new within the last year. Her depression is not well controlled and she is having a difficult time right now. She can't remember last names. She is trying to find a psychiatrist to help manage her medications. No personality changes, hallucinations, delusions.   Reviewed notes, labs and imaging from  outside physicians, which showed: Labs include normal CBC from April 2016, normal CMP with creatinine of 0.96 also in April 2016, LDL 106.  Reviewed notes from primary care. Patient is having issues with her mood, she is  getting lost driving, she can't focus at work. Patient stopped the duloxetine several weekends ago due to not feeling well and she feels like she is in a fog and then restarted on Monday. It makes her sleepy. She is eating food and spending money which she notices not good. Patient is going to a counselor and they recommended Wellbutrin. Patient had issues with taking Adderall previously. She states that she is concerned about having early Alzheimer's disease. She can't remember things and with interesting is that her short-term memory is good but his long-term memory that shuffling with. She's tried to find a psychiatrist, she is scared and having trouble at work. Physical exam was normal including general, lungs, heart. She was recently diagnosed with major depressive disorder, and memory changes.  Review of Systems: Patient complains of symptoms per HPI as well as the following symptoms: Fevers chills, weight gain, easy bruising, feeling cold, incontinence, diarrhea, constipation, rash, moles, incontinence, memory loss, depression, anxiety, disinterest in activities. Pertinent negatives per HPI. All others negative.     Social History   Social History  . Marital status: Legally Separated    Spouse name: N/A  . Number of children: 4  . Years of education: college   Occupational History  . [employer]     Social History Main Topics  . Smoking status: Never Smoker  . Smokeless tobacco: Never Used  . Alcohol use 0.0 oz/week  . Drug use: No  . Sexual activity: Not on file   Other Topics Concern  . Not on file   Social History Narrative   Rare caffeine use     Family History  Problem Relation Age of Onset  . COPD Mother   . Hypertension Mother   . Heart failure Father   . Hypertension Father   . Hypertension Sister   . Heart attack Brother   . Hypertension Brother   . Diabetes Brother   . Diabetes Maternal Grandmother   . Dementia Neg Hx     Past Medical History:    Diagnosis Date  . ADD (attention deficit disorder)   . Asthma   . Depression   . Diverticulitis   . Psoriasis     Past Surgical History:  Procedure Laterality Date  . APPENDECTOMY    . KNEE SURGERY Right     Current Outpatient Prescriptions  Medication Sig Dispense Refill  . mometasone (NASONEX) 50 MCG/ACT nasal spray     . mometasone-formoterol (DULERA) 100-5 MCG/ACT AERO     . Montelukast Sodium (SINGULAIR PO) Take 1 tablet by mouth daily.    . Multiple Vitamins-Minerals (MULTIVITAMIN ADULT PO) Take 1 tablet by mouth daily.    . polyethylene glycol (MIRALAX) packet Take 17 g by mouth daily as needed for mild constipation. Titrate for soft stools. 100 each 0   No current facility-administered medications for this visit.    Facility-Administered Medications Ordered in Other Visits  Medication Dose Route Frequency Provider Last Rate Last Dose  . gadopentetate dimeglumine (MAGNEVIST) injection 13 mL  13 mL Intravenous Once PRN Anson Fret, MD        Allergies as of 08/15/2016 - Review Complete 08/15/2016  Allergen Reaction Noted  . Ciprofloxacin hcl Rash 02/01/2016  . Codeine Itching 09/27/2011  Vitals: BP 115/60 (BP Location: Right Arm, Patient Position: Sitting, Cuff Size: Normal)   Pulse (!) 50   Ht 5\' 7"  (1.702 m)   Wt 154 lb (69.9 kg)   LMP 08/30/2010   BMI 24.12 kg/m  Last Weight:  Wt Readings from Last 1 Encounters:  08/15/16 154 lb (69.9 kg)   Last Height:   Ht Readings from Last 1 Encounters:  08/15/16 5\' 7"  (1.702 m)   MMSE - Mini Mental State Exam 01/18/2016  Orientation to time 5  Orientation to Place 4  Registration 3  Attention/ Calculation 4  Recall 2  Language- name 2 objects 2  Language- repeat 1  Language- follow 3 step command 3  Language- read & follow direction 1  Write a sentence 1  Copy design 1  Total score 27   Cranial Nerves: The pupils are equal, round, and reactive to light. The fundi are normal and spontaneous  venous pulsations are present. Visual fields are full to finger confrontation. Extraocular movements are intact. Trigeminal sensation is intact and the muscles of mastication are normal. The face is symmetric. The palate elevates in the midline. Hearing intact. Voice is normal. Shoulder shrug is normal. The tongue has normal motion without fasciculations.   Coordination: Normal finger to nose and heel to shin. Normal rapid alternating movements.   Gait: Heel-toe and tandem gait are normal.   Motor Observation: No asymmetry, no atrophy, and no involuntary movements noted. Tone: Normal muscle tone.   Posture: Posture is normal. normal erect  Strength: Strength is V/V in the upper and lower limbs.   Sensation: intact to LT  Reflex Exam:  DTR's: Deep tendon reflexes in the upper and lower extremities are normal bilaterally.  Toes: The toes are downgoing bilaterally.  Clonus: Clonus is absent.      Assessment/Plan:55 y.o. female here as a referral from Dr. Katrinka Blazing for memory and concentration problems. Patient was referred to [place] for new onset of memory changes, trouble remembering the past and also having trouble with focus. She has depression, anxiety and ADD and stressors in her life such as separation from husband. Memory changes very unlikely alzheimers or other neucognitive degenerative disease. Her epworth sleepiness scale is 14/30, she snores, morning headaches, but sleep evaluation for OSA was normal. Lab testing and MRI of the brain were normal.   Neuropsychiatric testing for ADHD: Memory problems, difficulty with concentration, difficulty finishing tasks and paying attention, She has concentration difficulties and ADD is a possibility, she is highly distractable with concentration issues. If testing confirms ADD can treat with Adderall.  - EEG was abnormal secondary to episodic bursts of slowing in the temporal regions  bilaterally, occasionally with sharp and slow-wave complexes. Stopped Wellbutrin since then so will repeat eeg. However I don;t think she is having seizures but If we continue to see abnormalities and she is symptomatic then Lamictal would be a good AED to add due to positive mood effects. - Sleep apnea test was normal - MRI brain was normal - f/u after neuropsych testing  Orders Placed This Encounter  Procedures  . Ambulatory referral to Neuropsychology    Referral Priority:   Routine    Referral Type:   Psychiatric    Referral Reason:   Specialty Services Required    Requested Specialty:   Psychology    Number of Visits Requested:   1  . EEG    Standing Status:   Future    Standing Expiration Date:   08/15/2017  Cc: Dr. Reine Just, MD  [facility] [address] [place], [place] [address]  Phone [phone] Fax [phone] Naomie Dean, MD  A total of 30  minutes was spent face-to-face with this patient. Over half this time was spent on counseling patient on the memory loss and confusion, abnormal eeg diagnosis and different diagnostic and therapeutic options available.

## 2016-08-15 NOTE — Patient Instructions (Signed)
Remember to drink plenty of fluid, eat healthy meals and do not skip any meals. Try to eat protein with a every meal and eat a healthy snack such as fruit or nuts in between meals. Try to keep a regular sleep-wake schedule and try to exercise daily, particularly in the form of walking, 20-30 minutes a day, if you can.   As far as diagnostic testing: Neurpsychiatrist testing, repeat EEG  I would like to see you back after neuropsych testing, sooner if we need to. Please call us with any interim questions, concerns, problems, updates or refill requests.   Our phone number is [phone]. We also have an after hours call service for urgent matters and there is a physician on-call for urgent questions. For any emergencies you know to call 911 or go to the nearest emergency room

## 2016-08-21 ENCOUNTER — Ambulatory Visit
Admission: RE | Admit: 2016-08-21 | Discharge: 2016-08-21 | Disposition: A | Discharge status: Home / Self Care | Payer: BLUE CROSS/BLUE SHIELD | Source: Ambulatory Visit | Attending: Family Medicine | Admitting: Family Medicine

## 2016-08-21 ENCOUNTER — Other Ambulatory Visit: Payer: Self-pay | Admitting: Family Medicine

## 2016-08-21 DIAGNOSIS — R921 Mammographic calcification found on diagnostic imaging of breast: Secondary | ICD-10-CM

## 2016-08-22 ENCOUNTER — Ambulatory Visit
Admission: RE | Admit: 2016-08-22 | Discharge: 2016-08-22 | Disposition: A | Discharge status: Home / Self Care | Payer: BLUE CROSS/BLUE SHIELD | Source: Ambulatory Visit | Attending: Family Medicine | Admitting: Family Medicine

## 2016-08-22 ENCOUNTER — Other Ambulatory Visit: Payer: Self-pay | Admitting: Family Medicine

## 2016-08-22 DIAGNOSIS — R921 Mammographic calcification found on diagnostic imaging of breast: Secondary | ICD-10-CM

## 2016-09-03 ENCOUNTER — Other Ambulatory Visit: Payer: BLUE CROSS/BLUE SHIELD

## 2016-10-01 ENCOUNTER — Encounter: Payer: Self-pay | Admitting: Psychology

## 2016-11-19 ENCOUNTER — Other Ambulatory Visit: Payer: Self-pay | Admitting: Family Medicine

## 2016-11-19 DIAGNOSIS — Z1231 Encounter for screening mammogram for malignant neoplasm of breast: Secondary | ICD-10-CM

## 2017-01-10 ENCOUNTER — Ambulatory Visit
Admission: RE | Admit: 2017-01-10 | Discharge: 2017-01-10 | Disposition: A | Discharge status: Home / Self Care | Payer: BLUE CROSS/BLUE SHIELD | Source: Ambulatory Visit | Attending: Family Medicine | Admitting: Family Medicine

## 2017-01-10 DIAGNOSIS — Z1231 Encounter for screening mammogram for malignant neoplasm of breast: Secondary | ICD-10-CM

## 2017-05-07 DIAGNOSIS — Z96651 Presence of right artificial knee joint: Secondary | ICD-10-CM

## 2017-05-07 DIAGNOSIS — M222X1 Patellofemoral disorders, right knee: Secondary | ICD-10-CM | POA: Insufficient documentation

## 2017-05-07 HISTORY — DX: Presence of right artificial knee joint: Z96.651

## 2017-08-19 ENCOUNTER — Other Ambulatory Visit (HOSPITAL_COMMUNITY): Payer: Self-pay | Admitting: Gastroenterology

## 2017-08-19 DIAGNOSIS — R933 Abnormal findings on diagnostic imaging of other parts of digestive tract: Secondary | ICD-10-CM

## 2017-08-19 DIAGNOSIS — R1013 Epigastric pain: Secondary | ICD-10-CM | POA: Diagnosis not present

## 2017-08-22 ENCOUNTER — Ambulatory Visit: Payer: BLUE CROSS/BLUE SHIELD

## 2017-08-22 DIAGNOSIS — R1013 Epigastric pain: Secondary | ICD-10-CM

## 2017-08-22 DIAGNOSIS — K8020 Calculus of gallbladder without cholecystitis without obstruction: Secondary | ICD-10-CM | POA: Diagnosis not present

## 2017-08-22 DIAGNOSIS — R933 Abnormal findings on diagnostic imaging of other parts of digestive tract: Secondary | ICD-10-CM

## 2017-08-22 DIAGNOSIS — D2239 Melanocytic nevi of other parts of face: Secondary | ICD-10-CM | POA: Diagnosis not present

## 2017-08-23 DIAGNOSIS — F331 Major depressive disorder, recurrent, moderate: Secondary | ICD-10-CM | POA: Diagnosis not present

## 2017-09-09 DIAGNOSIS — K5732 Diverticulitis of large intestine without perforation or abscess without bleeding: Secondary | ICD-10-CM | POA: Diagnosis not present

## 2017-09-09 DIAGNOSIS — K5730 Diverticulosis of large intestine without perforation or abscess without bleeding: Secondary | ICD-10-CM | POA: Diagnosis not present

## 2017-09-09 DIAGNOSIS — K8010 Calculus of gallbladder with chronic cholecystitis without obstruction: Secondary | ICD-10-CM | POA: Diagnosis not present

## 2017-09-17 ENCOUNTER — Ambulatory Visit: Payer: Self-pay | Admitting: Surgery

## 2017-09-17 NOTE — H&P (Signed)
Tara [name] Documented: 09/09/2017 9:13 AM Location: [facility] Patient #: [id number] DOB: 1961-01-09 Single / Language: [place] / Race: White Female   History of Present Illness Tara Sportsman MD; 09/09/2017 5:50 PM) The patient is a 57 year old female who presents with abdominal pain. Note for "Abdominal pain": ` ` ` Patient sent for surgical consultation at the request of Dr. Vida Rigger  Chief Complaint: Postprandial abdominal back pain. Gallstones.  The patient is a 57 year old female lives had intermittent abdominal pains. She recalls getting an ultrasound in 2010 that confirmed gallstones. However she adjusted her diet to a low-fat diet and denied really having much in the way of any attacks. Did not consider surgery. However she's had increasing episodes of abdominal pain. Often radiates to her back. She felt nauseated. No real emesis though. Saw gastroenterology. They suspected biliary colic. Ultrasound shows larger gallstone with numerous other gallstones. No other surprises. They recommended a trial of proton pump inhibitor as well. She notes that has not really helped. Worse attack was after having hamburger with some french fries. Greasy meal. She had an appendectomy at age 66 but no other abdominal surgeries.  She does have a history of sigmoid diverticulitis with 6 attacks in the past 5 years. Last attack requiring admission in July 2017. Intermittent left lower quadrant abdominal pains suspicious for recurrent attacks. Usually she tries to righted down with liquids. However she had more severe pain and just started back on Augmentin antibiotics last week. She doesn't feel like its under control yet. However it's not markedly worse. She normally moves her bowels about twice a day with help of MiraLAX twice a day. She is rather active. Walks her dog at least a mile a day. She is here today with her husband. She recalls getting a colonoscopy  done this past fall. She does not recall being told there are any new polyps or other major issues. That report is not available. The one by Dr. Madilyn Fireman at [facility] noted moderate to severe diverticulosis.  No personal nor family history of GI/colon cancer, inflammatory bowel disease, irritable bowel syndrome, allergy such as Celiac Sprue, dietary/dairy problems, colitis, ulcers nor gastritis. No recent sick contacts/gastroenteritis. No travel outside the country. No changes in diet. No dysphagia to solids or liquids. No significant heartburn or reflux. No hematochezia, hematemesis, coffee ground emesis. No evidence of prior gastric/peptic ulceration.  (Review of systems as stated in this history (HPI) or in the review of systems. Otherwise all other 12 point ROS are negative)   Past Surgical History (Tara [name], RMA; 09/09/2017 9:13 AM) Appendectomy  Breast Biopsy  Left. Knee Surgery  Right. Oral Surgery   Diagnostic Studies History (Tara [name], RMA; 09/09/2017 9:13 AM) Colonoscopy  1-5 years ago Mammogram  within last year Pap Smear  1-5 years ago  Allergies (Tara [name], RMA; 09/09/2017 9:15 AM) Ciprofloxacin HCl *Fluoroquinolones**  Codeine Sulfate *ANALGESICS - OPIOID*  OxyCODONE HCl *ANALGESICS - OPIOID*  Hydrocodone-Acetaminophen *ANALGESICS - OPIOID*  Allergies Reconciled   Medication History (Tara [name], RMA; 09/09/2017 9:15 AM) Elwin Sleight (100-5MCG/ACT Aerosol, Inhalation) Active. ValACYclovir HCl (500MG  Tablet, Oral) Active. Otezla (30MG  Tablet, Oral) Active. Augmentin (Oral) Specific strength unknown - Active. Symbicort (80-4.5MCG/ACT Aerosol, Inhalation) Active. Medications Reconciled  Social History (Tara [name], RMA; 09/09/2017 9:13 AM) Alcohol use  Occasional alcohol use. Caffeine use  Tea. No drug use  Tobacco use  Never smoker.  Family History (Tara [name], RMA; 09/09/2017 9:13 AM)  Cerebrovascular Accident   Father. Diabetes Mellitus  Brother. Heart Disease  Brother, Father. Heart disease in female family member before age 58  Hypertension  Brother, Father, Mother, Sister. Respiratory Condition  Mother.  Pregnancy / Birth History (Tara A. Manson PasseyBrown, RMA; 09/09/2017 9:13 AM) Age at menarche  9 years. Age of menopause  5751-55 Gravida  6 Length (months) of breastfeeding  3-6 Maternal age  58-20 Para  4  Other Problems (Tara A. Manson PasseyBrown, RMA; 09/09/2017 9:13 AM) Anxiety Disorder  Arthritis  Asthma  Back Pain  Chest pain  Cholelithiasis  Depression  Diverticulosis  Melanoma  Transfusion history     Review of Systems (Tara [name] RMA; 09/09/2017 9:13 AM) General Present- Fatigue. Not Present- Appetite Loss, Chills, Fever, Night Sweats, Weight Gain and Weight Loss. Skin Not Present- Change in Wart/Mole, Dryness, Hives, Jaundice, New Lesions, Non-Healing Wounds, Rash and Ulcer. HEENT Present- Seasonal Allergies. Not Present- Earache, Hearing Loss, Hoarseness, Nose Bleed, Oral Ulcers, Ringing in the Ears, Sinus Pain, Sore Throat, Visual Disturbances, Wears glasses/contact lenses and Yellow Eyes. Respiratory Not Present- Bloody sputum, Chronic Cough, Difficulty Breathing, Snoring and Wheezing. Breast Not Present- Breast Mass, Breast Pain, Nipple Discharge and Skin Changes. Cardiovascular Present- Chest Pain. Not Present- Difficulty Breathing Lying Down, Leg Cramps, Palpitations, Rapid Heart Rate, Shortness of Breath and Swelling of Extremities. Gastrointestinal Present- Abdominal Pain and Nausea. Not Present- Bloating, Bloody Stool, Change in Bowel Habits, Chronic diarrhea, Constipation, Difficulty Swallowing, Excessive gas, Gets full quickly at meals, Hemorrhoids, Indigestion, Rectal Pain and Vomiting. Female Genitourinary Not Present- Frequency, Nocturia, Painful Urination, Pelvic Pain and Urgency. Musculoskeletal Present- Back Pain, Joint Pain and Joint Stiffness. Not  Present- Muscle Pain, Muscle Weakness and Swelling of Extremities. Neurological Present- Headaches. Not Present- Decreased Memory, Fainting, Numbness, Seizures, Tingling, Tremor, Trouble walking and Weakness. Psychiatric Not Present- Anxiety, Bipolar, Change in Sleep Pattern, Depression, Fearful and Frequent crying. Endocrine Present- Cold Intolerance. Not Present- Excessive Hunger, Hair Changes, Heat Intolerance, Hot flashes and New Diabetes. Hematology Present- Easy Bruising. Not Present- Blood Thinners, Excessive bleeding, Gland problems, HIV and Persistent Infections.  Vitals (Tara [name] RMA; 09/09/2017 9:14 AM) 09/09/2017 9:13 AM Weight: 144.8 lb Height: 67in Body Surface Area: 1.76 m Body Mass Index: 22.68 kg/m  Temp.: 98.40F  Pulse: 85 (Regular)  BP: 120/78 (Sitting, Left Arm, Standard)       Physical Exam Tara [name](Andriea Hasegawa C. Rock Sobol MD; 09/09/2017 10:09 AM) General Mental Status-Alert. General Appearance-Not in acute distress, Not Sickly. Orientation-Oriented X3. Hydration-Well hydrated. Voice-Normal.  Integumentary Global Assessment Upon inspection and palpation of skin surfaces of the - Axillae: non-tender, no inflammation or ulceration, no drainage. and Distribution of scalp and body hair is normal. General Characteristics Temperature - normal warmth is noted.  Head and Neck Head-normocephalic, atraumatic with no lesions or palpable masses. Face Global Assessment - atraumatic, no absence of expression. Neck Global Assessment - no abnormal movements, no bruit auscultated on the right, no bruit auscultated on the left, no decreased range of motion, non-tender. Trachea-midline. Thyroid Gland Characteristics - non-tender.  Eye Eyeball - Left-Extraocular movements intact, No Nystagmus. Eyeball - Right-Extraocular movements intact, No Nystagmus. Cornea - Left-No Hazy. Cornea - Right-No Hazy. Sclera/Conjunctiva - Left-No scleral  icterus, No Discharge. Sclera/Conjunctiva - Right-No scleral icterus, No Discharge. Pupil - Left-Direct reaction to light normal. Pupil - Right-Direct reaction to light normal.  ENMT Ears Pinna - Left - no drainage observed, no generalized tenderness observed. Right - no drainage observed, no generalized tenderness observed. Nose and Sinuses External Inspection  of the Nose - no destructive lesion observed. Inspection of the nares - Left - quiet respiration. Right - quiet respiration. Mouth and Throat Lips - Upper Lip - no fissures observed, no pallor noted. Lower Lip - no fissures observed, no pallor noted. Nasopharynx - no discharge present. Oral Cavity/Oropharynx - Tongue - no dryness observed. Oral Mucosa - no cyanosis observed. Hypopharynx - no evidence of airway distress observed.  Chest and Lung Exam Inspection Movements - Normal and Symmetrical. Accessory muscles - No use of accessory muscles in breathing. Palpation Palpation of the chest reveals - Non-tender. Auscultation Breath sounds - Normal and Clear.  Cardiovascular Auscultation Rhythm - Regular. Murmurs & Other Heart Sounds - Auscultation of the heart reveals - No Murmurs and No Systolic Clicks.  Abdomen Inspection Inspection of the abdomen reveals - No Visible peristalsis and No Abnormal pulsations. Umbilicus - No Bleeding, No Urine drainage. Palpation/Percussion Palpation and Percussion of the abdomen reveal - Soft, Non Tender, No Rebound tenderness, No Rigidity (guarding) and No Cutaneous hyperesthesia. Note: Abdomen soft. Not severely distended. No distasis recti. No umbilical or other anterior abdominal wall hernias.  Mild soreness left lower quadrant without any guarding. Mild epigastric discomfort. No Murphy sign.   Female Genitourinary Sexual Maturity Tanner 5 - Adult hair pattern. Note: No vaginal bleeding nor discharge   Peripheral Vascular Upper Extremity Inspection - Left - No Cyanotic  nailbeds, Not Ischemic. Right - No Cyanotic nailbeds, Not Ischemic.  Neurologic Neurologic evaluation reveals -normal attention span and ability to concentrate, able to name objects and repeat phrases. Appropriate fund of knowledge , normal sensation and normal coordination. Mental Status Affect - not angry, not paranoid. Cranial Nerves-Normal Bilaterally. Gait-Normal.  Neuropsychiatric Mental status exam performed with findings of-able to articulate well with normal speech/language, rate, volume and coherence, thought content normal with ability to perform basic computations and apply abstract reasoning and no evidence of hallucinations, delusions, obsessions or homicidal/suicidal ideation. Note: Anxious but consolable   Musculoskeletal Global Assessment Spine, Ribs and Pelvis - no instability, subluxation or laxity. Right Upper Extremity - no instability, subluxation or laxity.  Lymphatic Head & Neck  General Head & Neck Lymphatics: Bilateral - Description - No Localized lymphadenopathy. Axillary  General Axillary Region: Bilateral - Description - No Localized lymphadenopathy. Femoral & Inguinal  Generalized Femoral & Inguinal Lymphatics: Left - Description - No Localized lymphadenopathy. Right - Description - No Localized lymphadenopathy.    Assessment & Plan Tara Sportsman MD; 09/09/2017 5:51 PM) CHRONIC CHOLECYSTITIS WITH CALCULUS (K80.10) Impression: Recurrent attacks of biliary colic with postprandial epigastric pain radiating to back and nausea. First attack in 2010. Recurrent attacks despite low fat high fiber diet. I think she would benefit from cholecystectomy. She is worried about risks of surgery. I tried to frame the issue of the risks of doing nothing versus emergent surgery versus an elective situation. I think she is coming to terms and is ready to consider cholecystectomy Current Plans You are being scheduled for surgery- Our schedulers will call  you.  You should hear from our office's scheduling department within 5 working days about the location, date, and time of surgery. We try to make accommodations for patient's preferences in scheduling surgery, but sometimes the OR schedule or the surgeon's schedule prevents [place] from making those accommodations.  If you have not heard from our office [phone]) in 5 working days, call the office and ask for your surgeon's nurse.  If you have other questions about your diagnosis, plan, or surgery,  call the office and ask for your surgeon's nurse.  Written instructions provided Pt Education - Pamphlet Given - Laparoscopic Gallbladder Surgery: discussed with patient and provided information. The anatomy & physiology of hepatobiliary & pancreatic function was discussed. The pathophysiology of gallbladder dysfunction was discussed. Natural history risks without surgery was discussed. I feel the risks of no intervention will lead to serious problems that outweigh the operative risks; therefore, I recommended cholecystectomy to remove the pathology. I explained laparoscopic techniques with possible need for an open approach. Probable cholangiogram to evaluate the bilary tract was explained as well.  Risks such as bleeding, infection, abscess, leak, injury to other organs, need for further treatment, heart attack, death, and other risks were discussed. I noted a good likelihood this will help address the problem. Possibility that this will not correct all abdominal symptoms was explained. Goals of post-operative recovery were discussed as well. We will work to minimize complications. An educational handout further explaining the pathology and treatment options was given as well. Questions were answered. The patient expresses understanding & wishes to proceed with surgery.  Pt Education - Laparoscopic Cholecystectomy: gallbladder DIVERTICULITIS OF LARGE INTESTINE WITHOUT PERFORATION OR  ABSCESS WITHOUT BLEEDING (K57.32) Impression: Flares of proximal sigmoid diverticulitis, evidence by CAT scan in 2017. Had 6 attacks in 5 years. Some intermittent pains not requiring antibiotics in the past 2 years.  Now more intense soreness and currently is on Augmentin for at least the seventh documented flare. This despite focusing on a low fat high fiber diet with daily MiraLAX and regular bowel function.  I'm concerned given her relative young age and expected life expectancy that she will have repeated issues and would benefit from elective resection as well. Could be a combinational case. She had been encouraged to consider this by her gastroenterologist 2 years ago and was worried since she had a brother-in-law die from complications from colon surgery.  Obtain colonoscopy from last year. I encouraged her to discuss with her gastroenterologist.  She is leaning towards considering surgery since she struggles with recurrent diverticulitis. She is scared to have any surgery but is more concerned about her repeated biliary colic and recurrent diverticulitis continuing and potentially worsening. She wishes to think about things. That is not unreasonable. Current Plans Pt Education - [facility] Diverticular Disease (AT) You are being scheduled for surgery- Our schedulers will call you.  You should hear from our office's scheduling department within 5 working days about the location, date, and time of surgery. We try to make accommodations for patient's preferences in scheduling surgery, but sometimes the OR schedule or the surgeon's schedule prevents [place] from making those accommodations.  If you have not heard from our office [phone]) in 5 working days, call the office and ask for your surgeon's nurse.  If you have other questions about your diagnosis, plan, or surgery, call the office and ask for your surgeon's nurse.  PREOP COLON - ENCOUNTER FOR PREOPERATIVE EXAMINATION FOR GENERAL SURGICAL  PROCEDURE (Q46.962) Current Plans Written instructions provided The anatomy & physiology of the digestive tract was discussed. The pathophysiology of the colon was discussed. Natural history risks without surgery was discussed. I feel the risks of no intervention will lead to serious problems that outweigh the operative risks; therefore, I recommended a partial colectomy to remove the pathology. Minimally invasive (Robotic/Laparoscopic) & open techniques were discussed.  Risks such as bleeding, infection, abscess, leak, reoperation, possible ostomy, hernia, heart attack, death, and other risks were discussed. I noted a good  likelihood this will help address the problem. Goals of post-operative recovery were discussed as well. Need for adequate nutrition, daily bowel regimen and healthy physical activity, to optimize recovery was noted as well. We will work to minimize complications. Educational materials were available as well. Questions were answered. The patient expresses understanding & wishes to proceed with surgery.  Pt Education - [facility] Colon Bowel Prep 2018 ERAS/Miralax/Antibiotics Started Neomycin Sulfate 500 MG Oral Tablet, 2 (two) Tablet SEE NOTE, #6, 09/09/2017, No Refill. Local Order: TAKE TWO TABLETS AT 2 PM, 3 PM, AND 10 PM THE DAY PRIOR TO SURGERY Started Flagyl 500 MG Oral Tablet, 2 (two) Tablet SEE NOTE, #6, 09/09/2017, No Refill. Local Order: Take at 2pm, 3pm, and 10pm the day prior to your colon operation Pt Education - Pamphlet Given - Laparoscopic Colorectal Surgery: discussed with patient and provided information. Pt Education - [facility] Colectomy post-op instructions: discussed with patient and provided information. DIVERTICULOSIS LARGE INTESTINE W/O PERFORATION OR ABSCESS W/O BLEEDING (K57.30)    Signed by Tara Sportsman, MD (09/09/2017 5:52 PM)  I have re-reviewed the the patient's records, history, medications, and allergies.  I have re-examined the patient.  I  again discussed intraoperative plans and goals of post-operative recovery.  The patient agrees to proceed.  Tara [name]  1960/12/28 [id number]  Patient Care Team: Tara Brunette, MD as PCP - General (Family Medicine)  Patient Active Problem List   Diagnosis Date Noted  . Left upper extremity swelling   . Diverticulitis 01/29/2016  . Diverticulitis large intestine 01/29/2016  . Chest pain 01/25/2016  . Pain in the chest   . Diverticulitis of large intestine without perforation or abscess without bleeding 01/24/2016  . Abnormal urinalysis 01/24/2016  . Asthma in adult 01/24/2016  . Acute cystitis without hematuria   . Gastroesophageal reflux disease without esophagitis   . Memory changes 01/18/2016  . Severe episode of recurrent major depressive disorder, without psychotic features (HCC)   . Insomnia   . Generalized anxiety disorder   . MDD (major depressive disorder), recurrent severe, without psychosis (HCC) 09/30/2015    Past Medical History:  Diagnosis Date  . ADD (attention deficit disorder)   . Asthma   . Depression   . Diverticulitis   . Psoriasis     Past Surgical History:  Procedure Laterality Date  . APPENDECTOMY    . KNEE SURGERY Right     Social History   Socioeconomic History  . Marital status: Legally Separated    Spouse name: Not on file  . Number of children: 4  . Years of education: college  . Highest education level: Not on file  Social Needs  . Financial resource strain: Not on file  . Food insecurity - worry: Not on file  . Food insecurity - inability: Not on file  . Transportation needs - medical: Not on file  . Transportation needs - non-medical: Not on file  Occupational History  . Occupation: Academic librarian   Tobacco Use  . Smoking status: Never Smoker  . Smokeless tobacco: Never Used  Substance and Sexual Activity  . Alcohol use: Yes    Alcohol/week: 0.0 oz  . Drug use: No  . Sexual activity: Not on file  Other Topics Concern  .  Not on file  Social History Narrative   Rare caffeine use     Family History  Problem Relation Age of Onset  . COPD Mother   . Hypertension Mother   . Heart failure Father   .  Hypertension Father   . Hypertension Sister   . Heart attack Brother   . Hypertension Brother   . Diabetes Brother   . Diabetes Maternal Grandmother   . Breast cancer Maternal Aunt   . Breast cancer Cousin   . Dementia Neg Hx      (Not in a hospital admission)  Current Outpatient Medications  Medication Sig Dispense Refill  . mometasone (NASONEX) 50 MCG/ACT nasal spray     . mometasone-formoterol (DULERA) 100-5 MCG/ACT AERO     . Montelukast Sodium (SINGULAIR PO) Take 1 tablet by mouth daily.    . Multiple Vitamins-Minerals (MULTIVITAMIN ADULT PO) Take 1 tablet by mouth daily.    . polyethylene glycol (MIRALAX) packet Take 17 g by mouth daily as needed for mild constipation. Titrate for soft stools. 100 each 0   No current facility-administered medications for this visit.    Facility-Administered Medications Ordered in Other Visits  Medication Dose Route Frequency Provider Last Rate Last Dose  . gadopentetate dimeglumine (MAGNEVIST) injection 13 mL  13 mL Intravenous Once PRN Anson Fret, MD         Allergies  Allergen Reactions  . Ciprofloxacin Hcl Rash  . Codeine Itching    HYDROCODONE AND OXYCODONE ALSO    LMP 08/30/2010   Labs: No results found for this or any previous visit (from the past 48 hour(s)).  Imaging / Studies: US Abdomen Complete  Result Date: 08/22/2017 CLINICAL DATA:  Intermittent epigastric pain for 3-4 months. History of diverticulitis. History of appendectomy. EXAM: ABDOMEN ULTRASOUND COMPLETE COMPARISON:  01/28/2016 CT of the abdomen and pelvis FINDINGS: Gallbladder: Gallbladder is distended. There are multiple echogenic masses within the gallbladder associated acoustic shadowing and consistent with stones. Largest measures approximately 1.7 centimeters. Otherwise  gallbladder wall is normal in thickness. There is no pericholecystic fluid or sonographic Murphy's sign. Common bile duct: Diameter: 2.4 millimeters Liver: No focal lesion identified. Within normal limits in parenchymal echogenicity. Portal vein is patent on color Doppler imaging with normal direction of blood flow towards the liver. IVC: No abnormality visualized. Pancreas: Visualized portion unremarkable. Spleen: Obscured by bowel gas. Right Kidney: Length: 9.7 centimeters. Echogenicity within normal limits. No mass or hydronephrosis visualized. Left Kidney: Length: 10.4 centimeters. Echogenicity within normal limits. No mass or hydronephrosis visualized. Abdominal aorta: No aneurysm visualized. Other findings: None. IMPRESSION: 1. Distended gallbladder containing numerous stones, largest measuring approximately 1.7 centimeters. 2. No other ultrasound evidence for acute cholecystitis. 3. No hydronephrosis. Electronically Signed   By: Norva Pavlov M.D.   On: 08/22/2017 12:44     .Tara [name], M.D., F.A.C.S. Gastrointestinal and Minimally Invasive Surgery [facility] [address], [address] [place], [place] [address] [phone] Main / Paging  09/17/2017 12:57 PM

## 2017-09-21 DIAGNOSIS — F331 Major depressive disorder, recurrent, moderate: Secondary | ICD-10-CM | POA: Diagnosis not present

## 2017-10-10 NOTE — Progress Notes (Signed)
01-31-16 (Epic) ECHO

## 2017-10-10 NOTE — Patient Instructions (Addendum)
Tara [name]  10/10/2017   Your procedure is scheduled on: 10-25-17   [facility]    Report to admitting at 9:45 AM   Call this number if you have problems the morning of surgery [phone]    Remember: DRINK 2 PRESURGERY ENSURE DRINKS THE NIGHT BEFORE SURGERY AT 1000 PM AND 1 PRESURGERY DRINK THE DAY OF THE PROCEDURE 3 HOURS PRIOR TO SCHEDULED SURGERY. NO SOLIDS AFTER MIDNIGHT THE DAY PRIOR TO THE SURGERY. NOTHING BY MOUTH EXCEPT CLEAR LIQUIDS UNTIL THREE HOURS PRIOR TO SCHEDULED SURGERY. PLEASE FINISH PRESURGERY ENSURE DRINK PER SURGEON ORDER 3 HOURS PRIOR TO SCHEDULED SURGERY TIME WHICH NEEDS TO BE COMPLETED AT 8:45 AM________.   Please consume a Clear Liquid Diet the Day of Prep   CLEAR LIQUID DIET   Foods Allowed                                                                     Foods Excluded  Coffee and tea, regular and decaf                             liquids that you cannot  Plain Jell-O in any flavor                                             see through such as: Fruit ices (not with fruit pulp)                                     milk, soups, orange juice  Iced Popsicles                                    All solid food Carbonated beverages, regular and diet                                    Cranberry, grape and apple juices Sports drinks like Gatorade Lightly seasoned clear broth or consume(fat free) Sugar, honey syrup  Sample Menu Breakfast                                Lunch                                     Supper Cranberry juice                    Beef broth                            Chicken broth Jell-O  Grape juice                           Apple juice Coffee or tea                        Jell-O                                      Popsicle                                                Coffee or tea                        Coffee or  tea  _____________________________________________________________________     Take these medicines the morning of surgery with A SIP OF WATER: None. You may bring and use your nasal spray.                                 You may not have any metal on your body including hair pins and              piercings  Do not wear jewelry, make-up, lotions, powders or perfumes, deodorant             Do not wear nail polish.  Do not shave  48 hours prior to surgery.               Do not bring valuables to the hospital. [facility] IS NOT             RESPONSIBLE   FOR VALUABLES.  Contacts, dentures or bridgework may not be worn into surgery.  Leave suitcase in the car. After surgery it may be brought to your room.                  Please read over the following fact sheets you were given: _____________________________________________________________________          [facility] - Preparing for Surgery Before surgery, you can play an important role.  Because skin is not sterile, your skin needs to be as free of germs as possible.  You can reduce the number of germs on your skin by washing with CHG (chlorahexidine gluconate) soap before surgery.  CHG is an antiseptic cleaner which kills germs and bonds with the skin to continue killing germs even after washing. Please DO NOT use if you have an allergy to CHG or antibacterial soaps.  If your skin becomes reddened/irritated stop using the CHG and inform your nurse when you arrive at Short Stay. Do not shave (including legs and underarms) for at least 48 hours prior to the first CHG shower.  You may shave your face/neck. Please follow these instructions carefully:  1.  Shower with CHG Soap the night before surgery and the  morning of Surgery.  2.  If you choose to wash your hair, wash your hair first as usual with your  normal  shampoo.  3.  After you shampoo, rinse your hair and body thoroughly to remove the  shampoo.  4.  Use  CHG as you would any other liquid soap.  You can apply chg directly  to the skin and wash                       Gently with a scrungie or clean washcloth.  5.  Apply the CHG Soap to your body ONLY FROM THE NECK DOWN.   Do not use on face/ open                           Wound or open sores. Avoid contact with eyes, ears mouth and genitals (private parts).                       Wash face,  Genitals (private parts) with your normal soap.             6.  Wash thoroughly, paying special attention to the area where your surgery  will be performed.  7.  Thoroughly rinse your body with warm water from the neck down.  8.  DO NOT shower/wash with your normal soap after using and rinsing off  the CHG Soap.                9.  Pat yourself dry with a clean towel.            10.  Wear clean pajamas.            11.  Place clean sheets on your bed the night of your first shower and do not  sleep with pets. Day of Surgery : Do not apply any lotions/deodorants the morning of surgery.  Please wear clean clothes to the hospital/surgery center.  FAILURE TO FOLLOW THESE INSTRUCTIONS MAY RESULT IN THE CANCELLATION OF YOUR SURGERY PATIENT SIGNATURE_________________________________  NURSE SIGNATURE__________________________________  ________________________________________________________________________  WHAT IS A BLOOD TRANSFUSION? Blood Transfusion Information  A transfusion is the replacement of blood or some of its parts. Blood is made up of multiple cells which provide different functions.  Red blood cells carry oxygen and are used for blood loss replacement.  White blood cells fight against infection.  Platelets control bleeding.  Plasma helps clot blood.  Other blood products are available for specialized needs, such as hemophilia or other clotting disorders. BEFORE THE TRANSFUSION  Who gives blood for transfusions?   Healthy volunteers who are fully evaluated to make sure their blood is safe.  This is blood bank blood. Transfusion therapy is the safest it has ever been in the practice of medicine. Before blood is taken from a donor, a complete history is taken to make sure that person has no history of diseases nor engages in risky social behavior (examples are intravenous drug use or sexual activity with multiple partners). The donor's travel history is screened to minimize risk of transmitting infections, such as malaria. The donated blood is tested for signs of infectious diseases, such as HIV and hepatitis. The blood is then tested to be sure it is compatible with you in order to minimize the chance of a transfusion reaction. If you or a relative donates blood, this is often done in anticipation of surgery and is not appropriate for emergency situations. It takes many days to process the donated blood. RISKS AND COMPLICATIONS Although transfusion therapy is very safe and saves many lives, the main dangers of transfusion include:   Getting an infectious disease.  Developing a transfusion reaction.  This is an allergic reaction to something in the blood you were given. Every precaution is taken to prevent this. The decision to have a blood transfusion has been considered carefully by your caregiver before blood is given. Blood is not given unless the benefits outweigh the risks. AFTER THE TRANSFUSION  Right after receiving a blood transfusion, you will usually feel much better and more energetic. This is especially true if your red blood cells have gotten low (anemic). The transfusion raises the level of the red blood cells which carry oxygen, and this usually causes an energy increase.  The nurse administering the transfusion will monitor you carefully for complications. HOME CARE INSTRUCTIONS  No special instructions are needed after a transfusion. You may find your energy is better. Speak with your caregiver about any limitations on activity for underlying diseases you may have. SEEK  MEDICAL CARE IF:   Your condition is not improving after your transfusion.  You develop redness or irritation at the intravenous (IV) site. SEEK IMMEDIATE MEDICAL CARE IF:  Any of the following symptoms occur over the next 12 hours:  Shaking chills.  You have a temperature by mouth above 102 F (38.9 C), not controlled by medicine.  Chest, back, or muscle pain.  People around you feel you are not acting correctly or are confused.  Shortness of breath or difficulty breathing.  Dizziness and fainting.  You get a rash or develop hives.  You have a decrease in urine output.  Your urine turns a dark color or changes to pink, red, or brown. Any of the following symptoms occur over the next 10 days:  You have a temperature by mouth above 102 F (38.9 C), not controlled by medicine.  Shortness of breath.  Weakness after normal activity.  The white part of the eye turns yellow (jaundice).  You have a decrease in the amount of urine or are urinating less often.  Your urine turns a dark color or changes to pink, red, or brown. Document Released: 06/29/2000 Document Revised: 09/24/2011 Document Reviewed: 02/16/2008 [facility] Patient Information 2014 [place], [place].  _______________________________________________________________________

## 2017-10-11 ENCOUNTER — Encounter (HOSPITAL_COMMUNITY): Payer: Self-pay

## 2017-10-11 ENCOUNTER — Other Ambulatory Visit: Payer: Self-pay

## 2017-10-11 ENCOUNTER — Encounter (HOSPITAL_COMMUNITY)
Admission: RE | Admit: 2017-10-11 | Discharge: 2017-10-11 | Disposition: A | Discharge status: Home / Self Care | Payer: BLUE CROSS/BLUE SHIELD | Source: Ambulatory Visit | Attending: Surgery | Admitting: Surgery

## 2017-10-11 DIAGNOSIS — K5732 Diverticulitis of large intestine without perforation or abscess without bleeding: Secondary | ICD-10-CM | POA: Diagnosis not present

## 2017-10-11 DIAGNOSIS — K819 Cholecystitis, unspecified: Secondary | ICD-10-CM | POA: Insufficient documentation

## 2017-10-11 DIAGNOSIS — Z01812 Encounter for preprocedural laboratory examination: Secondary | ICD-10-CM | POA: Diagnosis not present

## 2017-10-11 LAB — BASIC METABOLIC PANEL
ANION GAP: 8 (ref 5–15)
BUN: 19 mg/dL (ref 6–20)
CALCIUM: 9.7 mg/dL (ref 8.9–10.3)
CO2: 27 mmol/L (ref 22–32)
Chloride: 106 mmol/L (ref 101–111)
Creatinine, Ser: 0.88 mg/dL (ref 0.44–1.00)
Glucose, Bld: 92 mg/dL (ref 65–99)
POTASSIUM: 4.2 mmol/L (ref 3.5–5.1)
SODIUM: 141 mmol/L (ref 135–145)

## 2017-10-11 LAB — CBC
HEMATOCRIT: 41 % (ref 36.0–46.0)
HEMOGLOBIN: 13.3 g/dL (ref 12.0–15.0)
MCH: 31.8 pg (ref 26.0–34.0)
MCHC: 32.4 g/dL (ref 30.0–36.0)
MCV: 98.1 fL (ref 78.0–100.0)
Platelets: 267 10*3/uL (ref 150–400)
RBC: 4.18 MIL/uL (ref 3.87–5.11)
RDW: 12.9 % (ref 11.5–15.5)
WBC: 5.1 10*3/uL (ref 4.0–10.5)

## 2017-10-11 LAB — HEMOGLOBIN A1C
HEMOGLOBIN A1C: 4.9 % (ref 4.8–5.6)
MEAN PLASMA GLUCOSE: 93.93 mg/dL

## 2017-10-12 DIAGNOSIS — F331 Major depressive disorder, recurrent, moderate: Secondary | ICD-10-CM | POA: Diagnosis not present

## 2017-10-25 ENCOUNTER — Inpatient Hospital Stay (HOSPITAL_COMMUNITY): Payer: BLUE CROSS/BLUE SHIELD

## 2017-10-25 ENCOUNTER — Other Ambulatory Visit: Payer: Self-pay

## 2017-10-25 ENCOUNTER — Encounter (HOSPITAL_COMMUNITY): Payer: Self-pay

## 2017-10-25 ENCOUNTER — Inpatient Hospital Stay (HOSPITAL_COMMUNITY): Payer: BLUE CROSS/BLUE SHIELD | Admitting: Anesthesiology

## 2017-10-25 ENCOUNTER — Encounter (HOSPITAL_COMMUNITY)
Admission: RE | Disposition: A | Discharge status: Home / Self Care | Payer: Self-pay | Source: Ambulatory Visit | Attending: Surgery

## 2017-10-25 ENCOUNTER — Inpatient Hospital Stay (HOSPITAL_COMMUNITY)
Admission: RE | Admit: 2017-10-25 | Discharge: 2017-11-01 | DRG: 357 | Disposition: A | Discharge status: Home / Self Care | Payer: BLUE CROSS/BLUE SHIELD | Source: Ambulatory Visit | Attending: Surgery | Admitting: Surgery

## 2017-10-25 DIAGNOSIS — R509 Fever, unspecified: Secondary | ICD-10-CM

## 2017-10-25 DIAGNOSIS — Z79899 Other long term (current) drug therapy: Secondary | ICD-10-CM

## 2017-10-25 DIAGNOSIS — E876 Hypokalemia: Secondary | ICD-10-CM | POA: Diagnosis not present

## 2017-10-25 DIAGNOSIS — Z8249 Family history of ischemic heart disease and other diseases of the circulatory system: Secondary | ICD-10-CM

## 2017-10-25 DIAGNOSIS — Z85820 Personal history of malignant melanoma of skin: Secondary | ICD-10-CM | POA: Diagnosis not present

## 2017-10-25 DIAGNOSIS — D62 Acute posthemorrhagic anemia: Secondary | ICD-10-CM | POA: Diagnosis not present

## 2017-10-25 DIAGNOSIS — Z818 Family history of other mental and behavioral disorders: Secondary | ICD-10-CM

## 2017-10-25 DIAGNOSIS — F411 Generalized anxiety disorder: Secondary | ICD-10-CM | POA: Diagnosis present

## 2017-10-25 DIAGNOSIS — K5732 Diverticulitis of large intestine without perforation or abscess without bleeding: Principal | ICD-10-CM | POA: Diagnosis present

## 2017-10-25 DIAGNOSIS — K8010 Calculus of gallbladder with chronic cholecystitis without obstruction: Secondary | ICD-10-CM | POA: Diagnosis not present

## 2017-10-25 DIAGNOSIS — Z7951 Long term (current) use of inhaled steroids: Secondary | ICD-10-CM

## 2017-10-25 DIAGNOSIS — R109 Unspecified abdominal pain: Secondary | ICD-10-CM | POA: Diagnosis not present

## 2017-10-25 DIAGNOSIS — K219 Gastro-esophageal reflux disease without esophagitis: Secondary | ICD-10-CM | POA: Diagnosis present

## 2017-10-25 DIAGNOSIS — Z885 Allergy status to narcotic agent status: Secondary | ICD-10-CM | POA: Diagnosis not present

## 2017-10-25 DIAGNOSIS — K811 Chronic cholecystitis: Secondary | ICD-10-CM | POA: Diagnosis not present

## 2017-10-25 DIAGNOSIS — Z881 Allergy status to other antibiotic agents status: Secondary | ICD-10-CM | POA: Diagnosis not present

## 2017-10-25 DIAGNOSIS — G4700 Insomnia, unspecified: Secondary | ICD-10-CM | POA: Diagnosis present

## 2017-10-25 DIAGNOSIS — K812 Acute cholecystitis with chronic cholecystitis: Secondary | ICD-10-CM

## 2017-10-25 DIAGNOSIS — Z96651 Presence of right artificial knee joint: Secondary | ICD-10-CM | POA: Diagnosis not present

## 2017-10-25 DIAGNOSIS — R0603 Acute respiratory distress: Secondary | ICD-10-CM | POA: Diagnosis not present

## 2017-10-25 DIAGNOSIS — D649 Anemia, unspecified: Secondary | ICD-10-CM | POA: Diagnosis not present

## 2017-10-25 DIAGNOSIS — F419 Anxiety disorder, unspecified: Secondary | ICD-10-CM | POA: Diagnosis present

## 2017-10-25 DIAGNOSIS — J45909 Unspecified asthma, uncomplicated: Secondary | ICD-10-CM | POA: Diagnosis present

## 2017-10-25 DIAGNOSIS — K5730 Diverticulosis of large intestine without perforation or abscess without bleeding: Secondary | ICD-10-CM | POA: Diagnosis not present

## 2017-10-25 HISTORY — DX: Acute cystitis without hematuria: N30.00

## 2017-10-25 HISTORY — PX: ROBOTIC ASSISTED LAPAROSCOPIC CHOLECYSTECTOMY: SHX6521

## 2017-10-25 HISTORY — DX: Other specified soft tissue disorders: M79.89

## 2017-10-25 HISTORY — DX: Unspecified abnormal findings in urine: R82.90

## 2017-10-25 HISTORY — DX: Presence of right artificial knee joint: Z96.651

## 2017-10-25 HISTORY — DX: Chest pain, unspecified: R07.9

## 2017-10-25 HISTORY — DX: Other amnesia: R41.3

## 2017-10-25 HISTORY — DX: Major depressive disorder, recurrent severe without psychotic features: F33.2

## 2017-10-25 HISTORY — PX: PROCTOSCOPY: SHX2266

## 2017-10-25 HISTORY — PX: ROBOT ASSISTED LAPAROSCOPIC PARTIAL COLECTOMY: SHX6476

## 2017-10-25 HISTORY — PX: CHOLECYSTECTOMY: SHX55

## 2017-10-25 LAB — GLUCOSE, CAPILLARY: GLUCOSE-CAPILLARY: 185 mg/dL — AB (ref 65–99)

## 2017-10-25 LAB — ABO/RH: ABO/RH(D): O POS

## 2017-10-25 LAB — TYPE AND SCREEN
ABO/RH(D): O POS
Antibody Screen: NEGATIVE

## 2017-10-25 SURGERY — CHOLECYSTECTOMY, ROBOT-ASSISTED, LAPAROSCOPIC
Anesthesia: General | Site: Rectum

## 2017-10-25 MED ORDER — LIDOCAINE 2% (20 MG/ML) 5 ML SYRINGE
INTRAMUSCULAR | Status: DC | PRN
  Administered 2017-10-25: 1.5 mg/kg/h via INTRAVENOUS

## 2017-10-25 MED ORDER — PROPOFOL 10 MG/ML IV BOLUS
INTRAVENOUS | Status: DC | PRN
  Administered 2017-10-25: 150 mg via INTRAVENOUS

## 2017-10-25 MED ORDER — FENTANYL CITRATE (PF) 100 MCG/2ML IJ SOLN
INTRAMUSCULAR | Status: AC
  Filled 2017-10-25: qty 2

## 2017-10-25 MED ORDER — ENOXAPARIN SODIUM 40 MG/0.4ML ~~LOC~~ SOLN
40.0000 mg | Freq: Once | SUBCUTANEOUS | Status: AC
  Administered 2017-10-25: 40 mg via SUBCUTANEOUS
  Filled 2017-10-25: qty 0.4

## 2017-10-25 MED ORDER — METOCLOPRAMIDE HCL 10 MG PO TABS: 10.0000 mg | ORAL_TABLET | Freq: Four times a day (QID) | ORAL | Status: DC | PRN

## 2017-10-25 MED ORDER — SODIUM CHLORIDE 0.9 % IV BOLUS: 500.0000 mL | Freq: Once | INTRAVENOUS | Status: DC | PRN

## 2017-10-25 MED ORDER — GABAPENTIN 300 MG PO CAPS
300.0000 mg | ORAL_CAPSULE | Freq: Two times a day (BID) | ORAL | Status: DC
  Administered 2017-10-25 – 2017-10-30 (×11): 300 mg via ORAL
  Filled 2017-10-25 (×11): qty 1

## 2017-10-25 MED ORDER — SODIUM CHLORIDE 0.9 % IV SOLN
INTRAVENOUS | Status: DC
  Filled 2017-10-25: qty 6

## 2017-10-25 MED ORDER — CALCIUM CARBONATE-VITAMIN D 500-200 MG-UNIT PO TABS
1.0000 | ORAL_TABLET | Freq: Every day | ORAL | Status: DC
  Administered 2017-10-26 – 2017-11-01 (×7): 1 via ORAL
  Filled 2017-10-25 (×7): qty 1

## 2017-10-25 MED ORDER — BUPIVACAINE HCL (PF) 0.25 % IJ SOLN
INTRAMUSCULAR | Status: AC
  Filled 2017-10-25: qty 30

## 2017-10-25 MED ORDER — ROCURONIUM BROMIDE 10 MG/ML (PF) SYRINGE
PREFILLED_SYRINGE | INTRAVENOUS | Status: AC
  Filled 2017-10-25: qty 5

## 2017-10-25 MED ORDER — APREMILAST 30 MG PO TABS
1.0000 | ORAL_TABLET | Freq: Two times a day (BID) | ORAL | Status: DC
  Administered 2017-10-25 – 2017-10-31 (×13): 1 via ORAL

## 2017-10-25 MED ORDER — PROPOFOL 10 MG/ML IV BOLUS
INTRAVENOUS | Status: AC
  Filled 2017-10-25: qty 20

## 2017-10-25 MED ORDER — ROCURONIUM BROMIDE 10 MG/ML (PF) SYRINGE
PREFILLED_SYRINGE | INTRAVENOUS | Status: DC | PRN
  Administered 2017-10-25: 5 mg via INTRAVENOUS
  Administered 2017-10-25 (×2): 10 mg via INTRAVENOUS
  Administered 2017-10-25: 50 mg via INTRAVENOUS

## 2017-10-25 MED ORDER — TRAMADOL HCL 50 MG PO TABS
50.0000 mg | ORAL_TABLET | Freq: Four times a day (QID) | ORAL | Status: DC | PRN
  Administered 2017-10-26 – 2017-10-28 (×4): 50 mg via ORAL
  Filled 2017-10-25 (×4): qty 1

## 2017-10-25 MED ORDER — LACTATED RINGERS IV SOLN
INTRAVENOUS | Status: DC
  Administered 2017-10-25: 22:00:00 via INTRAVENOUS

## 2017-10-25 MED ORDER — SACCHAROMYCES BOULARDII 250 MG PO CAPS
250.0000 mg | ORAL_CAPSULE | Freq: Two times a day (BID) | ORAL | Status: DC
  Administered 2017-10-25 – 2017-11-01 (×14): 250 mg via ORAL
  Filled 2017-10-25 (×14): qty 1

## 2017-10-25 MED ORDER — PROCHLORPERAZINE MALEATE 10 MG PO TABS: 10.0000 mg | ORAL_TABLET | Freq: Four times a day (QID) | ORAL | Status: DC | PRN

## 2017-10-25 MED ORDER — MONTELUKAST SODIUM 10 MG PO TABS
10.0000 mg | ORAL_TABLET | Freq: Every day | ORAL | Status: DC
  Administered 2017-10-25 – 2017-10-31 (×7): 10 mg via ORAL
  Filled 2017-10-25 (×7): qty 1

## 2017-10-25 MED ORDER — FLUTICASONE PROPIONATE 50 MCG/ACT NA SUSP
2.0000 | Freq: Every day | NASAL | Status: DC
  Administered 2017-10-26 – 2017-10-30 (×5): 2 via NASAL
  Filled 2017-10-25: qty 16

## 2017-10-25 MED ORDER — METOPROLOL TARTRATE 5 MG/5ML IV SOLN: 5.0000 mg | Freq: Four times a day (QID) | INTRAVENOUS | Status: DC | PRN

## 2017-10-25 MED ORDER — ONDANSETRON HCL 4 MG PO TABS: 4.0000 mg | ORAL_TABLET | Freq: Four times a day (QID) | ORAL | Status: DC | PRN

## 2017-10-25 MED ORDER — BUPIVACAINE HCL 0.25 % IJ SOLN
INTRAMUSCULAR | Status: DC | PRN
  Administered 2017-10-25: 45 mL

## 2017-10-25 MED ORDER — PROCHLORPERAZINE EDISYLATE 5 MG/ML IJ SOLN: 5.0000 mg | Freq: Four times a day (QID) | INTRAMUSCULAR | Status: DC | PRN

## 2017-10-25 MED ORDER — DIPHENHYDRAMINE HCL 12.5 MG/5ML PO ELIX
12.5000 mg | ORAL_SOLUTION | Freq: Four times a day (QID) | ORAL | Status: DC | PRN
  Administered 2017-10-27 (×2): 12.5 mg via ORAL
  Filled 2017-10-25 (×2): qty 5

## 2017-10-25 MED ORDER — DEXAMETHASONE SODIUM PHOSPHATE 10 MG/ML IJ SOLN
INTRAMUSCULAR | Status: AC
  Filled 2017-10-25: qty 1

## 2017-10-25 MED ORDER — METRONIDAZOLE 500 MG PO TABS
1000.0000 mg | ORAL_TABLET | ORAL | Status: DC
  Filled 2017-10-25: qty 2

## 2017-10-25 MED ORDER — FENTANYL CITRATE (PF) 100 MCG/2ML IJ SOLN
INTRAMUSCULAR | Status: DC | PRN
  Administered 2017-10-25 (×2): 50 ug via INTRAVENOUS

## 2017-10-25 MED ORDER — ACETAMINOPHEN 500 MG PO TABS
1000.0000 mg | ORAL_TABLET | Freq: Four times a day (QID) | ORAL | Status: DC
  Administered 2017-10-26 – 2017-11-01 (×25): 1000 mg via ORAL
  Filled 2017-10-25 (×27): qty 2

## 2017-10-25 MED ORDER — LIP MEDEX EX OINT
1.0000 "application " | TOPICAL_OINTMENT | Freq: Two times a day (BID) | CUTANEOUS | Status: DC
  Administered 2017-10-26 – 2017-10-31 (×11): 1 via TOPICAL
  Filled 2017-10-25 (×3): qty 7

## 2017-10-25 MED ORDER — ONDANSETRON HCL 4 MG/2ML IJ SOLN: 4.0000 mg | Freq: Four times a day (QID) | INTRAMUSCULAR | Status: DC | PRN

## 2017-10-25 MED ORDER — MIDAZOLAM HCL 2 MG/2ML IJ SOLN
INTRAMUSCULAR | Status: AC
  Filled 2017-10-25: qty 2

## 2017-10-25 MED ORDER — SUGAMMADEX SODIUM 200 MG/2ML IV SOLN
INTRAVENOUS | Status: AC
  Filled 2017-10-25: qty 2

## 2017-10-25 MED ORDER — SUGAMMADEX SODIUM 200 MG/2ML IV SOLN
INTRAVENOUS | Status: DC | PRN
  Administered 2017-10-25: 150 mg via INTRAVENOUS

## 2017-10-25 MED ORDER — ENOXAPARIN SODIUM 40 MG/0.4ML ~~LOC~~ SOLN: 40.0000 mg | SUBCUTANEOUS | Status: DC

## 2017-10-25 MED ORDER — CELECOXIB 200 MG PO CAPS
200.0000 mg | ORAL_CAPSULE | ORAL | Status: AC
  Administered 2017-10-25: 200 mg via ORAL
  Filled 2017-10-25: qty 1

## 2017-10-25 MED ORDER — METRONIDAZOLE 0.75 % EX GEL: 1.0000 "application " | Freq: Every day | CUTANEOUS | Status: DC | PRN

## 2017-10-25 MED ORDER — CALCIUM CARBONATE-VITAMIN D 600-400 MG-UNIT PO TABS: 1.0000 | ORAL_TABLET | Freq: Every day | ORAL | Status: DC

## 2017-10-25 MED ORDER — GABAPENTIN 300 MG PO CAPS
300.0000 mg | ORAL_CAPSULE | ORAL | Status: AC
  Administered 2017-10-25: 300 mg via ORAL
  Filled 2017-10-25: qty 1

## 2017-10-25 MED ORDER — FENTANYL CITRATE (PF) 100 MCG/2ML IJ SOLN
INTRAMUSCULAR | Status: AC
  Filled 2017-10-25: qty 4

## 2017-10-25 MED ORDER — METOCLOPRAMIDE HCL 5 MG/ML IJ SOLN
10.0000 mg | Freq: Four times a day (QID) | INTRAMUSCULAR | Status: DC | PRN
  Administered 2017-10-25: 10 mg via INTRAVENOUS
  Filled 2017-10-25: qty 2

## 2017-10-25 MED ORDER — CEFOTETAN DISODIUM-DEXTROSE 2-2.08 GM-%(50ML) IV SOLR
2.0000 g | INTRAVENOUS | Status: AC
  Administered 2017-10-25: 2 g via INTRAVENOUS
  Filled 2017-10-25: qty 50

## 2017-10-25 MED ORDER — TRAMADOL HCL 50 MG PO TABS: 50.0000 mg | ORAL_TABLET | Freq: Four times a day (QID) | ORAL | 0 refills | Status: DC | PRN

## 2017-10-25 MED ORDER — FENTANYL CITRATE (PF) 100 MCG/2ML IJ SOLN
25.0000 ug | INTRAMUSCULAR | Status: DC | PRN
  Administered 2017-10-25: 50 ug via INTRAVENOUS

## 2017-10-25 MED ORDER — ALVIMOPAN 12 MG PO CAPS
12.0000 mg | ORAL_CAPSULE | ORAL | Status: AC
  Administered 2017-10-25: 12 mg via ORAL
  Filled 2017-10-25: qty 1

## 2017-10-25 MED ORDER — MIDAZOLAM HCL 2 MG/2ML IJ SOLN
INTRAMUSCULAR | Status: DC | PRN
  Administered 2017-10-25 (×2): 1 mg via INTRAVENOUS

## 2017-10-25 MED ORDER — DIPHENHYDRAMINE HCL 50 MG/ML IJ SOLN: 12.5000 mg | Freq: Four times a day (QID) | INTRAMUSCULAR | Status: DC | PRN

## 2017-10-25 MED ORDER — VALACYCLOVIR HCL 500 MG PO TABS
500.0000 mg | ORAL_TABLET | Freq: Every day | ORAL | Status: DC
  Administered 2017-10-25 – 2017-11-01 (×7): 500 mg via ORAL
  Filled 2017-10-25 (×9): qty 1

## 2017-10-25 MED ORDER — LACTATED RINGERS IR SOLN
Status: DC | PRN
  Administered 2017-10-25: 1000 mL

## 2017-10-25 MED ORDER — BUPIVACAINE LIPOSOME 1.3 % IJ SUSP
INTRAMUSCULAR | Status: DC | PRN
  Administered 2017-10-25: 20 mL

## 2017-10-25 MED ORDER — HYDROMORPHONE HCL 1 MG/ML IJ SOLN
0.5000 mg | INTRAMUSCULAR | Status: DC | PRN
  Administered 2017-10-25 (×2): 0.5 mg via INTRAVENOUS
  Administered 2017-10-26 (×3): 1 mg via INTRAVENOUS
  Administered 2017-10-26: 0.5 mg via INTRAVENOUS
  Administered 2017-10-26 – 2017-10-30 (×10): 1 mg via INTRAVENOUS
  Filled 2017-10-25 (×17): qty 1

## 2017-10-25 MED ORDER — ENSURE SURGERY PO LIQD
237.0000 mL | Freq: Two times a day (BID) | ORAL | Status: DC
  Administered 2017-10-26 – 2017-11-01 (×10): 237 mL via ORAL
  Filled 2017-10-25 (×14): qty 237

## 2017-10-25 MED ORDER — METHOCARBAMOL 500 MG PO TABS: 1000.0000 mg | ORAL_TABLET | Freq: Four times a day (QID) | ORAL | Status: DC | PRN

## 2017-10-25 MED ORDER — 0.9 % SODIUM CHLORIDE (POUR BTL) OPTIME
TOPICAL | Status: DC | PRN
  Administered 2017-10-25: 1000 mL

## 2017-10-25 MED ORDER — SODIUM CHLORIDE 0.9 % IV SOLN
2.0000 g | Freq: Two times a day (BID) | INTRAVENOUS | Status: AC
  Administered 2017-10-26: 2 g via INTRAVENOUS
  Filled 2017-10-25: qty 2

## 2017-10-25 MED ORDER — ALUM & MAG HYDROXIDE-SIMETH 200-200-20 MG/5ML PO SUSP
30.0000 mL | Freq: Four times a day (QID) | ORAL | Status: DC | PRN
  Administered 2017-10-27 (×2): 30 mL via ORAL
  Filled 2017-10-25 (×2): qty 30

## 2017-10-25 MED ORDER — LIDOCAINE 2% (20 MG/ML) 5 ML SYRINGE
INTRAMUSCULAR | Status: DC | PRN
  Administered 2017-10-25: 100 mg via INTRAVENOUS

## 2017-10-25 MED ORDER — METHOCARBAMOL 1000 MG/10ML IJ SOLN
1000.0000 mg | Freq: Four times a day (QID) | INTRAVENOUS | Status: DC | PRN
  Administered 2017-10-25: 1000 mg via INTRAVENOUS
  Filled 2017-10-25: qty 10

## 2017-10-25 MED ORDER — LIDOCAINE 2% (20 MG/ML) 5 ML SYRINGE
INTRAMUSCULAR | Status: AC
  Filled 2017-10-25: qty 5

## 2017-10-25 MED ORDER — LACTATED RINGERS IV SOLN
INTRAVENOUS | Status: DC
  Administered 2017-10-25: 16:00:00 via INTRAVENOUS
  Administered 2017-10-25: 1000 mL via INTRAVENOUS

## 2017-10-25 MED ORDER — BISACODYL 5 MG PO TBEC
20.0000 mg | DELAYED_RELEASE_TABLET | Freq: Once | ORAL | Status: DC
  Filled 2017-10-25: qty 4

## 2017-10-25 MED ORDER — MAGIC MOUTHWASH
15.0000 mL | Freq: Four times a day (QID) | ORAL | Status: DC | PRN
  Filled 2017-10-25: qty 15

## 2017-10-25 MED ORDER — ALVIMOPAN 12 MG PO CAPS
12.0000 mg | ORAL_CAPSULE | Freq: Two times a day (BID) | ORAL | Status: DC
  Administered 2017-10-26: 12 mg via ORAL
  Filled 2017-10-25: qty 1

## 2017-10-25 MED ORDER — ONDANSETRON HCL 4 MG/2ML IJ SOLN
INTRAMUSCULAR | Status: AC
  Filled 2017-10-25: qty 2

## 2017-10-25 MED ORDER — NEOMYCIN SULFATE 500 MG PO TABS
1000.0000 mg | ORAL_TABLET | ORAL | Status: DC
  Filled 2017-10-25: qty 2

## 2017-10-25 MED ORDER — ACETAMINOPHEN 500 MG PO TABS
1000.0000 mg | ORAL_TABLET | ORAL | Status: AC
  Administered 2017-10-25: 1000 mg via ORAL
  Filled 2017-10-25: qty 2

## 2017-10-25 MED ORDER — HYDRALAZINE HCL 20 MG/ML IJ SOLN: 10.0000 mg | INTRAMUSCULAR | Status: DC | PRN

## 2017-10-25 MED ORDER — DEXAMETHASONE SODIUM PHOSPHATE 10 MG/ML IJ SOLN
INTRAMUSCULAR | Status: DC | PRN
  Administered 2017-10-25: 10 mg via INTRAVENOUS

## 2017-10-25 MED ORDER — MOMETASONE FURO-FORMOTEROL FUM 100-5 MCG/ACT IN AERO
2.0000 | INHALATION_SPRAY | Freq: Two times a day (BID) | RESPIRATORY_TRACT | Status: DC
  Administered 2017-10-26 – 2017-11-01 (×13): 2 via RESPIRATORY_TRACT
  Filled 2017-10-25: qty 8.8

## 2017-10-25 MED ORDER — ONDANSETRON HCL 4 MG/2ML IJ SOLN
INTRAMUSCULAR | Status: DC | PRN
  Administered 2017-10-25: 4 mg via INTRAVENOUS

## 2017-10-25 MED ORDER — METOCLOPRAMIDE HCL 5 MG/ML IJ SOLN: 10.0000 mg | Freq: Four times a day (QID) | INTRAMUSCULAR | Status: DC | PRN

## 2017-10-25 MED ORDER — POLYETHYLENE GLYCOL 3350 17 GM/SCOOP PO POWD
1.0000 | Freq: Once | ORAL | Status: DC
  Filled 2017-10-25: qty 255

## 2017-10-25 MED ORDER — BUPIVACAINE LIPOSOME 1.3 % IJ SUSP
20.0000 mL | Freq: Once | INTRAMUSCULAR | Status: DC
  Filled 2017-10-25 (×3): qty 20

## 2017-10-25 SURGICAL SUPPLY — 58 items
APPLIER CLIP 5 13 M/L LIGAMAX5 (MISCELLANEOUS)
BLADE SURG SZ11 CARB STEEL (BLADE) ×3 IMPLANT
CANNULA REDUC XI 12-8 STAPL (CANNULA) ×1
CANNULA REDUCER 12-8 DVNC XI (CANNULA) ×2 IMPLANT
CHLORAPREP W/TINT 26ML (MISCELLANEOUS) ×3 IMPLANT
CLIP APPLIE 5 13 M/L LIGAMAX5 (MISCELLANEOUS) IMPLANT
CLIP VESOLOCK LG 6/CT PURPLE (CLIP) IMPLANT
CLIP VESOLOCK MED LG 6/CT (CLIP) ×3 IMPLANT
COVER SURGICAL LIGHT HANDLE (MISCELLANEOUS) ×6 IMPLANT
COVER TIP SHEARS 8 DVNC (MISCELLANEOUS) ×2 IMPLANT
COVER TIP SHEARS 8MM DA VINCI (MISCELLANEOUS) ×1
DECANTER SPIKE VIAL GLASS SM (MISCELLANEOUS) ×3 IMPLANT
DEVICE TROCAR PUNCTURE CLOSURE (ENDOMECHANICALS) IMPLANT
DRAPE ARM DVNC X/XI (DISPOSABLE) ×8 IMPLANT
DRAPE COLUMN DVNC XI (DISPOSABLE) ×2 IMPLANT
DRAPE DA VINCI XI ARM (DISPOSABLE) ×4
DRAPE DA VINCI XI COLUMN (DISPOSABLE) ×1
DRAPE SURG IRRIG POUCH 19X23 (DRAPES) ×3 IMPLANT
DRAPE UTILITY XL STRL (DRAPES) ×3 IMPLANT
DRSG OPSITE POSTOP 4X6 (GAUZE/BANDAGES/DRESSINGS) ×3 IMPLANT
DRSG TEGADERM 2-3/8X2-3/4 SM (GAUZE/BANDAGES/DRESSINGS) ×15 IMPLANT
DRSG TEGADERM 4X4.75 (GAUZE/BANDAGES/DRESSINGS) ×3 IMPLANT
ELECT REM PT RETURN 15FT ADLT (MISCELLANEOUS) ×3 IMPLANT
GAUZE SPONGE 2X2 8PLY STRL LF (GAUZE/BANDAGES/DRESSINGS) ×2 IMPLANT
GAUZE SPONGE 4X4 16PLY XRAY LF (GAUZE/BANDAGES/DRESSINGS) IMPLANT
GLOVE BIOGEL PI IND STRL 8 (GLOVE) ×8 IMPLANT
GLOVE BIOGEL PI INDICATOR 8 (GLOVE) ×4
GLOVE ECLIPSE 8.0 STRL XLNG CF (GLOVE) ×6 IMPLANT
GOWN STRL REUS W/TWL XL LVL3 (GOWN DISPOSABLE) ×18 IMPLANT
IRRIG SUCT STRYKERFLOW 2 WTIP (MISCELLANEOUS) ×3
IRRIGATION SUCT STRKRFLW 2 WTP (MISCELLANEOUS) ×2 IMPLANT
NEEDLE HYPO 22GX1.5 SAFETY (NEEDLE) ×3 IMPLANT
NEEDLE INSUFFLATION 14GA 120MM (NEEDLE) ×3 IMPLANT
PACK CARDIOVASCULAR III (CUSTOM PROCEDURE TRAY) ×3 IMPLANT
PAD POSITIONING PINK XL (MISCELLANEOUS) ×3 IMPLANT
PORT LAP GEL ALEXIS MED 5-9CM (MISCELLANEOUS) ×3 IMPLANT
POSITIONER SURGICAL ARM (MISCELLANEOUS) ×3 IMPLANT
POUCH SPECIMEN RETRIEVAL 10MM (ENDOMECHANICALS) IMPLANT
SCISSORS LAP 5X35 DISP (ENDOMECHANICALS) ×3 IMPLANT
SEAL CANN UNIV 5-8 DVNC XI (MISCELLANEOUS) ×8 IMPLANT
SEAL XI 5MM-8MM UNIVERSAL (MISCELLANEOUS) ×4
SEALER VESSEL DA VINCI XI (MISCELLANEOUS) ×1
SEALER VESSEL EXT DVNC XI (MISCELLANEOUS) ×2 IMPLANT
SOLUTION ELECTROLUBE (MISCELLANEOUS) ×3 IMPLANT
SPONGE GAUZE 2X2 STER 10/PKG (GAUZE/BANDAGES/DRESSINGS) ×1
STAPLER 45 GREEN RELOAD XI (STAPLE) ×2
STAPLER 45 GRN RELOAD XI (STAPLE) ×4 IMPLANT
STAPLER CANNULA SEAL DVNC XI (STAPLE) ×2 IMPLANT
STAPLER CANNULA SEAL XI (STAPLE) ×1
STAPLER CIRC ILS CVD 33MM 37CM (STAPLE) ×3 IMPLANT
STAPLER VISISTAT 35W (STAPLE) IMPLANT
SUT MNCRL AB 4-0 PS2 18 (SUTURE) ×6 IMPLANT
SUT PROLENE 2 0 KS (SUTURE) ×3 IMPLANT
SUT VICRYL 0 UR6 27IN ABS (SUTURE) ×3 IMPLANT
TOWEL OR 17X26 10 PK STRL BLUE (TOWEL DISPOSABLE) ×3 IMPLANT
TOWEL OR NON WOVEN STRL DISP B (DISPOSABLE) ×3 IMPLANT
TROCAR ADV FIXATION 5X100MM (TROCAR) ×3 IMPLANT
TUBING INSUFFLATION 10FT LAP (TUBING) ×3 IMPLANT

## 2017-10-25 NOTE — H&P (Signed)
Lawernce IonJill T Inocencio Documented: 09/09/2017 9:13 AM Location: [facility] Patient #: [id number] DOB: 08/21/1960 Single / Language: [place] / Race: White Female  Patient Care Team: [name], [name], MD as PCP - General (Family Medicine) Karie SodaGross, Claudia Greenley, MD as Consulting Physician (General Surgery) Huston FoleyAthar, Saima, MD as Attending Physician (Neurology) Dorena CookeyHayes, John, MD (Inactive) as Consulting Physician (Gastroenterology) Vida RiggerMagod, Marc, MD as Consulting Physician (Gastroenterology)   Chief Complaint: Postprandial abdominal back pain. Gallstones.  The patient is a 57 year old female lives had intermittent abdominal pains. She recalls getting an ultrasound in 2010 that confirmed gallstones. However she adjusted her diet to a low-fat diet and denied really having much in the way of any attacks. Did not consider surgery. However she's had increasing episodes of abdominal pain. Often radiates to her back. She felt nauseated. No real emesis though. Saw gastroenterology. They suspected biliary colic. Ultrasound shows larger gallstone with numerous other gallstones. No other surprises. They recommended a trial of proton pump inhibitor as well. She notes that has not really helped. Worse attack was after having hamburger with some french fries. Greasy meal. She had an appendectomy at age 57 but no other abdominal surgeries.  She does have a history of sigmoid diverticulitis with 6 attacks in the past 5 years. Last attack requiring admission in July 2017. Intermittent left lower quadrant abdominal pains suspicious for recurrent attacks. Usually she tries to righted down with liquids. However she had more severe pain and just started back on Augmentin antibiotics last week. She doesn't feel like its under control yet. However it's not markedly worse. She normally moves her bowels about twice a day with help of MiraLAX twice a day. She is rather active. Walks her dog at least a mile a day.  She is here today with her husband. She recalls getting a colonoscopy done this past fall. She does not recall being told there are any new polyps or other major issues. That report is not available. The one by [facility] noted moderate to severe diverticulosis.  No personal nor family history of GI/colon cancer, inflammatory bowel disease, irritable bowel syndrome, allergy such as Celiac Sprue, dietary/dairy problems, colitis, ulcers nor gastritis. No recent sick contacts/gastroenteritis. No travel outside the country. No changes in diet. No dysphagia to solids or liquids. No significant heartburn or reflux. No hematochezia, hematemesis, coffee ground emesis. No evidence of prior gastric/peptic ulceration.  Ready for surgery for colectomy & cholecystectomy.  No major new evenets  (Review of systems as stated in this history (HPI) or in the review of systems. Otherwise all other 12 point ROS are negative)   Past Surgical History (Tanisha A. Manson PasseyBrown, RMA; 09/09/2017 9:13 AM) Appendectomy  Breast Biopsy  Left. Knee Surgery  Right. Oral Surgery   Diagnostic Studies History (Tanisha A. Manson PasseyBrown, RMA; 09/09/2017 9:13 AM) Colonoscopy  1-5 years ago Mammogram  within last year Pap Smear  1-5 years ago  Allergies (Tanisha A. Manson PasseyBrown, RMA; 09/09/2017 9:15 AM) Ciprofloxacin HCl *Fluoroquinolones**  Codeine Sulfate *ANALGESICS - OPIOID*  OxyCODONE HCl *ANALGESICS - OPIOID*  Hydrocodone-Acetaminophen *ANALGESICS - OPIOID*  Allergies Reconciled   Medication History (Tanisha A. Manson PasseyBrown, RMA; 09/09/2017 9:15 AM) Elwin Sleightulera (100-5MCG/ACT Aerosol, Inhalation) Active. ValACYclovir HCl (500MG  Tablet, Oral) Active. Otezla (30MG  Tablet, Oral) Active. Augmentin (Oral) Specific strength unknown - Active. Symbicort (80-4.5MCG/ACT Aerosol, Inhalation) Active. Medications Reconciled  Social History (Tanisha A. Manson PasseyBrown, RMA; 09/09/2017 9:13 AM) Alcohol use  Occasional alcohol  use. Caffeine use  Tea. No drug use  Tobacco use  Never smoker.  Family History (Tanisha A. Manson Passey, RMA; 09/09/2017 9:13 AM) Cerebrovascular Accident  Father. Diabetes Mellitus  Brother. Heart Disease  Brother, Father. Heart disease in female family member before age 45  Hypertension  Brother, Father, Mother, Sister. Respiratory Condition  Mother.  Pregnancy / Birth History (Tanisha A. Manson Passey, RMA; 09/09/2017 9:13 AM) Age at menarche  9 years. Age of menopause  38-55 Gravida  6 Length (months) of breastfeeding  3-6 Maternal age  19-20 Para  4  Other Problems (Tanisha A. Manson Passey, RMA; 09/09/2017 9:13 AM) Anxiety Disorder  Arthritis  Asthma  Back Pain  Chest pain  Cholelithiasis  Depression  Diverticulosis  Melanoma  Transfusion history     Review of Systems (Tanisha A. Brown RMA; 09/09/2017 9:13 AM) General Present- Fatigue. Not Present- Appetite Loss, Chills, Fever, Night Sweats, Weight Gain and Weight Loss. Skin Not Present- Change in Wart/Mole, Dryness, Hives, Jaundice, New Lesions, Non-Healing Wounds, Rash and Ulcer. HEENT Present- Seasonal Allergies. Not Present- Earache, Hearing Loss, Hoarseness, Nose Bleed, Oral Ulcers, Ringing in the Ears, Sinus Pain, Sore Throat, Visual Disturbances, Wears glasses/contact lenses and Yellow Eyes. Respiratory Not Present- Bloody sputum, Chronic Cough, Difficulty Breathing, Snoring and Wheezing. Breast Not Present- Breast Mass, Breast Pain, Nipple Discharge and Skin Changes. Cardiovascular Present- Chest Pain. Not Present- Difficulty Breathing Lying Down, Leg Cramps, Palpitations, Rapid Heart Rate, Shortness of Breath and Swelling of Extremities. Gastrointestinal Present- Abdominal Pain and Nausea. Not Present- Bloating, Bloody Stool, Change in Bowel Habits, Chronic diarrhea, Constipation, Difficulty Swallowing, Excessive gas, Gets full quickly at meals, Hemorrhoids, Indigestion, Rectal Pain and Vomiting. Female  Genitourinary Not Present- Frequency, Nocturia, Painful Urination, Pelvic Pain and Urgency. Musculoskeletal Present- Back Pain, Joint Pain and Joint Stiffness. Not Present- Muscle Pain, Muscle Weakness and Swelling of Extremities. Neurological Present- Headaches. Not Present- Decreased Memory, Fainting, Numbness, Seizures, Tingling, Tremor, Trouble walking and Weakness. Psychiatric Not Present- Anxiety, Bipolar, Change in Sleep Pattern, Depression, Fearful and Frequent crying. Endocrine Present- Cold Intolerance. Not Present- Excessive Hunger, Hair Changes, Heat Intolerance, Hot flashes and New Diabetes. Hematology Present- Easy Bruising. Not Present- Blood Thinners, Excessive bleeding, Gland problems, HIV and Persistent Infections.  Vitals (Tanisha A. Brown RMA; 09/09/2017 9:14 AM) 09/09/2017 9:13 AM Weight: 144.8 lb Height: 67in Body Surface Area: 1.76 m Body Mass Index: 22.68 kg/m  Temp.: 98.3F  Pulse: 85 (Regular)  BP: 120/78 (Sitting, Left Arm, Standard)    BP 115/61 (BP Location: Right Arm)   Pulse 66   Temp 98.5 F (36.9 C) (Oral)   Resp 18   Ht 5\' 7"  (1.702 m)   Wt 140 lb (63.5 kg)   LMP 08/30/2010   SpO2 98%   BMI 21.93 kg/m     Physical Exam Ardeth Sportsman MD; 09/09/2017 10:09 AM) General Mental Status-Alert. General Appearance-Not in acute distress, Not Sickly. Orientation-Oriented X3. Hydration-Well hydrated. Voice-Normal.  Integumentary Global Assessment Upon inspection and palpation of skin surfaces of the - Axillae: non-tender, no inflammation or ulceration, no drainage. and Distribution of scalp and body hair is normal. General Characteristics Temperature - normal warmth is noted.  Head and Neck Head-normocephalic, atraumatic with no lesions or palpable masses. Face Global Assessment - atraumatic, no absence of expression. Neck Global Assessment - no abnormal movements, no bruit auscultated on the right, no bruit  auscultated on the left, no decreased range of motion, non-tender. Trachea-midline. Thyroid Gland Characteristics - non-tender.  Eye Eyeball - Left-Extraocular movements intact, No Nystagmus. Eyeball - Right-Extraocular movements intact, No Nystagmus. Cornea - Left-No  Hazy. Cornea - Right-No Hazy. Sclera/Conjunctiva - Left-No scleral icterus, No Discharge. Sclera/Conjunctiva - Right-No scleral icterus, No Discharge. Pupil - Left-Direct reaction to light normal. Pupil - Right-Direct reaction to light normal.  ENMT Ears Pinna - Left - no drainage observed, no generalized tenderness observed. Right - no drainage observed, no generalized tenderness observed. Nose and Sinuses External Inspection of the Nose - no destructive lesion observed. Inspection of the nares - Left - quiet respiration. Right - quiet respiration. Mouth and Throat Lips - Upper Lip - no fissures observed, no pallor noted. Lower Lip - no fissures observed, no pallor noted. Nasopharynx - no discharge present. Oral Cavity/Oropharynx - Tongue - no dryness observed. Oral Mucosa - no cyanosis observed. Hypopharynx - no evidence of airway distress observed.  Chest and Lung Exam Inspection Movements - Normal and Symmetrical. Accessory muscles - No use of accessory muscles in breathing. Palpation Palpation of the chest reveals - Non-tender. Auscultation Breath sounds - Normal and Clear.  Cardiovascular Auscultation Rhythm - Regular. Murmurs & Other Heart Sounds - Auscultation of the heart reveals - No Murmurs and No Systolic Clicks.  Abdomen Inspection Inspection of the abdomen reveals - No Visible peristalsis and No Abnormal pulsations. Umbilicus - No Bleeding, No Urine drainage. Palpation/Percussion Palpation and Percussion of the abdomen reveal - Soft, Non Tender, No Rebound tenderness, No Rigidity (guarding) and No Cutaneous hyperesthesia. Note: Abdomen soft. Not severely distended. No  distasis recti. No umbilical or other anterior abdominal wall hernias.  Mild soreness left lower quadrant without any guarding. Mild epigastric discomfort. No Murphy sign.   Female Genitourinary Sexual Maturity Tanner 5 - Adult hair pattern. Note: No vaginal bleeding nor discharge   Peripheral Vascular Upper Extremity Inspection - Left - No Cyanotic nailbeds, Not Ischemic. Right - No Cyanotic nailbeds, Not Ischemic.  Neurologic Neurologic evaluation reveals -normal attention span and ability to concentrate, able to name objects and repeat phrases. Appropriate fund of knowledge , normal sensation and normal coordination. Mental Status Affect - not angry, not paranoid. Cranial Nerves-Normal Bilaterally. Gait-Normal.  Neuropsychiatric Mental status exam performed with findings of-able to articulate well with normal speech/language, rate, volume and coherence, thought content normal with ability to perform basic computations and apply abstract reasoning and no evidence of hallucinations, delusions, obsessions or homicidal/suicidal ideation. Note: Anxious but consolable   Musculoskeletal Global Assessment Spine, Ribs and Pelvis - no instability, subluxation or laxity. Right Upper Extremity - no instability, subluxation or laxity.  Lymphatic Head & Neck  General Head & Neck Lymphatics: Bilateral - Description - No Localized lymphadenopathy. Axillary  General Axillary Region: Bilateral - Description - No Localized lymphadenopathy. Femoral & Inguinal  Generalized Femoral & Inguinal Lymphatics: Left - Description - No Localized lymphadenopathy. Right - Description - No Localized lymphadenopathy.     CHRONIC CHOLECYSTITIS WITH CALCULUS (K80.10) Impression: Recurrent attacks of biliary colic with postprandial epigastric pain radiating to back and nausea. First attack in 2010. Recurrent attacks despite low fat high fiber diet. I think she would benefit  from cholecystectomy. She is worried about risks of surgery. I tried to frame the issue of the risks of doing nothing versus emergent surgery versus an elective situation. I think she is coming to terms and is ready to consider cholecystectomy  DIVERTICULITIS OF LARGE INTESTINE WITHOUT PERFORATION OR ABSCESS WITHOUT BLEEDING (K57.32) Impression: Flares of proximal sigmoid diverticulitis, evidence by CAT scan in 2017. Had 6 attacks in 5 years. Some intermittent pains not requiring antibiotics in the past 2 years.  Now  more intense soreness and currently is on Augmentin for at least the seventh documented flare. This despite focusing on a low fat high fiber diet with daily MiraLAX and regular bowel function.  I'm concerned given her relative young age and expected life expectancy that she will have repeated issues and would benefit from elective resection as well. Could be a combinational case. She had been encouraged to consider this by her gastroenterologist 2 years ago and was worried since she had a brother-in-law die from complications from colon surgery.  Obtain colonoscopy from last year. I encouraged her to discuss with her gastroenterologist.  She is leaning towards considering surgery since she struggles with recurrent diverticulitis. She is scared to have any surgery but is more concerned about her repeated biliary colic and recurrent diverticulitis continuing and potentially worsening.   She is ready for surgery.   PREOP COLON - ENCOUNTER FOR PREOPERATIVE EXAMINATION FOR GENERAL SURGICAL PROCEDURE (Z61.096) Current Plans Written instructions provided The anatomy & physiology of the digestive tract was discussed. The pathophysiology of the colon was discussed. Natural history risks without surgery was discussed. I feel the risks of no intervention will lead to serious problems that outweigh the operative risks; therefore, I recommended a partial colectomy to remove the pathology.  Minimally invasive (Robotic/Laparoscopic) & open techniques were discussed.  Risks such as bleeding, infection, abscess, leak, reoperation, possible ostomy, hernia, heart attack, death, and other risks were discussed. I noted a good likelihood this will help address the problem. Goals of post-operative recovery were discussed as well. Need for adequate nutrition, daily bowel regimen and healthy physical activity, to optimize recovery was noted as well. We will work to minimize complications. Educational materials were available as well. Questions were answered. The patient expresses understanding & wishes to proceed with surgery.  Ardeth Sportsman, M.D., F.A.C.S. Gastrointestinal and Minimally Invasive Surgery [facility] [address] [place], [place] [address] [phone] Main / Paging

## 2017-10-25 NOTE — Op Note (Signed)
10/25/2017  4:55 PM  PATIENT:  Tara [name]  57 y.o. female  Patient Care Team: [name], [name], MD as PCP - General (Family Medicine) Karie SodaGross, Camari Quintanilla, MD as Consulting Physician (General Surgery) Huston FoleyAthar, Saima, MD as Attending Physician (Neurology) Dorena CookeyHayes, John, MD (Inactive) as Consulting Physician (Gastroenterology) Vida RiggerMagod, Marc, MD as Consulting Physician (Gastroenterology)  PRE-OPERATIVE DIAGNOSIS:    Recurrent proximal sigmoid diverticulitis.   Chronic calculus cholecystitis  POST-OPERATIVE DIAGNOSIS:    Recurrent descending/sigmoid diverticulitis.   Acute on chronic calculus cholecystitis  PROCEDURE:   XI ROBOTIC ASSISTED RESECTION OF DISTAL DESCENDING & SIGMOID COLON RIGID PROCTOSCOPY ROBOTIC ASSISTED LAPAROSCOPIC CHOLECYSTECTOMY TAP BLOCK - bilateral  SURGEON:  Ardeth SportsmanSteven C. Dimetri Armitage, MD  ASSISTANT: Marin Olphristopher White, MD  ANESTHESIA:   local and general  EBL:  Total I/O In: 1000 [I.V.:1000] Out: 250 [Urine:200; Blood:50]  Delay start of Pharmacological VTE agent (>24hrs) due to surgical blood loss or risk of bleeding:  no  DRAINS: none   SPECIMEN:    1.  Distal descending & sigmoid colon 2.  Gallbladder  DISPOSITION OF SPECIMEN:  PATHOLOGY  COUNTS:  YES  PLAN OF CARE: Admit to inpatient   PATIENT DISPOSITION:  PACU - hemodynamically stable.  INDICATION:    Pleasant woman with episodes of biliary colic and more severe attacks concerning for chronic calculus cholecystitis.  Also recurrent attacks of diverticulitis with at least 7 attacks in 2 years.  Seems to be at the descending/sigmoid colon junction.  Chronic soreness and on recurrent oral antibiotics.  Not able to get off them recently.  I recommended cholecystectomy and segmental left-sided colonic resection I recommended segmental resection:  The anatomy & physiology of the digestive tract was discussed.  The pathophysiology was discussed.  Natural history risks without surgery was discussed.   I worked to  give an overview of the disease and the frequent need to have multispecialty involvement.  I feel the risks of no intervention will lead to serious problems that outweigh the operative risks; therefore, I recommended a partial colectomy to remove the pathology.  Minimally invasive & open techniques were discussed.    The anatomy & physiology of hepatobiliary & pancreatic function was discussed.  The pathophysiology of gallbladder dysfunction was discussed.  Natural history risks without surgery was discussed.   I feel the risks of no intervention will lead to serious problems that outweigh the operative risks; therefore, I recommended cholecystectomy to remove the pathology.  I explained laparoscopic techniques with possible need for an open approach.  Probable cholangiogram to evaluate the bilary tract was explained as well.    Risks such as bleeding, infection, abscess, leak, reoperation, possible ostomy, hernia, heart attack, death, and other risks were discussed.  I noted a good likelihood this will help address the problem.   Goals of post-operative recovery were discussed as well.  We will work to minimize complications.  Educational materials on the pathology had been given in the office.  Questions were answered.    The patient expressed understanding & wished to proceed with surgery.  OR FINDINGS:   Patient had somewhat redundant tortuous sigmoid colon with mild but definite thickening at the descending/sigmoid junction.  Resected.  It is a proximal descending colon to rectal end-to-end stapled anastomosis with 33 EEA stapler.  The colorectal anastomosis rests 12 cm from the anal verge by rigid proctoscopy.  Very enlarged gallbladder with moderate omental and transverse colon and duodenal adhesions to it.  Clear colorless "white" bile within it = consistent with acute on  chronic cholecystitis.  DESCRIPTION:   Informed consent was confirmed.  The patient underwent general anaesthesia  without difficulty.  The patient was positioned appropriately.  VTE prevention in place.  The patient's abdomen was clipped, prepped, & draped in a sterile fashion.  Surgical timeout confirmed our plan.  The patient was positioned in reverse Trendelenburg.  Abdominal entry was gained using Varess technique with a trach hook on the anterior abdominal wall fascia in the left subcostal ridge upper abdomen.  Entry was clean.  I induced carbon dioxide insufflation.  Camera inspection revealed no injury.  Extra ports were carefully placed under direct laparoscopic visualization.  We did a bilateral tap block with Exparel mixed with quarter percent plain bupivacaine.  20 mL on each flank.  Block done along the mid axillary line from the costal ridge to the iliac crest under direct visualization in the transversalis and preperitoneal fascia levels to help block the nerves.  Could see a an enlarged boggy gallbladder and right upper quadrant.  I reflected the greater omentum and the upper abdomen the small bowel in the upper abdomen.  The patient was carefully positioned.  The [employer]uitive daVinci robot was carefully docked with camera & instruments carefully placed.  The patient had redundant sigmoid colon twisted in the pelvis.  This was brought up.  Some inflammation and adhesions at the descending/sigmoid junction.  This correlated with the inflammation proximal sigmoid diverticulitis that has been noted on prior  CT scans near the level of the left iliac region.  I scored the base of peritoneum of the medial side of the mesentery of the left colon from the ligament of Treitz to the peritoneal reflection of the mid rectum.   I elevated the sigmoid mesentery and entered into the retro-mesenteric plane. We were able to identify the left ureter and gonadal vessels. We kept those posterior within the retroperitoneum and elevated the left colon mesentery off that. I did isolate the inferior mesenteric artery (IMA) pedicle  but did not ligate it yet.  I continued distally and got into the avascular plane posterior to the mesorectum. This allowed me to help mobilize the rectum as well by freeing the mesorectum off the sacrum.  I mobilized the peritoneal coverings towards the peritoneal reflection on both the right and left sides of the rectum.  I stayed away from the right and left ureters.  I kept the lateral vascular pedicles to the rectum intact.  Got between the window between the inferior mesenteric vein and artery.  Help free the left colon mesentery off the retroperitoneum.  Rather stuck to Gerota's fascia but eventually was able to get a plane and freed that off the left kidney up towards the splenic flexure.  I skeletonized the lymph nodes off the inferior mesenteric artery pedicle.  I went down to its takeoff from the aorta.  I isolated the inferior mesenteric vein off of the ligament of Treitz just cephalad to that as well.  After confirming the left ureter was out of the way, I went ahead and ligated the inferior mesenteric artery pedicle just near its takeoff from the aorta.  I did ligate the inferior mesenteric vein in a similar fashion.  I mobilized the colon in a lateral to medial fashion up towards the splenic flexure.  Could get much better reach for the proximal descending colon to come down to the pelvis.  The ligament of Treitz and jejunum had extra adhesions to the left colon these were carefully freed off to  avoid any tension there.  Preserve the middle colic pedicle which was somewhat shortened.  I held off on freeing the rest of the splenic flexure since after mobilizing the left colon reached down to the pelvis pretty well.    We ensured hemostasis. I skeletonized the mesorectum at the junction at the proximal rectum for the distal point of resection.  I transected the mesorectum at the proximal rectum using a robotic 45 mm stapler.  Came across 90% with the first firing.  Did the left lateral corner with  a second firing.  I chose a region at the proximal/mid descending colon that was soft and easily reached down to the rectal stump.  I transected the mesentery of the colon radially to preserve remaining colon blood supply.  I created an extraction incision through a small Pfannenstiel incision in the suprapubic region.  Placed a wound protector.  I was able to eviscerate the rectosigmoid and descending colon out the wound.  While the thickening was subtle there was some definite thickening at that descending/sigmoid junction.  I came a little more proximally on the descending colon to a softer region just to be sure.   I clamped the colon proximal to this area using a reusable pursestringer device.  Passed a 2-0 Keith needle. I transected the colon with a scalpel. I got healthy bleeding mucosa.  We sent the descending and sigmoid colon specimen off to go to pathology.  We sized the colon orifice.  I chose a 33 EEA anvil stapler system.  I reinforced the prolene pursestring with interrupted silk suture.  I placed the anvil to the open end of the proximal remaining colon and closed around it using the pursestring.    We did copious irrigation with crystalloid solution.  Hemostasis was good.  The distal end of the remaining colon easily reached down to the rectal stump, therefore, splenic flexure mobilization was not needed.      Dr Cliffton Asters scrubbed down and did gentle anal dilation and advanced the EEA stapler up the rectal stump. The spike was brought out at the provimal end of the rectal stump under direct visualization.  I attached the anvil of the proximal colon the spike of the stapler. Anvil was tightened down and held clamped for 60 seconds. The EEA stapler was fired and held clamped for 30 seconds. The stapler was released & removed. We noted 2 excellent anastomotic rings. Blue stitch is in the proximal ring.  Dr Cliffton Asters did rigid proctoscopy noted the anastomosis was at 12 cm from the anal verge consistent  with the proximal rectum.  We did a final irrigation of antibiotic solution (900 mg clindamycin/240 mg gentamicin in a liter of crystalloid) & held that for the pelvic air leak test .  The rectum was insufflated the rectum while clamping the colon proximal to that anastomosis.  There was a negative air leak test. There was no tension of mesentery or bowel at the anastomosis.   Tissues looked viable.  Ureters & bowel uninjured.  The anastomosis looked healthy.  We placed a wound protector gel.  Patient positioned in reverse Trendelenburg and right side up we docked the robot to focus in the right upper quadrant for cholecystectomy.  The gallbladder was extremely distended and could not be grasped.  It was full of clear colorless bile with stones floating in it.  Was able to elevate the right hepatic lobe as it was quite mobile.  This better exposed the gallbladder on the undersurface  of the right hepatic lobe.  He was rather intrahepatic and dilated.  I used a cautery to free peritoneal coverings between the gallbladder and the liver on the anterior medial and posterior lateral walls.  Eventually straightened out the folded infundibulum.  Skeletonized the anterior and posterior cystic artery branches and after getting in a critical view transected them with the vessel sealer on the gallbladder side..  I freed the gallbladder off the liver bed such that half of the gallbladder was off of the liver bed.  It was somewhat intrahepatic.  However that I could better provide tension on the infundibulum.  I skeletonized the short cystic duct.  I placed plastic hemo-lock clips on the cystic duct.  2 on the common bile duct side and one on the infundibular side.  Transected the gallbladder off the cystic duct.  I freed the gallbladder from its remaining attachments on the liver bed using focused cautery scissors.  We did reinspection.  Hemostasis was good.  Robot was undocked.  Gallbladder was removed out of the Pfannenstiel  wound protector.  We will retractor and ports were removed.  We changed gloves & redraped the patient per colon SSI prevention protocol.  We aspirated the antibiotic irrigation.   Hemostasis was good.  Sterile unused instruments were used from this point.  I closed the skin at the port sites using Monocryl stitch and sterile dressing.  I closed the extraction wound using a 0 Vicryl vertical peritoneal closure and a #1 PDS transverse anterior rectal fascial closure like a small Pfannenstiel closure. I closed the skin with some interrupted Monocryl stitches. I placed antibiotic-soaked wicks into the closure at the corners x2.  I placed sterile dressings.     Patient is being extubated go to recovery room. I had discussed postop care with the patient in detail the office.  Discussed with the patient again and her family in the holding area. Instructions are written. I discussed operative findings, updated the patient's status, discussed probable steps to recovery, and gave postoperative recommendations to the patient's family.  Recommendations were made.  Questions were answered.  They expressed understanding & appreciation.  Ardeth Sportsman, M.D., F.A.C.S. Gastrointestinal and Minimally Invasive Surgery [facility] [address] [place], [place] [address] [phone] Main / Paging

## 2017-10-25 NOTE — Significant Event (Signed)
Rapid Response Event Note  Overview: Time Called: 2040 Arrival Time: 2045 Event Type: Hypotension, Respiratory  Initial Focused Assessment:  Called by primary RN due to low HR and low BP, arrived to find patient resting in bed, alert, able to state name and follow commands.  Did complain of some shortness of breath however sats 100% on 2L-[place].  Breath sounds clear but devreased in the bases, pt also having abd surgical site pain that is interfering with pt taking deep breaths.  CXR ordered per RRT protocol.  Current BP 91/43 with about 100ml of a NS bolus infused.  HR 62-64 Sinus Brady.  12 Lead EKG done which appears negative, denies any chest pain.  After 5 min BP up to 122/63, no signs of distress.  Patient responding to fluid bolus.  I waited for one more BP to take which was again in the 120s systolic.  Interventions:  Dr. Michaell CowingGross was called prior to my arrival and gave verbal orders for NS bolus.  I entered CXR orders.  12-lead EKG was done, along with serial vital signs.  Plan of Care (if not transferred):  Pt is complaining of pain, advised RN that I would start with the lowest amount of Dilaudid ordered, which is 0.5mg  IV, now that her BP is stable, and would probably wait until bolus is done.  If BP drops below 90 systolic or any changes in patient condition to please call me back.   Event Summary: Name of Physician Notified: Dr. Michaell CowingGross at (called by primary RN prior to arrival)  Outcome: Stayed in room and stabalized  Event End Time: 2110  Ronnald NianHans C Alexx Mcburney,RN,BSN,CCRN [place] [facility] Care Coordinator / Rapid Response Nurse

## 2017-10-25 NOTE — Discharge Instructions (Signed)
SURGERY: POST OP INSTRUCTIONS °(Surgery for small bowel obstruction, colon resection, etc) ° ° °###################################################################### ° °EAT °Gradually transition to a high fiber diet with a fiber supplement over the next few days after discharge ° °WALK °Walk an hour a day.  Control your pain to do that.   ° °CONTROL PAIN °Control pain so that you can walk, sleep, tolerate sneezing/coughing, go up/down stairs. ° °HAVE A BOWEL MOVEMENT DAILY °Keep your bowels regular to avoid problems.  OK to try a laxative to override constipation.  OK to use an antidairrheal to slow down diarrhea.  Call if not better after 2 tries ° °CALL IF YOU HAVE PROBLEMS/CONCERNS °Call if you are still struggling despite following these instructions. °Call if you have concerns not answered by these instructions ° °###################################################################### ° ° °DIET °Follow a light diet the first few days at home.  Start with a bland diet such as soups, liquids, starchy foods, low fat foods, etc.  If you feel full, bloated, or constipated, stay on a ful liquid or pureed/blenderized diet for a few days until you feel better and no longer constipated. °Be sure to drink plenty of fluids every day to avoid getting dehydrated (feeling dizzy, not urinating, etc.). °Gradually add a fiber supplement to your diet over the next week.  Gradually get back to a regular solid diet.  Avoid fast food or heavy meals the first week as you are more likely to get nauseated. °It is expected for your digestive tract to need a few months to get back to normal.  It is common for your bowel movements and stools to be irregular.  You will have occasional bloating and cramping that should eventually fade away.  Until you are eating solid food normally, off all pain medications, and back to regular activities; your bowels will not be normal. °Focus on eating a low-fat, high fiber diet the rest of your life  (See Getting to Good Bowel Health, below). ° °CARE of your INCISION or WOUND °It is good for closed incision and even open wounds to be washed every day.  Shower every day.  Short baths are fine.  Wash the incisions and wounds clean with soap & water.    °If you have a closed incision(s), wash the incision with soap & water every day.  You may leave closed incisions open to air if it is dry.   You may cover the incision with clean gauze & replace it after your daily shower for comfort. °If you have skin tapes (Steristrips) or skin glue (Dermabond) on your incision, leave them in place.  They will fall off on their own like a scab.  You may trim any edges that curl up with clean scissors.  If you have staples, set up an appointment for them to be removed in the office in 10 days after surgery.  °If you have a drain, wash around the skin exit site with soap & water and place a new dressing of gauze or band aid around the skin every day.  Keep the drain site clean & dry.    °If you have an open wound with packing, see wound care instructions.  In general, it is encouraged that you remove your dressing and packing, shower with soap & water, and replace your dressing once a day.  Pack the wound with clean gauze moistened with normal (0.9%) saline to keep the wound moist & uninfected.  Pressure on the dressing for 30 minutes will stop most wound   bleeding.  Eventually your body will heal & pull the open wound closed over the next few months.  °Raw open wounds will occasionally bleed or secrete yellow drainage until it heals closed.  Drain sites will drain a little until the drain is removed.  Even closed incisions can have mild bleeding or drainage the first few days until the skin edges scab over & seal.   °If you have an open wound with a wound vac, see wound vac care instructions. ° ° ° ° °ACTIVITIES as tolerated °Start light daily activities --- self-care, walking, climbing stairs-- beginning the day after surgery.   Gradually increase activities as tolerated.  Control your pain to be active.  Stop when you are tired.  Ideally, walk several times a day, eventually an hour a day.   °Most people are back to most day-to-day activities in a few weeks.  It takes 4-8 weeks to get back to unrestricted, intense activity. °If you can walk 30 minutes without difficulty, it is safe to try more intense activity such as jogging, treadmill, bicycling, low-impact aerobics, swimming, etc. °Save the most intensive and strenuous activity for last (Usually 4-8 weeks after surgery) such as sit-ups, heavy lifting, contact sports, etc.  Refrain from any intense heavy lifting or straining until you are off narcotics for pain control.  You will have off days, but things should improve week-by-week. °DO NOT PUSH THROUGH PAIN.  Let pain be your guide: If it hurts to do something, don't do it.  Pain is your body warning you to avoid that activity for another week until the pain goes down. °You may drive when you are no longer taking narcotic prescription pain medication, you can comfortably wear a seatbelt, and you can safely make sudden turns/stops to protect yourself without hesitating due to pain. °You may have sexual intercourse when it is comfortable. If it hurts to do something, stop. ° °MEDICATIONS °Take your usually prescribed home medications unless otherwise directed.   °Blood thinners:  °Usually you can restart any strong blood thinners after the second postoperative day.  It is OK to take aspirin right away.    ° If you are on strong blood thinners (warfarin/Coumadin, Plavix, Xerelto, Eliquis, Pradaxa, etc), discuss with your surgeon, medicine PCP, and/or cardiologist for instructions on when to restart the blood thinner & if blood monitoring is needed (PT/INR blood check, etc).   ° ° °PAIN CONTROL °Pain after surgery or related to activity is often due to strain/injury to muscle, tendon, nerves and/or incisions.  This pain is usually  short-term and will improve in a few months.  °To help speed the process of healing and to get back to regular activity more quickly, DO THE FOLLOWING THINGS TOGETHER: °1. Increase activity gradually.  DO NOT PUSH THROUGH PAIN °2. Use Ice and/or Heat °3. Try Gentle Massage and/or Stretching °4. Take over the counter pain medication °5. Take Narcotic prescription pain medication for more severe pain ° °Good pain control = faster recovery.  It is better to take more medicine to be more active than to stay in bed all day to avoid medications. °1.  Increase activity gradually °Avoid heavy lifting at first, then increase to lifting as tolerated over the next 6 weeks. °Do not “push through” the pain.  Listen to your body and avoid positions and maneuvers than reproduce the pain.  Wait a few days before trying something more intense °Walking an hour a day is encouraged to help your body recover faster   and more safely.  Start slowly and stop when getting sore.  If you can walk 30 minutes without stopping or pain, you can try more intense activity (running, jogging, aerobics, cycling, swimming, treadmill, sex, sports, weightlifting, etc.) °Remember: If it hurts to do it, then don’t do it! °2. Use Ice and/or Heat °You will have swelling and bruising around the incisions.  This will take several weeks to resolve. °Ice packs or heating pads (6-8 times a day, 30-60 minutes at a time) will help sooth soreness & bruising. °Some people prefer to use ice alone, heat alone, or alternate between ice & heat.  Experiment and see what works best for you.  Consider trying ice for the first few days to help decrease swelling and bruising; then, switch to heat to help relax sore spots and speed recovery. °Shower every day.  Short baths are fine.  It feels good!  Keep the incisions and wounds clean with soap & water.   °3. Try Gentle Massage and/or Stretching °Massage at the area of pain many times a day °Stop if you feel pain - do not  overdo it °4. Take over the counter pain medication °This helps the muscle and nerve tissues become less irritable and calm down faster °Choose ONE of the following over-the-counter anti-inflammatory medications: °Acetaminophen 500mg tabs (Tylenol) 1-2 pills with every meal and just before bedtime (avoid if you have liver problems or if you have acetaminophen in you narcotic prescription) °Naproxen 220mg tabs (ex. Aleve, Naprosyn) 1-2 pills twice a day (avoid if you have kidney, stomach, IBD, or bleeding problems) °Ibuprofen 200mg tabs (ex. Advil, Motrin) 3-4 pills with every meal and just before bedtime (avoid if you have kidney, stomach, IBD, or bleeding problems) °Take with food/snack several times a day as directed for at least 2 weeks to help keep pain / soreness down & more manageable. °5. Take Narcotic prescription pain medication for more severe pain °A prescription for strong pain control is often given to you upon discharge (for example: oxycodone/Percocet, hydrocodone/Norco/Vicodin, or tramadol/Ultram) °Take your pain medication as prescribed. °Be mindful that most narcotic prescriptions contain Tylenol (acetaminophen) as well - avoid taking too much Tylenol. °If you are having problems/concerns with the prescription medicine (does not control pain, nausea, vomiting, rash, itching, etc.), please call us [phone] to see if we need to switch you to a different pain medicine that will work better for you and/or control your side effects better. °If you need a refill on your pain medication, you must call the office before 4 pm and on weekdays only.  By federal law, prescriptions for narcotics cannot be called into a pharmacy.  They must be filled out on paper & picked up from our office by the patient or authorized caretaker.  Prescriptions cannot be filled after 4 pm nor on weekends.   ° °WHEN TO CALL US [phone] °Severe uncontrolled or worsening pain  °Fever over 101 F (38.5 C) °Concerns with  the incision: Worsening pain, redness, rash/hives, swelling, bleeding, or drainage °Reactions / problems with new medications (itching, rash, hives, nausea, etc.) °Nausea and/or vomiting °Difficulty urinating °Difficulty breathing °Worsening fatigue, dizziness, lightheadedness, blurred vision °Other concerns °If you are not getting better after two weeks or are noticing you are getting worse, contact our office [phone] for further advice.  We may need to adjust your medications, re-evaluate you in the office, send you to the emergency room, or see what other things we can do to help. °The   clinic staff is available to answer your questions during regular business hours (8:30am-5pm).  Please don’t hesitate to call and ask to speak to one of our nurses for clinical concerns.    °A surgeon from [facility] is always on call at the hospitals 24 hours/day °If you have a medical emergency, go to the nearest emergency room or call 911. ° °FOLLOW UP in our office °One the day of your discharge from the hospital (or the next business weekday), please call [facility] to set up or confirm an appointment to see your surgeon in the office for a follow-up appointment.  Usually it is 2-3 weeks after your surgery.   °If you have skin staples at your incision(s), let the office know so we can set up a time in the office for the nurse to remove them (usually around 10 days after surgery). °Make sure that you call for appointments the day of discharge (or the next business weekday) from the hospital to ensure a convenient appointment time. °IF YOU HAVE DISABILITY OR FAMILY LEAVE FORMS, BRING THEM TO THE OFFICE FOR PROCESSING.  DO NOT GIVE THEM TO YOUR DOCTOR. ° °[facility] °[address], [address], [place], [place]  [address] ? °[phone] - Main °[phone] - Toll Free,  [phone] - Fax °[email or web address] ° °GETTING TO GOOD BOWEL HEALTH. °It is  expected for your digestive tract to need a few months to get back to normal.  It is common for your bowel movements and stools to be irregular.  You will have occasional bloating and cramping that should eventually fade away.  Until you are eating solid food normally, off all pain medications, and back to regular activities; your bowels will not be normal.   °Avoiding constipation °The goal: ONE SOFT BOWEL MOVEMENT A DAY!    °Drink plenty of fluids.  Choose water first. °TAKE A FIBER SUPPLEMENT EVERY DAY THE REST OF YOUR LIFE °During your first week back home, gradually add back a fiber supplement every day °Experiment which form you can tolerate.   There are many forms such as powders, tablets, wafers, gummies, etc °Psyllium bran (Metamucil), methylcellulose (Citrucel), Miralax or Glycolax, Benefiber, Flax Seed.  °Adjust the dose week-by-week (1/2 dose/day to 6 doses a day) until you are moving your bowels 1-2 times a day.  Cut back the dose or try a different fiber product if it is giving you problems such as diarrhea or bloating. °Sometimes a laxative is needed to help jump-start bowels if constipated until the fiber supplement can help regulate your bowels.  If you are tolerating eating & you are farting, it is okay to try a gentle laxative such as double dose MiraLax, prune juice, or Milk of Magnesia.  Avoid using laxatives too often. °Stool softeners can sometimes help counteract the constipating effects of narcotic pain medicines.  It can also cause diarrhea, so avoid using for too long. °If you are still constipated despite taking fiber daily, eating solids, and a few doses of laxatives, call our office. °Controlling diarrhea °Try drinking liquids and eating bland foods for a few days to avoid stressing your intestines further. °Avoid dairy products (especially milk & ice cream) for a short time.  The intestines often can lose the ability to digest lactose when stressed. °Avoid foods that cause gassiness or  bloating.  Typical foods include beans and other legumes, cabbage, broccoli, and dairy foods.  Avoid greasy, spicy, fast foods.  Every person has   some sensitivity to other foods, so listen to your body and avoid those foods that trigger problems for you. °Probiotics (such as active yogurt, Align, etc) may help repopulate the intestines and colon with normal bacteria and calm down a sensitive digestive tract °Adding a fiber supplement gradually can help thicken stools by absorbing excess fluid and retrain the intestines to act more normally.  Slowly increase the dose over a few weeks.  Too much fiber too soon can backfire and cause cramping & bloating. °It is okay to try and slow down diarrhea with a few doses of antidiarrheal medicines.   °Bismuth subsalicylate (ex. Kayopectate, Pepto Bismol) for a few doses can help control diarrhea.  Avoid if pregnant.   °Loperamide (Imodium) can slow down diarrhea.  Start with one tablet (2mg) first.  Avoid if you are having fevers or severe pain.  °ILEOSTOMY PATIENTS WILL HAVE CHRONIC DIARRHEA since their colon is not in use.    °Drink plenty of liquids.  You will need to drink even more glasses of water/liquid a day to avoid getting dehydrated. °Record output from your ileostomy.  Expect to empty the bag every 3-4 hours at first.  Most people with a permanent ileostomy empty their bag 4-6 times at the least.   °Use antidiarrheal medicine (especially Imodium) several times a day to avoid getting dehydrated.  Start with a dose at bedtime & breakfast.  Adjust up or down as needed.  Increase antidiarrheal medications as directed to avoid emptying the bag more than 8 times a day (every 3 hours). °Work with your wound ostomy nurse to learn care for your ostomy.  See ostomy care instructions. °TROUBLESHOOTING IRREGULAR BOWELS °1) Start with a soft & bland diet. No spicy, greasy, or fried foods.  °2) Avoid gluten/wheat or dairy products from diet to see if symptoms improve. °3) Miralax  17gm or flax seed mixed in 8oz. water or juice-daily. May use 2-4 times a day as needed. °4) Gas-X, Phazyme, etc. as needed for gas & bloating.  °5) Prilosec (omeprazole) over-the-counter as needed °6)  Consider probiotics (Align, Activa, etc) to help calm the bowels down ° °Call your doctor if you are getting worse or not getting better.  Sometimes further testing (cultures, endoscopy, X-ray studies, CT scans, bloodwork, etc.) may be needed to help diagnose and treat the cause of the diarrhea. °[facility] °[address], [address], [place], [place]  [address] °[phone] - Main.    °[phone]  - Toll Free.   [phone] - Fax °[email or web address] ° ° °Diverticulitis °Diverticulitis is infection or inflammation of small pouches (diverticula) in the colon that form due to a condition called diverticulosis. Diverticula can trap stool (feces) and bacteria, causing infection and inflammation. °Diverticulitis may cause severe stomach pain and diarrhea. It may lead to tissue damage in the colon that causes bleeding. The diverticula may also burst (rupture) and cause infected stool to enter other areas of the abdomen. °Complications of diverticulitis can include: °· Bleeding. °· Severe infection. °· Severe pain. °· Rupture (perforation) of the colon. °· Blockage (obstruction) of the colon. ° °What are the causes? °This condition is caused by stool becoming trapped in the diverticula, which allows bacteria to grow in the diverticula. This leads to inflammation and infection. °What increases the risk? °You are more likely to develop this condition if: °· You have diverticulosis. The risk for diverticulosis increases if: °? You are overweight or obese. °? You use tobacco products. °? You do   not get enough exercise. °· You eat a diet that does not include enough fiber. High-fiber foods include fruits, vegetables, beans, nuts, and whole grains. ° °What are the signs or  symptoms? °Symptoms of this condition may include: °· Pain and tenderness in the abdomen. The pain is normally located on the left side of the abdomen, but it may occur in other areas. °· Fever and chills. °· Bloating. °· Cramping. °· Nausea. °· Vomiting. °· Changes in bowel routines. °· Blood in your stool. ° °How is this diagnosed? °This condition is diagnosed based on: °· Your medical history. °· A physical exam. °· Tests to make sure there is nothing else causing your condition. These tests may include: °? Blood tests. °? Urine tests. °? Imaging tests of the abdomen, including X-rays, ultrasounds, MRIs, or CT scans. ° °How is this treated? °Most cases of this condition are mild and can be treated at home. Treatment may include: °· Taking over-the-counter pain medicines. °· Following a clear liquid diet. °· Taking antibiotic medicines by mouth. °· Rest. ° °More severe cases may need to be treated at a hospital. Treatment may include: °· Not eating or drinking. °· Taking prescription pain medicine. °· Receiving antibiotic medicines through an IV tube. °· Receiving fluids and nutrition through an IV tube. °· Surgery. ° °When your condition is under control, your health care provider may recommend that you have a colonoscopy. This is an exam to look at the entire large intestine. During the exam, a lubricated, bendable tube is inserted into the anus and then passed into the rectum, colon, and other parts of the large intestine. A colonoscopy can show how severe your diverticula are and whether something else may be causing your symptoms. °Follow these instructions at home: °Medicines °· Take over-the-counter and prescription medicines only as told by your health care provider. These include fiber supplements, probiotics, and stool softeners. °· If you were prescribed an antibiotic medicine, take it as told by your health care provider. Do not stop taking the antibiotic even if you start to feel better. °· Do not  drive or use heavy machinery while taking prescription pain medicine. °General instructions °· Follow a full liquid diet or another diet as directed by your health care provider. After your symptoms improve, your health care provider may tell you to change your diet. He or she may recommend that you eat a diet that contains at least 25 g (25 grams) of fiber daily. Fiber makes it easier to pass stool. Healthy sources of fiber include: °? Berries. One cup contains 4-8 grams of fiber. °? Beans or lentils. One half cup contains 5-8 grams of fiber. °? Green vegetables. One cup contains 4 grams of fiber. °· Exercise for at least 30 minutes, 3 times each week. You should exercise hard enough to raise your heart rate and break a sweat. °· Keep all follow-up visits as told by your health care provider. This is important. You may need a colonoscopy. °Contact a health care provider if: °· Your pain does not improve. °· You have a hard time drinking or eating food. °· Your bowel movements do not return to normal. °Get help right away if: °· Your pain gets worse. °· Your symptoms do not get better with treatment. °· Your symptoms suddenly get worse. °· You have a fever. °· You vomit more than one time. °· You have stools that are bloody, black, or tarry. °Summary °· Diverticulitis is infection or inflammation of small pouches (  diverticula) in the colon that form due to a condition called diverticulosis. Diverticula can trap stool (feces) and bacteria, causing infection and inflammation. °· You are at higher risk for this condition if you have diverticulosis and you eat a diet that does not include enough fiber. °· Most cases of this condition are mild and can be treated at home. More severe cases may need to be treated at a hospital. °· When your condition is under control, your health care provider may recommend that you have an exam called a colonoscopy. This exam can show how severe your diverticula are and whether something  else may be causing your symptoms. °This information is not intended to replace advice given to you by your health care provider. Make sure you discuss any questions you have with your health care provider. °Document Released: 04/11/2005 Document Revised: 08/04/2016 Document Reviewed: 08/04/2016 °[employer] Interactive Patient Education © 2018 [employer] ° °

## 2017-10-25 NOTE — Interval H&P Note (Signed)
History and Physical Interval Note:  10/25/2017 12:19 PM  Tara [name]  has presented today for surgery, with the diagnosis of Recurrent sigmoid diverticulitis.  Chronic cholecystitis  The various methods of treatment have been discussed with the patient and family. After consideration of risks, benefits and other options for treatment, the patient has consented to  Procedure(s): XI ROBOTIC ASSISTED RESECTION OF SIGMOID COLON ERAS PATHWAY (N/A) RIGID PROCTOSCOPY (N/A) LAPAROSCOPIC CHOLECYSTECTOMY (N/A) as a surgical intervention .  The patient's history has been reviewed, patient examined, no change in status, stable for surgery.  I have reviewed the patient's chart and labs.  Questions were answered to the patient's satisfaction.    I have re-reviewed the the patient's records, history, medications, and allergies.  I have re-examined the patient.  I again discussed intraoperative plans and goals of post-operative recovery.  The patient agrees to proceed.  Tara [name]  [birth year] [id number]  Patient Care Team: Merri Brunette, MD as PCP - General (Family Medicine) Karie Soda, MD as Consulting Physician (General Surgery) Huston Foley, MD as Attending Physician (Neurology) Dorena Cookey, MD (Inactive) as Consulting Physician (Gastroenterology) Vida Rigger, MD as Consulting Physician (Gastroenterology)  Patient Active Problem List   Diagnosis Date Noted  . Left upper extremity swelling   . Diverticulitis 01/29/2016  . Diverticulitis large intestine 01/29/2016  . Chest pain 01/25/2016  . Pain in the chest   . Diverticulitis of large intestine without perforation or abscess without bleeding 01/24/2016  . Abnormal urinalysis 01/24/2016  . Asthma in adult 01/24/2016  . Acute cystitis without hematuria   . Gastroesophageal reflux disease without esophagitis   . Memory changes 01/18/2016  . Severe episode of recurrent major depressive disorder, without psychotic features (HCC)   . Insomnia    . Generalized anxiety disorder   . MDD (major depressive disorder), recurrent severe, without psychosis (HCC) 09/30/2015    Past Medical History:  Diagnosis Date  . ADD (attention deficit disorder)   . Asthma   . Depression   . Diverticulitis   . Psoriasis     Past Surgical History:  Procedure Laterality Date  . APPENDECTOMY    . KNEE SURGERY Right     Social History   Socioeconomic History  . Marital status: Legally Separated    Spouse name: Not on file  . Number of children: 4  . Years of education: college  . Highest education level: Not on file  Occupational History  . Occupation: Academic librarian   Social Needs  . Financial resource strain: Not on file  . Food insecurity:    Worry: Not on file    Inability: Not on file  . Transportation needs:    Medical: Not on file    Non-medical: Not on file  Tobacco Use  . Smoking status: Never Smoker  . Smokeless tobacco: Never Used  Substance and Sexual Activity  . Alcohol use: Yes    Alcohol/week: 0.0 oz    Comment: occasionally  . Drug use: No  . Sexual activity: Yes  Lifestyle  . Physical activity:    Days per week: Not on file    Minutes per session: Not on file  . Stress: Not on file  Relationships  . Social connections:    Talks on phone: Not on file    Gets together: Not on file    Attends religious service: Not on file    Active member of club or organization: Not on file    Attends meetings of clubs or organizations:  Not on file    Relationship status: Not on file  . Intimate partner violence:    Fear of current or ex partner: Not on file    Emotionally abused: Not on file    Physically abused: Not on file    Forced sexual activity: Not on file  Other Topics Concern  . Not on file  Social History Narrative   Rare caffeine use     Family History  Problem Relation Age of Onset  . COPD Mother   . Hypertension Mother   . Heart failure Father   . Hypertension Father   . Hypertension Sister   .  Heart attack Brother   . Hypertension Brother   . Diabetes Brother   . Diabetes Maternal Grandmother   . Breast cancer Maternal Aunt   . Breast cancer Cousin   . Dementia Neg Hx     Medications Prior to Admission  Medication Sig Dispense Refill Last Dose  . Apremilast (OTEZLA) 30 MG TABS Take 1 tablet by mouth 2 (two) times daily.   10/24/2017 at Unknown time  . Calcium Carbonate-Vitamin D (CALTRATE 600+D) 600-400 MG-UNIT tablet Take 1 tablet by mouth daily.   10/24/2017 at Unknown time  . metroNIDAZOLE (METROGEL) 0.75 % gel Apply 1 application topically daily as needed.   10/24/2017 at Unknown time  . mometasone (NASONEX) 50 MCG/ACT nasal spray Place 1 spray into the nose daily as needed.    Past Week at Unknown time  . mometasone-formoterol (DULERA) 100-5 MCG/ACT AERO Inhale 2 puffs into the lungs 2 (two) times daily.    10/24/2017 at Unknown time  . montelukast (SINGULAIR) 10 MG tablet Take 1 tablet by mouth at bedtime.    10/23/2017  . Multiple Vitamins-Minerals (MULTIVITAMIN ADULT PO) Take 1 tablet by mouth daily.   10/24/2017 at Unknown time  . polyethylene glycol (MIRALAX / GLYCOLAX) packet Take 17 g by mouth 2 (two) times daily.   10/24/2017 at Unknown time  . valACYclovir (VALTREX) 500 MG tablet Take 500 mg by mouth daily.   10/24/2017 at Unknown time    Current Facility-Administered Medications  Medication Dose Route Frequency Provider Last Rate Last Dose  . bisacodyl (DULCOLAX) EC tablet 20 mg  20 mg Oral Once Karie SodaGross, Anicia Leuthold, MD      . cefoTEtan in Dextrose 5% (CEFOTAN) IVPB 2 g  2 g Intravenous On Call to OR Karie SodaGross, Delany Steury, MD      . clindamycin (CLEOCIN) 900 mg, gentamicin (GARAMYCIN) 240 mg in sodium chloride 0.9 % 1,000 mL for intraperitoneal lavage   Intraperitoneal To OR Karie SodaGross, Burgandy Hackworth, MD      . lactated ringers infusion   Intravenous Continuous Dorris SinghGreen, Charlene, MD 50 mL/hr at 10/25/17 1022 1,000 mL at 10/25/17 1022  . neomycin (MYCIFRADIN) tablet 1,000 mg  1,000 mg Oral 3 times  per day Karie SodaGross, Willliam Pettet, MD       And  . metroNIDAZOLE (FLAGYL) tablet 1,000 mg  1,000 mg Oral 3 times per day Karie SodaGross, Gatsby Chismar, MD      . polyethylene glycol powder (GLYCOLAX/MIRALAX) container 255 g  1 Container Oral Once Karie SodaGross, Mavrick Mcquigg, MD       Facility-Administered Medications Ordered in Other Encounters  Medication Dose Route Frequency Provider Last Rate Last Dose  . gadopentetate dimeglumine (MAGNEVIST) injection 13 mL  13 mL Intravenous Once PRN Anson FretAhern, Antonia B, MD         Allergies  Allergen Reactions  . Ciprofloxacin Hcl Rash  . Codeine Itching  HYDROCODONE AND OXYCODONE ALSO    BP 115/61 (BP Location: Right Arm)   Pulse 66   Temp 98.5 F (36.9 C) (Oral)   Resp 18   Ht 5\' 7"  (1.702 m)   Wt 140 lb (63.5 kg)   LMP 08/30/2010   SpO2 98%   BMI 21.93 kg/m   Labs: Results for orders placed or performed during the hospital encounter of 10/25/17 (from the past 48 hour(s))  Type and screen [facility]     Status: None   Collection Time: 10/25/17 10:20 AM  Result Value Ref Range   ABO/RH(D) O POS    Antibody Screen NEG    Sample Expiration      10/28/2017 Performed at [facility], [address], [place], [place] [address]   ABO/Rh     Status: None   Collection Time: 10/25/17 10:20 AM  Result Value Ref Range   ABO/RH(D)      O POS Performed at [facility], [address] [place], [place] [address]     Imaging / Studies: No results found.   Ardeth Sportsman, M.D., F.A.C.S. Gastrointestinal and Minimally Invasive Surgery [facility] [address], [address] [place], [place] [address] [phone] Main / Paging  10/25/2017 12:19 PM    Ardeth Sportsman

## 2017-10-25 NOTE — Transfer of Care (Signed)
Immediate Anesthesia Transfer of Care Note  Patient: Tara [name]  Procedure(s) Performed: XI ROBOTIC ASSISTED RESECTION OF SIGMOID COLON ERAS PATHWAY (N/A Abdomen) RIGID PROCTOSCOPY (N/A Rectum) ROBOTIC ASSISTED LAPAROSCOPIC CHOLECYSTECTOMY (N/A Abdomen)  Patient Location: PACU  Anesthesia Type:General  Level of Consciousness: sedated, patient cooperative and responds to stimulation  Airway & Oxygen Therapy: Patient Spontanous Breathing and Patient connected to face mask oxygen  Post-op Assessment: Report given to RN and Post -op Vital signs reviewed and stable  Post vital signs: Reviewed and stable  Last Vitals:  Vitals Value Taken Time  BP 144/63 10/25/2017  5:00 PM  Temp    Pulse 55 10/25/2017  5:01 PM  Resp 21 10/25/2017  5:01 PM  SpO2 100 % 10/25/2017  5:01 PM  Vitals shown include unvalidated device data.  Last Pain:  Vitals:   10/25/17 0940  TempSrc: Oral      Patients Stated Pain Goal: 4 (10/25/17 1002)  Complications: No apparent anesthesia complications

## 2017-10-25 NOTE — Anesthesia Procedure Notes (Signed)
Procedure Name: Intubation Date/Time: 10/25/2017 1:21 PM Performed by: Dione Booze, CRNA Pre-anesthesia Checklist: Suction available, Patient being monitored, Emergency Drugs available and Patient identified Patient Re-evaluated:Patient Re-evaluated prior to induction Oxygen Delivery Method: Circle system utilized Preoxygenation: Pre-oxygenation with 100% oxygen Induction Type: IV induction Ventilation: Mask ventilation without difficulty Laryngoscope Size: Mac and 4 Grade View: Grade I Tube type: Oral Tube size: 7.5 mm Number of attempts: 1 Airway Equipment and Method: Stylet Placement Confirmation: positive ETCO2,  ETT inserted through vocal cords under direct vision and breath sounds checked- equal and bilateral Secured at: 22 cm Tube secured with: Tape Dental Injury: Teeth and Oropharynx as per pre-operative assessment

## 2017-10-25 NOTE — Anesthesia Preprocedure Evaluation (Addendum)
Anesthesia Evaluation  Patient identified by MRN, date of birth, ID band Patient awake    Reviewed: Allergy & Precautions, NPO status , Patient's Chart, lab work & pertinent test results  Airway Mallampati: II  TM Distance: >3 FB     Dental   Pulmonary asthma ,    breath sounds clear to auscultation       Cardiovascular negative cardio ROS   Rhythm:Regular Rate:Normal     Neuro/Psych    GI/Hepatic Neg liver ROS, GERD  ,  Endo/Other  negative endocrine ROS  Renal/GU negative Renal ROS     Musculoskeletal   Abdominal   Peds  Hematology   Anesthesia Other Findings   Reproductive/Obstetrics                            Anesthesia Physical Anesthesia Plan  ASA: III  Anesthesia Plan: General   Post-op Pain Management:    Induction: Intravenous  PONV Risk Score and Plan: Treatment may vary due to age or medical condition, Ondansetron, Dexamethasone and Midazolam  Airway Management Planned: Oral ETT  Additional Equipment:   Intra-op Plan:   Post-operative Plan: Possible Post-op intubation/ventilation  Informed Consent: I have reviewed the patients History and Physical, chart, labs and discussed the procedure including the risks, benefits and alternatives for the proposed anesthesia with the patient or authorized representative who has indicated his/her understanding and acceptance.   Dental advisory given  Plan Discussed with: CRNA and Anesthesiologist  Anesthesia Plan Comments:         Anesthesia Quick Evaluation

## 2017-10-26 ENCOUNTER — Encounter (HOSPITAL_COMMUNITY): Payer: Self-pay

## 2017-10-26 LAB — BASIC METABOLIC PANEL
ANION GAP: 12 (ref 5–15)
BUN: 17 mg/dL (ref 6–20)
CO2: 18 mmol/L — ABNORMAL LOW (ref 22–32)
Calcium: 7.8 mg/dL — ABNORMAL LOW (ref 8.9–10.3)
Chloride: 107 mmol/L (ref 101–111)
Creatinine, Ser: 1.35 mg/dL — ABNORMAL HIGH (ref 0.44–1.00)
GFR, EST AFRICAN AMERICAN: 49 mL/min — AB (ref >60)
GFR, EST NON AFRICAN AMERICAN: 42 mL/min — AB (ref >60)
GLUCOSE: 171 mg/dL — AB (ref 65–99)
POTASSIUM: 4 mmol/L (ref 3.5–5.1)
Sodium: 137 mmol/L (ref 135–145)

## 2017-10-26 LAB — CBC
HCT: 28.2 % — ABNORMAL LOW (ref 36.0–46.0)
HEMOGLOBIN: 9.3 g/dL — AB (ref 12.0–15.0)
MCH: 32.3 pg (ref 26.0–34.0)
MCHC: 33 g/dL (ref 30.0–36.0)
MCV: 97.9 fL (ref 78.0–100.0)
Platelets: 255 10*3/uL (ref 150–400)
RBC: 2.88 MIL/uL — AB (ref 3.87–5.11)
RDW: 13.1 % (ref 11.5–15.5)
WBC: 13.9 10*3/uL — ABNORMAL HIGH (ref 4.0–10.5)

## 2017-10-26 MED ORDER — SODIUM CHLORIDE 0.9% FLUSH
3.0000 mL | INTRAVENOUS | Status: DC | PRN
Start: 2017-10-26 — End: 2017-10-28

## 2017-10-26 MED ORDER — LACTATED RINGERS IV BOLUS: 1000.0000 mL | Freq: Three times a day (TID) | INTRAVENOUS | Status: AC | PRN

## 2017-10-26 MED ORDER — SODIUM CHLORIDE 0.9 % IV SOLN
250.0000 mL | INTRAVENOUS | Status: DC | PRN
  Administered 2017-10-26: 250 mL via INTRAVENOUS

## 2017-10-26 MED ORDER — METOPROLOL TARTRATE 5 MG/5ML IV SOLN: 5.0000 mg | Freq: Four times a day (QID) | INTRAVENOUS | Status: DC | PRN

## 2017-10-26 MED ORDER — LACTATED RINGERS IV BOLUS
1000.0000 mL | Freq: Once | INTRAVENOUS | Status: AC
  Administered 2017-10-26: 1000 mL via INTRAVENOUS

## 2017-10-26 MED ORDER — HYDRALAZINE HCL 20 MG/ML IJ SOLN: 10.0000 mg | INTRAMUSCULAR | Status: DC | PRN

## 2017-10-26 MED ORDER — ENOXAPARIN SODIUM 40 MG/0.4ML ~~LOC~~ SOLN
40.0000 mg | SUBCUTANEOUS | Status: DC
  Administered 2017-10-26: 40 mg via SUBCUTANEOUS
  Filled 2017-10-26: qty 0.4

## 2017-10-26 MED ORDER — SODIUM CHLORIDE 0.9% FLUSH: 3.0000 mL | Freq: Two times a day (BID) | INTRAVENOUS | Status: DC

## 2017-10-26 MED ORDER — METHOCARBAMOL 500 MG PO TABS
750.0000 mg | ORAL_TABLET | Freq: Three times a day (TID) | ORAL | Status: AC
  Administered 2017-10-26 (×2): 750 mg via ORAL
  Filled 2017-10-26 (×2): qty 2

## 2017-10-26 NOTE — Progress Notes (Addendum)
[facility]  [address] [place], [place] [address] Phone: [phone]  FAX: [phone]      Cristiana Yochim [id number] [birth year]  CARE TEAM:  PCP: Carol Ada, MD  Outpatient Care Team: Patient Care Team: Carol Ada, MD as PCP - General (Family Medicine) Michael Boston, MD as Consulting Physician (General Surgery) Star Age, MD as Attending Physician (Neurology) Teena Irani, MD (Inactive) as Consulting Physician (Gastroenterology) Clarene Essex, MD as Consulting Physician (Gastroenterology)  Inpatient Treatment Team: Treatment Team: Attending Provider: Michael Boston, MD; Registered Nurse: Heloise Ochoa, RN; Technician: Abbe Amsterdam, NT   Problem List:   Principal Problem:   Diverticulitis of left colon s/p left colectomy 10/25/2017 Active Problems:   Asthma in adult   Acute cholecystitis with chronic cholecystitis s/p lap cholecystectomy 10/25/2017   1 Day Post-Op  10/25/2017  POST-OPERATIVE DIAGNOSIS:    Recurrent descending/sigmoid diverticulitis.   Acute on chronic calculus cholecystitis  PROCEDURE:   XI ROBOTIC ASSISTED RESECTION OF DISTAL DESCENDING & SIGMOID COLON RIGID PROCTOSCOPY ROBOTIC ASSISTED LAPAROSCOPIC CHOLECYSTECTOMY TAP BLOCK - bilateral  SURGEON:  Adin Hector, MD  OR FINDINGS:   Patient had somewhat redundant tortuous sigmoid colon with mild but definite thickening at the descending/sigmoid junction.  Resected.  It is a proximal descending colon to rectal end-to-end stapled anastomosis with 33 EEA stapler.  The colorectal anastomosis rests 12 cm from the anal verge by rigid proctoscopy.  Very enlarged gallbladder with moderate omental and transverse colon and duodenal adhesions to it.  Clear colorless "white" bile within it = consistent with acute on chronic cholecystitis.       Assessment  OK  Plan:  -adv diet -follow off IVF w PRN back up - follow BP & UOP  closely -low Hgb - BP OK - follow off lovenox.  Check Hgb in AM -improve pain control -VTE prophylaxis- SCDs, etc -mobilize as tolerated to help recovery  20 minutes spent in review, evaluation, examination, counseling, and coordination of care.  More than 50% of that time was spent in counseling.  Adin Hector, M.D., F.A.C.S. Gastrointestinal and Minimally Invasive Surgery [facility] [address] [place], [place] [address] [phone] Main / Paging   10/26/2017    Subjective: (Chief complaint)  Tired but walking more.  No nausea.  Wants to eat more.  Passing gas  Objective:  Vital signs:  Vitals:   10/26/17 0040 10/26/17 0500 10/26/17 0541 10/26/17 0937  BP: (!) 106/54  (!) 105/52 (!) 112/55  Pulse: 67  86 68  Resp: 15  16   Temp: 98.2 F (36.8 C)  97.9 F (36.6 C) 98.7 F (37.1 C)  TempSrc: Oral  Oral Oral  SpO2: 100%  100% 100%  Weight:  140 lb 3.4 oz (63.6 kg)    Height:           Intake/Output   Yesterday:  04/12 0701 - 04/13 0700 In: 1772.5 [P.O.:300; I.V.:1412.5; IV Piggyback:60] Out: 651 [Urine:600; Stool:1; Blood:50] This shift:  No intake/output data recorded.  Bowel function:  Flatus: YES  BM:  YES  Drain: (No drain)   Physical Exam:  General: Pt awake/alert/oriented x4 in no acute distress.  tited not toxic Eyes: PERRL, normal EOM.  Sclera clear.  No icterus Neuro: CN II-XII intact w/o focal sensory/motor deficits. Lymph: No head/neck/groin lymphadenopathy Psych:  No delerium/psychosis/paranoia HENT: Normocephalic, Mucus membranes moist.  No thrush Neck: Supple, No tracheal deviation Chest: No chest wall pain  w good excursion CV:  Pulses intact.  Regular rhythm MS: Normal AROM mjr joints.  No obvious deformity  Abdomen: Soft.  Mildy distended.  Mildly tender at incisions only.  No evidence of peritonitis.  No incarcerated hernias.  Ext:  No deformity.  No mjr edema.  No cyanosis Skin:  No petechiae / purpura  Results:   Labs: Results for orders placed or performed during the hospital encounter of 10/25/17 (from the past 48 hour(s))  Type and screen [facility]     Status: None   Collection Time: 10/25/17 10:20 AM  Result Value Ref Range   ABO/RH(D) O POS    Antibody Screen NEG    Sample Expiration      10/28/2017 Performed at [facility], [place] [address], [place], [place] [address]   ABO/Rh     Status: None   Collection Time: 10/25/17 10:20 AM  Result Value Ref Range   ABO/RH(D)      O POS Performed at [facility], [place] [address], [place], [place] [address]   Glucose, capillary     Status: Abnormal   Collection Time: 10/25/17  8:49 PM  Result Value Ref Range   Glucose-Capillary 185 (H) 65 - 99 mg/dL  Basic metabolic panel     Status: Abnormal   Collection Time: 10/26/17  4:12 AM  Result Value Ref Range   Sodium 137 135 - 145 mmol/L   Potassium 4.0 3.5 - 5.1 mmol/L   Chloride 107 101 - 111 mmol/L   CO2 18 (L) 22 - 32 mmol/L   Glucose, Bld 171 (H) 65 - 99 mg/dL   BUN 17 6 - 20 mg/dL   Creatinine, Ser 1.35 (H) 0.44 - 1.00 mg/dL   Calcium 7.8 (L) 8.9 - 10.3 mg/dL   GFR calc non Af Amer 42 (L) >60 mL/min   GFR calc Af Amer 49 (L) >60 mL/min    Comment: (NOTE) The eGFR has been calculated using the CKD EPI equation. This calculation has not been validated in all clinical situations. eGFR's persistently <60 mL/min signify possible Chronic Kidney Disease.    Anion gap 12 5 - 15    Comment: Performed at [facility], [place] [address], [place], [place] [address]  CBC     Status: Abnormal   Collection Time: 10/26/17  4:12 AM  Result Value Ref Range   WBC 13.9 (H) 4.0 - 10.5 K/uL   RBC 2.88 (L) 3.87 - 5.11 MIL/uL   Hemoglobin 9.3 (L) 12.0 - 15.0 g/dL   HCT 28.2 (L) 36.0 - 46.0 %   MCV 97.9 78.0 - 100.0 fL   MCH 32.3 26.0 - 34.0 pg   MCHC 33.0 30.0 - 36.0 g/dL   RDW 13.1 11.5 -  15.5 %   Platelets 255 150 - 400 K/uL    Comment: Performed at [facility], [place] [address], [place], [place] [address]    Imaging / Studies: Dg Chest Port 1 View  Result Date: 10/25/2017 CLINICAL DATA:  Acute respiratory distress. Left colectomy earlier this day. EXAM: PORTABLE CHEST 1 VIEW COMPARISON:  08/21/2014 FINDINGS: Low lung volumes.The cardiomediastinal contours are normal. The lungs are clear. Pulmonary vasculature is normal. No consolidation, pleural effusion, or pneumothorax. No acute osseous abnormalities are seen. IMPRESSION: Low lung volumes without acute abnormality. Electronically Signed   By: Jeb Levering M.D.   On: 10/25/2017 21:35    Medications / Allergies: per chart  Antibiotics: Anti-infectives (  From admission, onward)   Start     Dose/Rate Route Frequency Ordered Stop   10/26/17 0100  cefoTEtan (CEFOTAN) 2 g in sodium chloride 0.9 % 100 mL IVPB     2 g 200 mL/hr over 30 Minutes Intravenous Every 12 hours 10/25/17 1802 10/26/17 0102   10/25/17 2000  valACYclovir (VALTREX) tablet 500 mg     500 mg Oral Daily 10/25/17 1802     10/25/17 0930  clindamycin (CLEOCIN) 900 mg, gentamicin (GARAMYCIN) 240 mg in sodium chloride 0.9 % 1,000 mL for intraperitoneal lavage  Status:  Discontinued      Intraperitoneal To Surgery 10/25/17 0929 10/25/17 1738   10/25/17 0928  cefoTEtan in Dextrose 5% (CEFOTAN) IVPB 2 g     2 g Intravenous On call to O.R. 10/25/17 6761 10/25/17 1348   10/25/17 0928  neomycin (MYCIFRADIN) tablet 1,000 mg  Status:  Discontinued     1,000 mg Oral 3 times per day 10/25/17 0929 10/25/17 1738   10/25/17 0928  metroNIDAZOLE (FLAGYL) tablet 1,000 mg  Status:  Discontinued     1,000 mg Oral 3 times per day 10/25/17 9509 10/25/17 1738        Note: Portions of this report may have been transcribed using voice recognition software. Every effort was made to ensure accuracy; however, inadvertent computerized transcription errors may be  present.   Any transcriptional errors that result from this process are unintentional.     Adin Hector, M.D., F.A.C.S. Gastrointestinal and Minimally Invasive Surgery [facility] [address], [place],  [address] [phone] Main / Paging   10/26/2017

## 2017-10-26 NOTE — Progress Notes (Signed)
Patient's BP was 88/44 and her HR was 51, MD Gross was paged.

## 2017-10-26 NOTE — Progress Notes (Signed)
Rapid response was called because patient was hypotensive and bradycardiac. She also stated that she was having trouble breathing.

## 2017-10-27 DIAGNOSIS — D649 Anemia, unspecified: Secondary | ICD-10-CM

## 2017-10-27 LAB — POTASSIUM: POTASSIUM: 4 mmol/L (ref 3.5–5.1)

## 2017-10-27 LAB — MAGNESIUM: MAGNESIUM: 1.9 mg/dL (ref 1.7–2.4)

## 2017-10-27 LAB — HEMOGLOBIN: HEMOGLOBIN: 7.1 g/dL — AB (ref 12.0–15.0)

## 2017-10-28 ENCOUNTER — Encounter (HOSPITAL_COMMUNITY): Payer: Self-pay | Admitting: Surgery

## 2017-10-28 LAB — MAGNESIUM: MAGNESIUM: 2 mg/dL (ref 1.7–2.4)

## 2017-10-28 LAB — CREATININE, SERUM: CREATININE: 0.8 mg/dL (ref 0.44–1.00)

## 2017-10-28 LAB — POTASSIUM: Potassium: 3.5 mmol/L (ref 3.5–5.1)

## 2017-10-28 LAB — HEMOGLOBIN: Hemoglobin: 6.2 g/dL — CL (ref 12.0–15.0)

## 2017-10-28 MED ORDER — SODIUM CHLORIDE 0.9% FLUSH: 3.0000 mL | INTRAVENOUS | Status: DC | PRN

## 2017-10-28 MED ORDER — LACTATED RINGERS IV BOLUS: 1000.0000 mL | Freq: Three times a day (TID) | INTRAVENOUS | Status: AC | PRN

## 2017-10-28 MED ORDER — FUROSEMIDE 40 MG PO TABS
40.0000 mg | ORAL_TABLET | Freq: Once | ORAL | Status: AC
  Administered 2017-10-28: 40 mg via ORAL
  Filled 2017-10-28: qty 1

## 2017-10-28 MED ORDER — SODIUM CHLORIDE 0.9% FLUSH
3.0000 mL | Freq: Two times a day (BID) | INTRAVENOUS | Status: DC
  Administered 2017-10-28 – 2017-10-31 (×7): 3 mL via INTRAVENOUS

## 2017-10-28 MED ORDER — SODIUM CHLORIDE 0.9 % IV SOLN: 250.0000 mL | INTRAVENOUS | Status: DC | PRN

## 2017-10-28 MED ORDER — PSYLLIUM 95 % PO PACK
1.0000 | PACK | Freq: Two times a day (BID) | ORAL | Status: DC
  Administered 2017-10-28 – 2017-11-01 (×8): 1 via ORAL
  Filled 2017-10-28 (×8): qty 1

## 2017-10-28 NOTE — Progress Notes (Signed)
Received report from Dani GobbleAmanda Lemons.   Patient resting quietly with no complaints.   Agree with previous assessment.

## 2017-10-28 NOTE — Progress Notes (Signed)
[facility]  [address]  [place], [place] [id number] Phone: [phone]  FAX: [phone]      Milinda PointerJill Silguero [id number] 02/13/1961  CARE TEAM:  PCP: [name], [name], MD  Outpatient Care Team: Patient Care Team: [name], [name], MD as PCP - General (Family Medicine) Karie SodaGross, Thersea Manfredonia, MD as Consulting Physician (General Surgery) Huston FoleyAthar, Saima, MD as Attending Physician (Neurology) Dorena CookeyHayes, John, MD (Inactive) as Consulting Physician (Gastroenterology) Vida RiggerMagod, Marc, MD as Consulting Physician (Gastroenterology)  Inpatient Treatment Team: Treatment Team: Attending Provider: Karie SodaGross, Ladonte Verstraete, MD; Registered Nurse: Hilliard ClarkPerez, Adriana, RN; Registered Nurse: Cydney OkWright, Matthew S, RN   Problem List:   Principal Problem:   Diverticulitis of left colon s/p left colectomy 10/25/2017 Active Problems:   Asthma in adult   Acute cholecystitis with chronic cholecystitis s/p lap cholecystectomy 10/25/2017   Postoperative anemia   3 Days Post-Op  10/25/2017  POST-OPERATIVE DIAGNOSIS:    Recurrent descending/sigmoid diverticulitis.   Acute on chronic calculus cholecystitis  PROCEDURE:   XI ROBOTIC ASSISTED RESECTION OF DISTAL DESCENDING & SIGMOID COLON RIGID PROCTOSCOPY ROBOTIC ASSISTED LAPAROSCOPIC CHOLECYSTECTOMY TAP BLOCK - bilateral  SURGEON:  Ardeth SportsmanSteven C. Vanda Waskey, MD  OR FINDINGS:   Patient had somewhat redundant tortuous sigmoid colon with mild but definite thickening at the descending/sigmoid junction.  Resected.  It is a proximal descending colon to rectal end-to-end stapled anastomosis with 33 EEA stapler.  The colorectal anastomosis rests 12 cm from the anal verge by rigid proctoscopy.  Very enlarged gallbladder with moderate omental and transverse colon and duodenal adhesions to it.  Clear colorless "white" bile within it = consistent with acute on chronic cholecystitis.       Assessment  Improving.  Possibly ready for discharge  later today  Plan:  -adv diet -diuresis -follow off IVF w PRN back up - follow BP & UOP closely -low Hgb - BP OK - follow off lovenox.  Check Hgb -improved pain control -VTE prophylaxis- SCDs, etc -mobilize as tolerated to help recovery  D/C patient from hospital when patient meets criteria (anticipate later today vs AM):  Hemoglobin > 7. Tolerating oral intake well Ambulating well Adequate pain control without IV medications Urinating  Having flatus Disposition planning in place   20 minutes spent in review, evaluation, examination, counseling, and coordination of care.  More than 50% of that time was spent in counseling.  Ardeth SportsmanSteven C. Shakemia Madera, M.D., F.A.C.S. Gastrointestinal and Minimally Invasive Surgery [facility] [address] [place], [place] [id number] [phone] Main / Paging   10/28/2017    Subjective: (Chief complaint)  Less sore Feeling better Tol solids PO well Husband in room Walking some  Objective:  Vital signs:  Vitals:   10/27/17 2051 10/27/17 2147 10/28/17 0624 10/28/17 0803  BP:  (!) 114/49 (!) 102/40   Pulse:  80 67   Resp:  18 16   Temp:  99 F (37.2 C) 98.9 F (37.2 C)   TempSrc:  Oral Oral   SpO2: 98% 98% 100% 99%  Weight:   152 lb 5.4 oz (69.1 kg)   Height:        Last BM Date: 10/27/17  Intake/Output   Yesterday:  04/14 0701 - 04/15 0700 In: 440 [P.O.:240; I.V.:200] Out: 650 [Urine:650] This shift:  No intake/output data recorded.  Bowel function:  Flatus: YES  BM:  YES  Drain: (No drain)   Physical Exam:  General: Pt awake/alert/oriented x4 in no acute distress.  tited not toxic Eyes: PERRL,  normal EOM.  Sclera clear.  No icterus Neuro: CN II-XII intact w/o focal sensory/motor deficits. Lymph: No head/neck/groin lymphadenopathy Psych:  No delerium/psychosis/paranoia HENT: Normocephalic, Mucus membranes moist.  No thrush Neck: Supple, No tracheal deviation Chest: No chest  wall pain w good excursion CV:  Pulses intact.  Regular rhythm MS: Normal AROM mjr joints.  No obvious deformity  Abdomen: Soft.  Nondistended.  Nontender.  Dressings and weeks removed.  Some ecchymosis at the suprapubic incision.  No evidence of peritonitis.  No incarcerated hernias.  Ext:  No deformity.  No mjr edema.  No cyanosis Skin: No petechiae / purpura  Results:   Labs: Results for orders placed or performed during the hospital encounter of 10/25/17 (from the past 48 hour(s))  Hemoglobin     Status: Abnormal   Collection Time: 10/27/17  3:55 AM  Result Value Ref Range   Hemoglobin 7.1 (L) 12.0 - 15.0 g/dL    Comment: DELTA CHECK NOTED Performed at [facility], [address] [place], [place] [address]   Potassium     Status: None   Collection Time: 10/27/17  3:55 AM  Result Value Ref Range   Potassium 4.0 3.5 - 5.1 mmol/L    Comment: Performed at [facility], [address], [place], [place] [address]  Magnesium     Status: None   Collection Time: 10/27/17  3:55 AM  Result Value Ref Range   Magnesium 1.9 1.7 - 2.4 mg/dL    Comment: Performed at [facility], [name], [place], [place] [address]    Imaging / Studies: No results found.  Medications / Allergies: per chart  Antibiotics: Anti-infectives (From admission, onward)   Start     Dose/Rate Route Frequency Ordered Stop   10/26/17 0100  cefoTEtan (CEFOTAN) 2 g in sodium chloride 0.9 % 100 mL IVPB     2 g 200 mL/hr over 30 Minutes Intravenous Every 12 hours 10/25/17 1802 10/26/17 0102   10/25/17 2000  valACYclovir (VALTREX) tablet 500 mg     500 mg Oral Daily 10/25/17 1802     10/25/17 0930  clindamycin (CLEOCIN) 900 mg, gentamicin (GARAMYCIN) 240 mg in sodium chloride 0.9 % 1,000 mL for intraperitoneal lavage  Status:  Discontinued      Intraperitoneal To Surgery 10/25/17 0929 10/25/17 1738   10/25/17 0928  cefoTEtan in Dextrose 5% (CEFOTAN)  IVPB 2 g     2 g Intravenous On call to O.R. 10/25/17 9147 10/25/17 1348   10/25/17 0928  neomycin (MYCIFRADIN) tablet 1,000 mg  Status:  Discontinued     1,000 mg Oral 3 times per day 10/25/17 0929 10/25/17 1738   10/25/17 0928  metroNIDAZOLE (FLAGYL) tablet 1,000 mg  Status:  Discontinued     1,000 mg Oral 3 times per day 10/25/17 8295 10/25/17 1738        Note: Portions of this report may have been transcribed using voice recognition software. Every effort was made to ensure accuracy; however, inadvertent computerized transcription errors may be present.   Any transcriptional errors that result from this process are unintentional.     Ardeth Sportsman, M.D., F.A.C.S. Gastrointestinal and Minimally Invasive Surgery [facility] [address] [place], [place] [address] [phone] Main / Paging   10/28/2017

## 2017-10-28 NOTE — Plan of Care (Signed)
Dr. paged to inform of Critical Hemoglobin 6.2

## 2017-10-28 NOTE — Anesthesia Postprocedure Evaluation (Signed)
Anesthesia Post Note  Patient: Tara [name]  Procedure(s) Performed: XI ROBOTIC ASSISTED RESECTION OF SIGMOID COLON ERAS PATHWAY (N/A Abdomen) RIGID PROCTOSCOPY (N/A Rectum) ROBOTIC ASSISTED LAPAROSCOPIC CHOLECYSTECTOMY (N/A Abdomen)     Patient location during evaluation: PACU Anesthesia Type: General Level of consciousness: awake and alert Pain management: pain level controlled Vital Signs Assessment: post-procedure vital signs reviewed and stable Respiratory status: spontaneous breathing, nonlabored ventilation, respiratory function stable and patient connected to nasal cannula oxygen Cardiovascular status: blood pressure returned to baseline and stable Postop Assessment: no apparent nausea or vomiting Anesthetic complications: no    Last Vitals:  Vitals:   10/28/17 1340 10/28/17 1928  BP: (!) 114/47   Pulse: 95   Resp: 17   Temp: 37.3 C   SpO2: 98% 100%    Last Pain:  Vitals:   10/28/17 1651  TempSrc:   PainSc: 3                  Ryan P Ellender

## 2017-10-28 NOTE — Discharge Summary (Addendum)
Physician Discharge Summary  Patient ID: Tara [name] MRN: [id number] DOB/AGE: 58-Oct-1962  58 y.o.  Admit date: 10/25/2017 Discharge date: 10/31/2017   Patient Care Team: Carol Ada, MD as PCP - General (Family Medicine) Michael Boston, MD as Consulting Physician (General Surgery) Star Age, MD as Attending Physician (Neurology) Teena Irani, MD (Inactive) as Consulting Physician (Gastroenterology) Clarene Essex, MD as Consulting Physician (Gastroenterology)  Discharge Diagnoses:  Principal Problem:   Diverticulitis of left colon s/p left colectomy 10/25/2017 Active Problems:   Asthma in adult   Acute cholecystitis with chronic cholecystitis s/p lap cholecystectomy 10/25/2017   Postoperative anemia   3 Days Post-Op  10/25/2017  POST-OPERATIVE DIAGNOSIS:    Recurrent descending/sigmoid diverticulitis.   Acute on chronic calculus cholecystitis  PROCEDURE:   XI ROBOTIC ASSISTED RESECTION OF DISTAL DESCENDING & SIGMOID COLON RIGID PROCTOSCOPY ROBOTIC ASSISTED LAPAROSCOPIC CHOLECYSTECTOMY TAP BLOCK - bilateral  SURGEON:  Adin Hector, MD    Consults: None  Hospital Course:   The patient underwent the surgery above.  Postoperatively, the patient gradually mobilized and advanced to a solid diet.  Pain and other symptoms were treated aggressively.    By the time of discharge, the patient was walking well the hallways, eating food, having flatus.  Low Hgb but stable and asymptomatic.  Pain was well-controlled on IV medication.  Tramadol too weak & intolerance to most other PO meds w itching = PO Dilaudid given.  Based on meeting discharge criteria and continuing to recover, I felt it was safe for the patient to be discharged from the hospital to further recover with close followup. Postoperative recommendations were discussed in detail.  They are written as well.  Discharged Condition: Improving  Disposition:  Follow-up Information    Michael Boston, MD. Schedule an  appointment as soon as possible for a visit in 2 weeks.   Specialty:  General Surgery Why:  To follow up after your operation, To follow up after your hospital stay Contact information: [place] [address] [phone]           Discharge disposition: 01-Home or Self Care       Discharge Instructions    Call MD for:   Complete by:  As directed    FEVER > 101.5 F  (temperatures < 101.5 F are not significant)   Call MD for:   Complete by:  As directed    FEVER > 101.5 F  (temperatures < 101.5 F are not significant)   Call MD for:  extreme fatigue   Complete by:  As directed    Call MD for:  extreme fatigue   Complete by:  As directed    Call MD for:  persistant dizziness or light-headedness   Complete by:  As directed    Call MD for:  persistant dizziness or light-headedness   Complete by:  As directed    Call MD for:  persistant nausea and vomiting   Complete by:  As directed    Call MD for:  persistant nausea and vomiting   Complete by:  As directed    Call MD for:  redness, tenderness, or signs of infection (pain, swelling, redness, odor or green/yellow discharge around incision site)   Complete by:  As directed    Call MD for:  redness, tenderness, or signs of infection (pain, swelling, redness, odor or green/yellow discharge around incision site)   Complete by:  As directed    Call MD for:  severe uncontrolled pain  Complete by:  As directed    Call MD for:  severe uncontrolled pain   Complete by:  As directed    Diet - low sodium heart healthy   Complete by:  As directed    Start with a bland diet such as soups, liquids, starchy foods, low fat foods, etc. the first few days at home. Gradually advance to a solid, low-fat, high fiber diet by the end of the first week at home.   Add a fiber supplement to your diet (Metamucil, etc) If you feel full, bloated, or constipated, stay on a full liquid or pureed/blenderized diet for a few  days until you feel better and are no longer constipated.   Diet - low sodium heart healthy   Complete by:  As directed    Start with a bland diet such as soups, liquids, starchy foods, low fat foods, etc. the first few days at home. Gradually advance to a solid, low-fat, high fiber diet by the end of the first week at home.   Add a fiber supplement to your diet (Metamucil, etc) If you feel full, bloated, or constipated, stay on a full liquid or pureed/blenderized diet for a few days until you feel better and are no longer constipated.   Discharge instructions   Complete by:  As directed    See Discharge Instructions If you are not getting better after two weeks or are noticing you are getting worse, contact our office [phone] for further advice.  We may need to adjust your medications, re-evaluate you in the office, send you to the emergency room, or see what other things we can do to help. The clinic staff is available to answer your questions during regular business hours (8:30am-5pm).  Please don't hesitate to call and ask to speak to one of our nurses for clinical concerns.    A surgeon from [facility] is always on call at the hospitals 24 hours/day If you have a medical emergency, go to the nearest emergency room or call 911.   Discharge instructions   Complete by:  As directed    See Discharge Instructions If you are not getting better after two weeks or are noticing you are getting worse, contact our office [phone] for further advice.  We may need to adjust your medications, re-evaluate you in the office, send you to the emergency room, or see what other things we can do to help. The clinic staff is available to answer your questions during regular business hours (8:30am-5pm).  Please don't hesitate to call and ask to speak to one of our nurses for clinical concerns.    A surgeon from [facility] is always on call at the hospitals 24 hours/day If  you have a medical emergency, go to the nearest emergency room or call 911.   Discharge wound care:   Complete by:  As directed    It is good for closed incision and even open wounds to be washed every day.  Shower every day.  Short baths are fine.  Wash the incisions and wounds clean with soap & water.    If you have a closed incision(s), wash the incision with soap & water every day.  You may leave closed incisions open to air if it is dry.   You may cover the incision with clean gauze & replace it after your daily shower for comfort. If you have skin tapes (Steristrips) or skin glue (Dermabond) on your incision,  leave them in place.  They will fall off on their own like a scab.  You may trim any edges that curl up with clean scissors.  If you have staples, set up an appointment for them to be removed in the office in 10 days after surgery.  If you have a drain, wash around the skin exit site with soap & water and place a new dressing of gauze or band aid around the skin every day.  Keep the drain site clean & dry.   Discharge wound care:   Complete by:  As directed    It is good for closed incision and even open wounds to be washed every day.  Shower every day.  Short baths are fine.  Wash the incisions and wounds clean with soap & water.    If you have a closed incision(s), wash the incision with soap & water every day.  You may leave closed incisions open to air if it is dry.   You may cover the incision with clean gauze & replace it after your daily shower for comfort. If you have skin tapes (Steristrips) or skin glue (Dermabond) on your incision, leave them in place.  They will fall off on their own like a scab.  You may trim any edges that curl up with clean scissors.  If you have staples, set up an appointment for them to be removed in the office in 10 days after surgery.  If you have a drain, wash around the skin exit site with soap & water and place a new dressing of gauze or band aid around  the skin every day.  Keep the drain site clean & dry.   Driving Restrictions   Complete by:  As directed    You may drive when: - you are no longer taking narcotic prescription pain medication - you can comfortably wear a seatbelt - you can safely make sudden turns/stops without pain.   Driving Restrictions   Complete by:  As directed    You may drive when: - you are no longer taking narcotic prescription pain medication - you can comfortably wear a seatbelt - you can safely make sudden turns/stops without pain.   Increase activity slowly   Complete by:  As directed    Start light daily activities --- self-care, walking, climbing stairs- beginning the day after surgery.  Gradually increase activities as tolerated.  Control your pain to be active.  Stop when you are tired.  Ideally, walk several times a day, eventually an hour a day.   Most people are back to most day-to-day activities in a few weeks.  It takes 4-6 weeks to get back to unrestricted, intense activity. If you can walk 30 minutes without difficulty, it is safe to try more intense activity such as jogging, treadmill, bicycling, low-impact aerobics, swimming, etc. Save the most intensive and strenuous activity for last (Usually 4-8 weeks after surgery) such as sit-ups, heavy lifting, contact sports, etc.  Refrain from any intense heavy lifting or straining until you are off narcotics for pain control.  You will have off days, but things should improve week-by-week. DO NOT PUSH THROUGH PAIN.  Let pain be your guide: If it hurts to do something, don't do it.   Increase activity slowly   Complete by:  As directed    Start light daily activities --- self-care, walking, climbing stairs- beginning the day after surgery.  Gradually increase activities as tolerated.  Control your pain to be  active.  Stop when you are tired.  Ideally, walk several times a day, eventually an hour a day.   Most people are back to most day-to-day activities in  a few weeks.  It takes 4-6 weeks to get back to unrestricted, intense activity. If you can walk 30 minutes without difficulty, it is safe to try more intense activity such as jogging, treadmill, bicycling, low-impact aerobics, swimming, etc. Save the most intensive and strenuous activity for last (Usually 4-8 weeks after surgery) such as sit-ups, heavy lifting, contact sports, etc.  Refrain from any intense heavy lifting or straining until you are off narcotics for pain control.  You will have off days, but things should improve week-by-week. DO NOT PUSH THROUGH PAIN.  Let pain be your guide: If it hurts to do something, don't do it.   Lifting restrictions   Complete by:  As directed    If you can walk 30 minutes without difficulty, it is safe to try more intense activity such as jogging, treadmill, bicycling, low-impact aerobics, swimming, etc. Save the most intensive and strenuous activity for last (Usually 4-8 weeks after surgery) such as sit-ups, heavy lifting, contact sports, etc.   Refrain from any intense heavy lifting or straining until you are off narcotics for pain control.  You will have off days, but things should improve week-by-week. DO NOT PUSH THROUGH PAIN.  Let pain be your guide: If it hurts to do something, don't do it.  Pain is your body warning you to avoid that activity for another week until the pain goes down.   Lifting restrictions   Complete by:  As directed    If you can walk 30 minutes without difficulty, it is safe to try more intense activity such as jogging, treadmill, bicycling, low-impact aerobics, swimming, etc. Save the most intensive and strenuous activity for last (Usually 4-8 weeks after surgery) such as sit-ups, heavy lifting, contact sports, etc.   Refrain from any intense heavy lifting or straining until you are off narcotics for pain control.  You will have off days, but things should improve week-by-week. DO NOT PUSH THROUGH PAIN.  Let pain be your guide: If  it hurts to do something, don't do it.  Pain is your body warning you to avoid that activity for another week until the pain goes down.   May shower / Bathe   Complete by:  As directed    May shower / Bathe   Complete by:  As directed    May walk up steps   Complete by:  As directed    May walk up steps   Complete by:  As directed    Sexual Activity Restrictions   Complete by:  As directed    You may have sexual intercourse when it is comfortable. If it hurts to do something, stop.   Sexual Activity Restrictions   Complete by:  As directed    You may have sexual intercourse when it is comfortable. If it hurts to do something, stop.      Allergies as of 10/29/2017      Reactions   Ciprofloxacin Hcl Rash   Codeine Itching   HYDROCODONE AND OXYCODONE ALSO   Hydrocodone Itching   Tolerates Dilaudid fine   Oxycodone Itching   Tolerates Dilaudid fine      Medication List    TAKE these medications   CALTRATE 600+D 600-400 MG-UNIT tablet Generic drug:  Calcium Carbonate-Vitamin D Take 1 tablet by mouth daily.  DULERA 100-5 MCG/ACT Aero Generic drug:  mometasone-formoterol Inhale 2 puffs into the lungs 2 (two) times daily.   ferrous sulfate 325 (65 FE) MG tablet Take 1 tablet (325 mg total) by mouth 2 (two) times daily with a meal.   HYDROmorphone 2 MG tablet Commonly known as:  DILAUDID Take 1-2 tablets (2-4 mg total) by mouth every 6 (six) hours as needed for severe pain.   metroNIDAZOLE 0.75 % gel Commonly known as:  METROGEL Apply 1 application topically daily as needed.   mometasone 50 MCG/ACT nasal spray Commonly known as:  NASONEX Place 1 spray into the nose daily as needed.   MULTIVITAMIN ADULT PO Take 1 tablet by mouth daily.   OTEZLA 30 MG Tabs Generic drug:  Apremilast Take 1 tablet by mouth 2 (two) times daily.   polyethylene glycol packet Commonly known as:  MIRALAX / GLYCOLAX Take 17 g by mouth 2 (two) times daily.   SINGULAIR 10 MG  tablet Generic drug:  montelukast Take 1 tablet by mouth at bedtime.   valACYclovir 500 MG tablet Commonly known as:  VALTREX Take 500 mg by mouth daily.            Discharge Care Instructions  (From admission, onward)        Start     Ordered   10/28/17 0000  Discharge wound care:    Comments:  It is good for closed incision and even open wounds to be washed every day.  Shower every day.  Short baths are fine.  Wash the incisions and wounds clean with soap & water.    If you have a closed incision(s), wash the incision with soap & water every day.  You may leave closed incisions open to air if it is dry.   You may cover the incision with clean gauze & replace it after your daily shower for comfort. If you have skin tapes (Steristrips) or skin glue (Dermabond) on your incision, leave them in place.  They will fall off on their own like a scab.  You may trim any edges that curl up with clean scissors.  If you have staples, set up an appointment for them to be removed in the office in 10 days after surgery.  If you have a drain, wash around the skin exit site with soap & water and place a new dressing of gauze or band aid around the skin every day.  Keep the drain site clean & dry.   10/28/17 0848   10/25/17 0000  Discharge wound care:    Comments:  It is good for closed incision and even open wounds to be washed every day.  Shower every day.  Short baths are fine.  Wash the incisions and wounds clean with soap & water.    If you have a closed incision(s), wash the incision with soap & water every day.  You may leave closed incisions open to air if it is dry.   You may cover the incision with clean gauze & replace it after your daily shower for comfort. If you have skin tapes (Steristrips) or skin glue (Dermabond) on your incision, leave them in place.  They will fall off on their own like a scab.  You may trim any edges that curl up with clean scissors.  If you have staples, set up an  appointment for them to be removed in the office in 10 days after surgery.  If you have a drain, wash around the skin  exit site with soap & water and place a new dressing of gauze or band aid around the skin every day.  Keep the drain site clean & dry.   10/25/17 1159      Significant Diagnostic Studies:  Results for orders placed or performed during the hospital encounter of 10/25/17 (from the past 72 hour(s))  Type and screen [place]     Status: None   Collection Time: 10/25/17 10:20 AM  Result Value Ref Range   ABO/RH(D) O POS    Antibody Screen NEG    Sample Expiration      10/28/2017 Performed at [facility], [place] [address], [place], [place] [address]   ABO/Rh     Status: None   Collection Time: 10/25/17 10:20 AM  Result Value Ref Range   ABO/RH(D)      O POS Performed at [facility], [place] [address], [place], [place] [address]   Glucose, capillary     Status: Abnormal   Collection Time: 10/25/17  8:49 PM  Result Value Ref Range   Glucose-Capillary 185 (H) 65 - 99 mg/dL  Basic metabolic panel     Status: Abnormal   Collection Time: 10/26/17  4:12 AM  Result Value Ref Range   Sodium 137 135 - 145 mmol/L   Potassium 4.0 3.5 - 5.1 mmol/L   Chloride 107 101 - 111 mmol/L   CO2 18 (L) 22 - 32 mmol/L   Glucose, Bld 171 (H) 65 - 99 mg/dL   BUN 17 6 - 20 mg/dL   Creatinine, Ser 1.35 (H) 0.44 - 1.00 mg/dL   Calcium 7.8 (L) 8.9 - 10.3 mg/dL   GFR calc non Af Amer 42 (L) >60 mL/min   GFR calc Af Amer 49 (L) >60 mL/min    Comment: (NOTE) The eGFR has been calculated using the CKD EPI equation. This calculation has not been validated in all clinical situations. eGFR's persistently <60 mL/min signify possible Chronic Kidney Disease.    Anion gap 12 5 - 15    Comment: Performed at [facility], [place] [address] [place], [place] [address]  CBC     Status: Abnormal   Collection Time: 10/26/17  4:12  AM  Result Value Ref Range   WBC 13.9 (H) 4.0 - 10.5 K/uL   RBC 2.88 (L) 3.87 - 5.11 MIL/uL   Hemoglobin 9.3 (L) 12.0 - 15.0 g/dL   HCT 28.2 (L) 36.0 - 46.0 %   MCV 97.9 78.0 - 100.0 fL   MCH 32.3 26.0 - 34.0 pg   MCHC 33.0 30.0 - 36.0 g/dL   RDW 13.1 11.5 - 15.5 %   Platelets 255 150 - 400 K/uL    Comment: Performed at [facility], [place] [address], [place], [place] [address]  Hemoglobin     Status: Abnormal   Collection Time: 10/27/17  3:55 AM  Result Value Ref Range   Hemoglobin 7.1 (L) 12.0 - 15.0 g/dL    Comment: DELTA CHECK NOTED Performed at [address], [place], [place] [address]   Potassium     Status: None   Collection Time: 10/27/17  3:55 AM  Result Value Ref Range   Potassium 4.0 3.5 - 5.1 mmol/L    Comment: Performed at [facility], [place] [address], [place], [place] [address]  Magnesium     Status: None   Collection Time: 10/27/17  3:55 AM  Result  Value Ref Range   Magnesium 1.9 1.7 - 2.4 mg/dL    Comment: Performed at [facility], [place] [address], [place], [place] [address]    Dg Chest Port 1 View  Result Date: 10/25/2017 CLINICAL DATA:  Acute respiratory distress. Left colectomy earlier this day. EXAM: PORTABLE CHEST 1 VIEW COMPARISON:  08/21/2014 FINDINGS: Low lung volumes.The cardiomediastinal contours are normal. The lungs are clear. Pulmonary vasculature is normal. No consolidation, pleural effusion, or pneumothorax. No acute osseous abnormalities are seen. IMPRESSION: Low lung volumes without acute abnormality. Electronically Signed   By: Jeb Levering M.D.   On: 10/25/2017 21:35    Discharge Exam: Blood pressure (!) 102/40, pulse 67, temperature 98.9 F (37.2 C), temperature source Oral, resp. rate 16, height '5\' 7"'  (1.702 m), weight 152 lb 5.4 oz (69.1 kg), last menstrual period 08/30/2010, SpO2 99 %.  General: Pt awake/alert/oriented x4 in No acute  distress Eyes: PERRL, normal EOM.  Sclera clear.  No icterus Neuro: CN II-XII intact w/o focal sensory/motor deficits. Lymph: No head/neck/groin lymphadenopathy Psych:  No delerium/psychosis/paranoia HENT: Normocephalic, Mucus membranes moist.  No thrush Neck: Supple, No tracheal deviation Chest: No chest wall pain w good excursion CV:  Pulses intact.  Regular rhythm MS: Normal AROM mjr joints.  No obvious deformity Abdomen: Soft.  Nondistended.  Nontender.  Stable ecchymosis near suprapubic Pfannenstiel incision & flank - resolving.  No incisional hematoma.  No seroma.  No cellulitis.  No hernia.  No evidence of peritonitis.  No incarcerated hernias. Ext:  SCDs BLE.  No mjr edema.  No cyanosis Skin: No petechiae / purpura  Past Medical History:  Diagnosis Date  . Abnormal urinalysis 01/24/2016  . Acute cystitis without hematuria   . ADD (attention deficit disorder)   . Asthma   . Chest pain 01/25/2016  . Depression   . Diverticulitis   . Left upper extremity swelling   . MDD (major depressive disorder), recurrent severe, without psychosis ([place]) 09/30/2015  . Memory changes 01/18/2016  . Psoriasis   . S/P right unicompartmental knee replacement 05/07/2017    Past Surgical History:  Procedure Laterality Date  . APPENDECTOMY    . KNEE SURGERY Right     Social History   Socioeconomic History  . Marital status: Legally Separated    Spouse name: Not on file  . Number of children: 4  . Years of education: college  . Highest education level: Not on file  Occupational History  . Occupation: Museum/gallery conservator   Social Needs  . Financial resource strain: Not on file  . Food insecurity:    Worry: Not on file    Inability: Not on file  . Transportation needs:    Medical: Not on file    Non-medical: Not on file  Tobacco Use  . Smoking status: Never Smoker  . Smokeless tobacco: Never Used  Substance and Sexual Activity  . Alcohol use: Yes    Alcohol/week: 0.0 oz    Comment:  occasionally  . Drug use: No  . Sexual activity: Yes  Lifestyle  . Physical activity:    Days per week: Not on file    Minutes per session: Not on file  . Stress: Not on file  Relationships  . Social connections:    Talks on phone: Not on file    Gets together: Not on file    Attends religious service: Not on file    Active member of club or organization: Not on file  Attends meetings of clubs or organizations: Not on file    Relationship status: Not on file  . Intimate partner violence:    Fear of current or ex partner: Not on file    Emotionally abused: Not on file    Physically abused: Not on file    Forced sexual activity: Not on file  Other Topics Concern  . Not on file  Social History Narrative   Rare caffeine use     Family History  Problem Relation Age of Onset  . COPD Mother   . Hypertension Mother   . Heart failure Father   . Hypertension Father   . Hypertension Sister   . Heart attack Brother   . Hypertension Brother   . Diabetes Brother   . Diabetes Maternal Grandmother   . Breast cancer Maternal Aunt   . Breast cancer Cousin   . Dementia Neg Hx     Current Facility-Administered Medications  Medication Dose Route Frequency Provider Last Rate Last Dose  . 0.9 %  sodium chloride infusion  250 mL Intravenous PRN Michael Boston, MD      . acetaminophen (TYLENOL) tablet 1,000 mg  1,000 mg Oral Lajuana Ripple, MD   1,000 mg at 10/28/17 0552  . alum & mag hydroxide-simeth (MAALOX/MYLANTA) 200-200-20 MG/5ML suspension 30 mL  30 mL Oral Q6H PRN Michael Boston, MD   30 mL at 10/27/17 1608  . Apremilast TABS 1 tablet  1 tablet Oral BID Michael Boston, MD   1 tablet at 10/27/17 2201  . bupivacaine liposome (EXPAREL) 1.3 % injection 266 mg  20 mL Infiltration Once Michael Boston, MD      . calcium-vitamin D (OSCAL WITH D) 500-200 MG-UNIT per tablet 1 tablet  1 tablet Oral Daily Michael Boston, MD   1 tablet at 10/27/17 1003  . diphenhydrAMINE (BENADRYL) 12.5 MG/5ML  elixir 12.5 mg  12.5 mg Oral Q6H PRN Michael Boston, MD   12.5 mg at 10/27/17 1632   Or  . diphenhydrAMINE (BENADRYL) injection 12.5 mg  12.5 mg Intravenous Q6H PRN Michael Boston, MD      . feeding supplement (ENSURE SURGERY) liquid 237 mL  237 mL Oral BID BM Michael Boston, MD   237 mL at 10/27/17 1430  . fluticasone (FLONASE) 50 MCG/ACT nasal spray 2 spray  2 spray Each Nare Daily Michael Boston, MD   2 spray at 10/27/17 1004  . furosemide (LASIX) tablet 40 mg  40 mg Oral Once Michael Boston, MD      . gabapentin (NEURONTIN) capsule 300 mg  300 mg Oral BID Michael Boston, MD   300 mg at 10/27/17 2201  . hydrALAZINE (APRESOLINE) injection 10 mg  10 mg Intravenous Q2H PRN Michael Boston, MD      . HYDROmorphone (DILAUDID) injection 0.5-2 mg  0.5-2 mg Intravenous Q2H PRN Michael Boston, MD   1 mg at 10/28/17 0036  . lactated ringers bolus 1,000 mL  1,000 mL Intravenous Q8H PRN Michael Boston, MD      . lip balm (CARMEX) ointment 1 application  1 application Topical BID Michael Boston, MD   1 application at 36/62/94 2155  . magic mouthwash  15 mL Oral QID PRN Michael Boston, MD      . methocarbamol (ROBAXIN) 1,000 mg in dextrose 5 % 50 mL IVPB  1,000 mg Intravenous Q6H PRN Michael Boston, MD 120 mL/hr at 10/25/17 1702 1,000 mg at 10/25/17 1702  . metoCLOPramide (REGLAN) injection 10 mg  10 mg Intravenous  Q6H PRN Michael Boston, MD   10 mg at 10/25/17 1832  . metoCLOPramide (REGLAN) tablet 10 mg  10 mg Oral Q6H PRN Michael Boston, MD      . metoprolol tartrate (LOPRESSOR) injection 5 mg  5 mg Intravenous Q6H PRN Michael Boston, MD      . metroNIDAZOLE (METROGEL) 8.11 % gel 1 application  1 application Topical Daily PRN Michael Boston, MD      . mometasone-formoterol [facility]) 100-5 MCG/ACT inhaler 2 puff  2 puff Inhalation BID Michael Boston, MD   2 puff at 10/28/17 0803  . montelukast (SINGULAIR) tablet 10 mg  10 mg Oral Ardeen Fillers, MD   10 mg at 10/27/17 2201  . ondansetron (ZOFRAN) tablet 4 mg  4 mg Oral  Q6H PRN Michael Boston, MD       Or  . ondansetron [facility]) injection 4 mg  4 mg Intravenous Q6H PRN Michael Boston, MD      . prochlorperazine (COMPAZINE) tablet 10 mg  10 mg Oral Q6H PRN Michael Boston, MD       Or  . prochlorperazine (COMPAZINE) injection 5-10 mg  5-10 mg Intravenous Q6H PRN Michael Boston, MD      . saccharomyces boulardii (FLORASTOR) capsule 250 mg  250 mg Oral BID Michael Boston, MD   250 mg at 10/27/17 2201  . sodium chloride 0.9 % bolus 500 mL  500 mL Intravenous Once PRN Michael Boston, MD      . sodium chloride flush (NS) 0.9 % injection 3 mL  3 mL Intravenous Gorden Harms, MD      . sodium chloride flush (NS) 0.9 % injection 3 mL  3 mL Intravenous PRN Michael Boston, MD      . traMADol [facility]at[facility] Bourbon) tablet 50-100 mg  50-100 mg Oral Q6H PRN Michael Boston, MD   50 mg at 10/27/17 2037  . valACYclovir (VALTREX) tablet 500 mg  500 mg Oral Daily Michael Boston, MD   500 mg at 10/27/17 1003   Facility-Administered Medications Ordered in Other Encounters  Medication Dose Route Frequency Provider Last Rate Last Dose  . gadopentetate dimeglumine (MAGNEVIST) injection 13 mL  13 mL Intravenous Once PRN Melvenia Beam, MD         Allergies  Allergen Reactions  . Ciprofloxacin Hcl Rash  . Codeine Itching    HYDROCODONE AND OXYCODONE ALSO    Signed: Morton Peters, M.D., F.A.C.S. Gastrointestinal and Minimally Invasive Surgery [facility] [address] [place], [place] [address] [phone] Main / Paging   10/28/2017, 8:48 AM

## 2017-10-29 LAB — POTASSIUM: POTASSIUM: 3.4 mmol/L — AB (ref 3.5–5.1)

## 2017-10-29 LAB — PREPARE RBC (CROSSMATCH)

## 2017-10-29 LAB — GLUCOSE, CAPILLARY: Glucose-Capillary: 121 mg/dL — ABNORMAL HIGH (ref 65–99)

## 2017-10-29 LAB — MAGNESIUM: MAGNESIUM: 1.8 mg/dL (ref 1.7–2.4)

## 2017-10-29 LAB — HEMOGLOBIN: Hemoglobin: 6.3 g/dL — CL (ref 12.0–15.0)

## 2017-10-29 MED ORDER — LACTATED RINGERS IV BOLUS: 1000.0000 mL | Freq: Three times a day (TID) | INTRAVENOUS | Status: AC | PRN

## 2017-10-29 MED ORDER — OXYCODONE HCL 5 MG PO TABS: 5.0000 mg | ORAL_TABLET | ORAL | Status: DC | PRN

## 2017-10-29 MED ORDER — LACTATED RINGERS IV BOLUS
1000.0000 mL | Freq: Once | INTRAVENOUS | Status: AC
  Administered 2017-10-29: 1000 mL via INTRAVENOUS

## 2017-10-29 MED ORDER — SODIUM CHLORIDE 0.9 % IV BOLUS: 500.0000 mL | Freq: Once | INTRAVENOUS | Status: DC | PRN

## 2017-10-29 MED ORDER — FERROUS SULFATE 325 (65 FE) MG PO TABS: 325.0000 mg | ORAL_TABLET | Freq: Two times a day (BID) | ORAL | 1 refills | Status: DC

## 2017-10-29 MED ORDER — HYDROMORPHONE HCL 2 MG PO TABS: 2.0000 mg | ORAL_TABLET | ORAL | Status: DC | PRN

## 2017-10-29 MED ORDER — FERROUS SULFATE 325 (65 FE) MG PO TABS
325.0000 mg | ORAL_TABLET | Freq: Two times a day (BID) | ORAL | Status: DC
Start: 2017-10-29 — End: 2017-11-01
  Administered 2017-10-29 – 2017-11-01 (×7): 325 mg via ORAL
  Filled 2017-10-29 (×7): qty 1

## 2017-10-29 MED ORDER — SODIUM CHLORIDE 0.9 % IV SOLN
Freq: Once | INTRAVENOUS | Status: AC
  Administered 2017-10-29: 16:00:00 via INTRAVENOUS

## 2017-10-29 MED ORDER — HYDROMORPHONE HCL 2 MG PO TABS
2.0000 mg | ORAL_TABLET | Freq: Four times a day (QID) | ORAL | Status: DC | PRN
  Administered 2017-10-29 – 2017-10-30 (×2): 2 mg via ORAL
  Filled 2017-10-29 (×4): qty 1

## 2017-10-29 MED ORDER — IBUPROFEN 200 MG PO TABS
400.0000 mg | ORAL_TABLET | Freq: Four times a day (QID) | ORAL | Status: AC | PRN
  Administered 2017-10-30 – 2017-10-31 (×2): 400 mg via ORAL
  Filled 2017-10-29 (×2): qty 2

## 2017-10-29 MED ORDER — HYDROMORPHONE HCL 2 MG PO TABS: 2.0000 mg | ORAL_TABLET | Freq: Four times a day (QID) | ORAL | 0 refills | Status: DC | PRN

## 2017-10-29 NOTE — Progress Notes (Signed)
Note: Portions of this report may have been transcribed using voice recognition software. A sincere effort was made to ensure accuracy; however, inadvertent computerized transcription errors may be present.   Any transcriptional errors that result from this process are unintentional.        Tara [name]  08/09/1960 [id number]  Patient Care Team: [name], [name], MD as PCP - General (Family Medicine) Karie SodaGross, Kaveon Blatz, MD as Consulting Physician (General Surgery) Huston FoleyAthar, Saima, MD as Attending Physician (Neurology) Dorena CookeyHayes, John, MD (Inactive) as Consulting Physician (Gastroenterology) Vida RiggerMagod, Marc, MD as Consulting Physician (Gastroenterology)   Patient tired but not nauseated.  Husband in room.  Feeling better after IV fluids.  Discussed with nurse.  Blood pending.  Transfuse this afternoon.  General: Pt awake/alert/oriented x4 in no major acute distress Eyes: PERRL, normal EOM. Sclera nonicteric Neuro: CN II-XII intact w/o focal sensory/motor deficits. Lymph: No head/neck/groin lymphadenopathy Psych:  No delerium/psychosis/paranoia.  A little sleepy but fully oriented.  No focal deficits.  No confusion. HENT: Normocephalic, Mucus membranes moist.  No thrush Neck: Supple, No tracheal deviation Chest: No pain.  Good respiratory excursion. CV:  Pulses intact.  Regular rhythm MS: Normal AROM mjr joints.  No obvious deformity Abdomen: Soft, Nondistended.  Nontender.  No incarcerated hernias. Ext:  SCDs BLE.  No significant edema.  No cyanosis Skin: No petechiae / purpura  Ardeth SportsmanSteven C. Gaynell Eggleton, M.D., F.A.C.S. Gastrointestinal and Minimally Invasive Surgery [facility] [address] [place], [place] [address] [phone] Main / Paging     Patient Active Problem List   Diagnosis Date Noted  . Postoperative anemia 10/27/2017  . Diverticulitis of left colon s/p left colectomy 10/25/2017 10/25/2017  . Acute cholecystitis with chronic cholecystitis s/p  lap cholecystectomy 10/25/2017 10/25/2017  . Patellofemoral pain syndrome of right knee 05/07/2017  . Asthma in adult 01/24/2016  . Gastroesophageal reflux disease without esophagitis   . Insomnia   . Generalized anxiety disorder   . MDD (major depressive disorder), recurrent severe, without psychosis (HCC) 09/30/2015  . Bilateral carpal tunnel syndrome 03/15/2015    Past Medical History:  Diagnosis Date  . Abnormal urinalysis 01/24/2016  . Acute cystitis without hematuria   . ADD (attention deficit disorder)   . Asthma   . Chest pain 01/25/2016  . Depression   . Diverticulitis   . Left upper extremity swelling   . MDD (major depressive disorder), recurrent severe, without psychosis (HCC) 09/30/2015  . Memory changes 01/18/2016  . Psoriasis   . S/P right unicompartmental knee replacement 05/07/2017    Past Surgical History:  Procedure Laterality Date  . APPENDECTOMY    . CHOLECYSTECTOMY N/A 10/25/2017   Procedure: ROBOTIC ASSISTED LAPAROSCOPIC CHOLECYSTECTOMY;  Surgeon: Karie SodaGross, Mercedies Ganesh, MD;  Location: [facility];  Service: General;  Laterality: N/A;  . KNEE SURGERY Right   . PROCTOSCOPY N/A 10/25/2017   Procedure: RIGID PROCTOSCOPY;  Surgeon: Karie SodaGross, Latecia Miler, MD;  Location: [facility];  Service: General;  Laterality: N/A;  . ROBOT ASSISTED LAPAROSCOPIC PARTIAL COLECTOMY  10/25/2017   descending/sigmoid colon resection for recurrent diverticulitis.  Dr. Michaell CowingGross  . ROBOTIC ASSISTED LAPAROSCOPIC CHOLECYSTECTOMY  10/25/2017   For acute on chronic cholecystitis.  Dr. Michaell CowingGross    Social History   Socioeconomic History  . Marital status: Legally Separated    Spouse name: Not on file  . Number of children: 4  . Years of education: college  . Highest education level: Not on file  Occupational History  . Occupation: [employer]   Social  Needs  . Financial resource strain: Not on file  . Food insecurity:    Worry: Not on file    Inability: Not on file  . Transportation needs:    Medical: Not on  file    Non-medical: Not on file  Tobacco Use  . Smoking status: Never Smoker  . Smokeless tobacco: Never Used  Substance and Sexual Activity  . Alcohol use: Yes    Alcohol/week: 0.0 oz    Comment: occasionally  . Drug use: No  . Sexual activity: Yes  Lifestyle  . Physical activity:    Days per week: Not on file    Minutes per session: Not on file  . Stress: Not on file  Relationships  . Social connections:    Talks on phone: Not on file    Gets together: Not on file    Attends religious service: Not on file    Active member of club or organization: Not on file    Attends meetings of clubs or organizations: Not on file    Relationship status: Not on file  . Intimate partner violence:    Fear of current or ex partner: Not on file    Emotionally abused: Not on file    Physically abused: Not on file    Forced sexual activity: Not on file  Other Topics Concern  . Not on file  Social History Narrative   Rare caffeine use     Family History  Problem Relation Age of Onset  . COPD Mother   . Hypertension Mother   . Heart failure Father   . Hypertension Father   . Hypertension Sister   . Heart attack Brother   . Hypertension Brother   . Diabetes Brother   . Diabetes Maternal Grandmother   . Breast cancer Maternal Aunt   . Breast cancer Cousin   . Dementia Neg Hx     Current Facility-Administered Medications  Medication Dose Route Frequency Provider Last Rate Last Dose  . 0.9 %  sodium chloride infusion  250 mL Intravenous PRN Karie Soda, MD      . 0.9 %  sodium chloride infusion   Intravenous Once Karie Soda, MD      . acetaminophen (TYLENOL) tablet 1,000 mg  1,000 mg Oral Trecia Rogers, MD   1,000 mg at 10/29/17 0646  . alum & mag hydroxide-simeth (MAALOX/MYLANTA) 200-200-20 MG/5ML suspension 30 mL  30 mL Oral Q6H PRN Karie Soda, MD   30 mL at 10/27/17 1608  . Apremilast TABS 1 tablet  1 tablet Oral BID Karie Soda, MD   1 tablet at 10/28/17 2119  .  bupivacaine liposome (EXPAREL) 1.3 % injection 266 mg  20 mL Infiltration Once Karie Soda, MD      . calcium-vitamin D (OSCAL WITH D) 500-200 MG-UNIT per tablet 1 tablet  1 tablet Oral Daily Karie Soda, MD   1 tablet at 10/29/17 [phone]  . diphenhydrAMINE (BENADRYL) 12.5 MG/5ML elixir 12.5 mg  12.5 mg Oral Q6H PRN Karie Soda, MD   12.5 mg at 10/27/17 1632   Or  . diphenhydrAMINE (BENADRYL) injection 12.5 mg  12.5 mg Intravenous Q6H PRN Karie Soda, MD      . feeding supplement (ENSURE SURGERY) liquid 237 mL  237 mL Oral BID BM Karie Soda, MD   237 mL at 10/29/17 0940  . ferrous sulfate tablet 325 mg  325 mg Oral BID WC Karie Soda, MD   325 mg at 10/29/17 9604  .  fluticasone (FLONASE) 50 MCG/ACT nasal spray 2 spray  2 spray Each Nare Daily Karie Soda, MD   2 spray at 10/29/17 [phone]  . gabapentin (NEURONTIN) capsule 300 mg  300 mg Oral BID Karie Soda, MD   300 mg at 10/29/17 9604  . hydrALAZINE (APRESOLINE) injection 10 mg  10 mg Intravenous Q2H PRN Karie Soda, MD      . HYDROmorphone (DILAUDID) injection 0.5-2 mg  0.5-2 mg Intravenous Q2H PRN Karie Soda, MD   1 mg at 10/28/17 1621  . HYDROmorphone (DILAUDID) tablet 2-4 mg  2-4 mg Oral Q6H PRN Karie Soda, MD   2 mg at 10/29/17 5409  . lactated ringers bolus 1,000 mL  1,000 mL Intravenous Q8H PRN Karie Soda, MD      . lactated ringers bolus 1,000 mL  1,000 mL Intravenous Q8H PRN Omar Gayden, Viviann Spare, MD      . lip balm (CARMEX) ointment 1 application  1 application Topical BID Karie Soda, MD   1 application at 10/29/17 [phone]  . magic mouthwash  15 mL Oral QID PRN Karie Soda, MD      . methocarbamol (ROBAXIN) 1,000 mg in dextrose 5 % 50 mL IVPB  1,000 mg Intravenous Q6H PRN Karie Soda, MD 120 mL/hr at 10/25/17 1702 1,000 mg at 10/25/17 1702  . metoCLOPramide (REGLAN) injection 10 mg  10 mg Intravenous Q6H PRN Karie Soda, MD   10 mg at 10/25/17 1832  . metoCLOPramide (REGLAN) tablet 10 mg  10 mg Oral Q6H PRN Karie Soda, MD      . metoprolol tartrate (LOPRESSOR) injection 5 mg  5 mg Intravenous Q6H PRN Karie Soda, MD      . metroNIDAZOLE (METROGEL) 0.75 % gel 1 application  1 application Topical Daily PRN Karie Soda, MD      . mometasone-formoterol (DULERA) 100-5 MCG/ACT inhaler 2 puff  2 puff Inhalation BID Karie Soda, MD   2 puff at 10/29/17 [phone]  . montelukast (SINGULAIR) tablet 10 mg  10 mg Oral Laurena Slimmer, MD   10 mg at 10/28/17 2119  . ondansetron (ZOFRAN) tablet 4 mg  4 mg Oral Q6H PRN Karie Soda, MD       Or  . ondansetron (ZOFRAN) injection 4 mg  4 mg Intravenous Q6H PRN Karie Soda, MD      . prochlorperazine (COMPAZINE) tablet 10 mg  10 mg Oral Q6H PRN Karie Soda, MD       Or  . prochlorperazine (COMPAZINE) injection 5-10 mg  5-10 mg Intravenous Q6H PRN Karie Soda, MD      . psyllium (HYDROCIL/METAMUCIL) packet 1 packet  1 packet Oral BID Karie Soda, MD   1 packet at 10/29/17 [phone]  . saccharomyces boulardii (FLORASTOR) capsule 250 mg  250 mg Oral BID Karie Soda, MD   250 mg at 10/29/17 2130  . sodium chloride 0.9 % bolus 500 mL  500 mL Intravenous Once PRN Jairo Ben, MD      . sodium chloride 0.9 % bolus 500 mL  500 mL Intravenous Once PRN Karie Soda, MD      . sodium chloride flush (NS) 0.9 % injection 3 mL  3 mL Intravenous Catha Gosselin, MD   3 mL at 10/28/17 2120  . sodium chloride flush (NS) 0.9 % injection 3 mL  3 mL Intravenous PRN Karie Soda, MD      . valACYclovir (VALTREX) tablet 500 mg  500 mg Oral Daily Karie Soda, MD   500 mg  at 10/29/17 1610   Facility-Administered Medications Ordered in Other Encounters  Medication Dose Route Frequency Provider Last Rate Last Dose  . gadopentetate dimeglumine (MAGNEVIST) injection 13 mL  13 mL Intravenous Once PRN Anson Fret, MD         Allergies  Allergen Reactions  . Ciprofloxacin Hcl Rash  . Codeine Itching    HYDROCODONE AND OXYCODONE ALSO  . Hydrocodone Itching     Tolerates Dilaudid fine  . Oxycodone Itching    Tolerates Dilaudid fine    BP (!) 103/45   Pulse 68   Temp 99 F (37.2 C) (Oral)   Resp 16   Ht 5\' 7"  (1.702 m)   Wt 150 lb 2.1 oz (68.1 kg)   LMP 08/30/2010   SpO2 100%   BMI 23.51 kg/m   Dg Chest Port 1 View  Result Date: 10/25/2017 CLINICAL DATA:  Acute respiratory distress. Left colectomy earlier this day. EXAM: PORTABLE CHEST 1 VIEW COMPARISON:  08/21/2014 FINDINGS: Low lung volumes.The cardiomediastinal contours are normal. The lungs are clear. Pulmonary vasculature is normal. No consolidation, pleural effusion, or pneumothorax. No acute osseous abnormalities are seen. IMPRESSION: Low lung volumes without acute abnormality. Electronically Signed   By: Rubye Oaks M.D.   On: 10/25/2017 21:35

## 2017-10-29 NOTE — Progress Notes (Signed)
Rapid Response Event Note  Overview: Called to room for hypotension and altered LOC   Initial Focused Assessment: When arrived in room, pt sitting in chair, lethargic. She is alert to person, place, time, situation. Strong radial and pedal pulses.     Interventions: CBG 121, SBP 88- 500 cc bolus administered.  [facility] medium soft [facility]. see VS flowsheet. Assisted back to bed.   Plan of Care (if not transferred): Order to administer 2 U PRBC.   Event Summary: Name of Physician Notified: Dr. Michaell CowingGross at (called by primary RN prior to arrival)  Outcome: Stayed in room and stabalized    Tara [name] B

## 2017-10-29 NOTE — Progress Notes (Signed)
Patient looking good this morning walking well tolerating solid food.  Hemoglobin low but stable.  Wrote to discharge with oral iron.  Getting ready to discharge the patient had some decreased mental status and systolic blood pressure in the 80s.  Recheck in the 100s.  IV fluid being given.  Will hold off on discharge.  Transfuse 2 units of red blood cells stat.  Orders placed

## 2017-10-29 NOTE — Progress Notes (Signed)
[address] [place], [place] [address] Phone: [phone]  FAX: [phone]      Silvia Hightower [id number] [birth year]  CARE TEAM:  PCP: Carol Ada, MD  Outpatient Care Team: Patient Care Team: Carol Ada, MD as PCP - General (Family Medicine) Michael Boston, MD as Consulting Physician (General Surgery) Star Age, MD as Attending Physician (Neurology) Teena Irani, MD (Inactive) as Consulting Physician (Gastroenterology) Clarene Essex, MD as Consulting Physician (Gastroenterology)  Inpatient Treatment Team: Treatment Team: Attending Provider: Michael Boston, MD; Registered Nurse: Heloise Ochoa, RN; Registered Nurse: Pennie Rushing, RN; Case Manager: Guadalupe Maple, RN; Technician: Etheleen Sia, [place]   Problem List:   Principal Problem:   Diverticulitis of left colon s/p left colectomy 10/25/2017 Active Problems:   Insomnia   Generalized anxiety disorder   Asthma in adult   Gastroesophageal reflux disease without esophagitis   Acute cholecystitis with chronic cholecystitis s/p lap cholecystectomy 10/25/2017   Postoperative anemia   4 Days Post-Op  10/25/2017  POST-OPERATIVE DIAGNOSIS:    Recurrent descending/sigmoid diverticulitis.   Acute on chronic calculus cholecystitis  PROCEDURE:   XI ROBOTIC ASSISTED RESECTION OF DISTAL DESCENDING & SIGMOID COLON RIGID PROCTOSCOPY ROBOTIC ASSISTED LAPAROSCOPIC CHOLECYSTECTOMY TAP BLOCK - bilateral  SURGEON:  Adin Hector, MD  OR FINDINGS:   Patient had somewhat redundant tortuous sigmoid colon with mild but definite thickening at the descending/sigmoid junction.  Resected.  It is a proximal descending colon to rectal end-to-end stapled anastomosis with 33 EEA stapler.  The colorectal anastomosis rests 12 cm from the anal verge by rigid proctoscopy.  Very enlarged gallbladder with moderate omental and transverse colon and duodenal  adhesions to it.  Clear colorless "white" bile within it = consistent with acute on chronic cholecystitis.       Assessment  Stable.  Possibly ready for discharge later today  Plan:  -low Hgb but no Sx, Hgb stable, & BP OK  -improved pain control.  Try PO dilaudid since tramadol not working & intolerant to other narcotics (tol Dilaudid IV) -VTE prophylaxis- SCDs, etc -mobilize as tolerated to help recovery  D/C patient from hospital when patient meets criteria (anticipate later today):    20 minutes spent in review, evaluation, examination, counseling, and coordination of care.  More than 50% of that time was spent in counseling.  Adin Hector, M.D., F.A.C.S. Gastrointestinal and Minimally Invasive Surgery [facility] [address], [place], [place] [address] [phone] Main / Paging   10/29/2017    Subjective: (Chief complaint)  Feeling good Eating well Walking   Objective:  Vital signs:  Vitals:   10/29/17 0822 10/29/17 1220 10/29/17 1226 10/29/17 1228  BP: (!) 107/40 (!) 88/38 (!) 109/47 (!) 103/45  Pulse: 71 67 68 68  Resp: _0 Temp: 99 F (37.2 C)     TempSrc: Oral     SpO2: 98% 100% 100% 100%  Weight:      Height:        Last BM Date: 10/29/17  Intake/Output   Yesterday:  04/15 0701 - 04/16 0700 In: 1093 [P.O.:1090; I.V.:3] Out: 2775 [Urine:2775] This shift:  Total I/O In: -  Out: 300 [Urine:300]  Bowel function:  Flatus: YES  BM:  YES  Drain: (No drain)   Physical Exam:  General: Pt awake/alert/oriented x4 in no acute distress.  tited not toxic Eyes: PERRL, normal EOM.  Sclera clear.  No icterus Neuro: CN  II-XII intact w/o focal sensory/motor deficits. Lymph: No head/neck/groin lymphadenopathy Psych:  No delerium/psychosis/paranoia HENT: Normocephalic, Mucus membranes moist.  No thrush Neck: Supple, No tracheal deviation Chest: No chest wall pain w good excursion CV:   Pulses intact.  Regular rhythm MS: Normal AROM mjr joints.  No obvious deformity  Abdomen: Soft.  Nondistended.  Nontender.  Dressings and weeks removed.  Some ecchymosis at the suprapubic incision.  No evidence of peritonitis.  No incarcerated hernias.  Ext:  No deformity.  No mjr edema.  No cyanosis Skin: No petechiae / purpura  Results:   Labs: Results for orders placed or performed during the hospital encounter of 10/25/17 (from the past 48 hour(s))  Hemoglobin     Status: Abnormal   Collection Time: 10/28/17  8:55 AM  Result Value Ref Range   Hemoglobin 6.2 (LL) 12.0 - 15.0 g/dL    Comment: REPEATED TO VERIFY CRITICAL RESULT CALLED TO, READ BACK BY AND VERIFIED WITH: WRIGHT,M @ 0910 ON [id number] BY POTEAT,S Performed at [facility], [place] [address] [place], [place] [address]   Potassium     Status: None   Collection Time: 10/28/17  8:55 AM  Result Value Ref Range   Potassium 3.5 3.5 - 5.1 mmol/L    Comment: Performed at [facility], [place] [address] [place], [place] [address]  Magnesium     Status: None   Collection Time: 10/28/17  8:55 AM  Result Value Ref Range   Magnesium 2.0 1.7 - 2.4 mg/dL    Comment: Performed at [facility], [place] [address] [place], [place] [address]  Creatinine, serum     Status: None   Collection Time: 10/28/17  8:55 AM  Result Value Ref Range   Creatinine, Ser 0.80 0.44 - 1.00 mg/dL   GFR calc non Af Amer >60 >60 mL/min   GFR calc Af Amer >60 >60 mL/min    Comment: (NOTE) The eGFR has been calculated using the CKD EPI equation. This calculation has not been validated in all clinical situations. eGFR's persistently <60 mL/min signify possible Chronic Kidney Disease. Performed at [facility], [place] [address] [place], [place] [address]   Hemoglobin     Status: Abnormal   Collection Time: 10/29/17  4:32 AM  Result Value Ref Range   Hemoglobin 6.3 (LL) 12.0 - 15.0  g/dL    Comment: CRITICAL VALUE NOTED.  VALUE IS CONSISTENT WITH PREVIOUSLY REPORTED AND CALLED VALUE. Performed at [facility], [place] [address] [place], [place] [address]   Magnesium     Status: None   Collection Time: 10/29/17  4:32 AM  Result Value Ref Range   Magnesium 1.8 1.7 - 2.4 mg/dL    Comment: Performed at [facility], [place] [address], [place], [place] [address]  Potassium     Status: Abnormal   Collection Time: 10/29/17  4:32 AM  Result Value Ref Range   Potassium 3.4 (L) 3.5 - 5.1 mmol/L    Comment: Performed at [facility], [place] [address] [place],  [address]  Glucose, capillary     Status: Abnormal   Collection Time: 10/29/17 12:20 PM  Result Value Ref Range   Glucose-Capillary 121 (H) 65 - 99 mg/dL    Imaging / Studies: No results found.  Medications / Allergies: per chart  Antibiotics: Anti-infectives (From admission, onward)   Start     Dose/Rate Route Frequency Ordered Stop   10/26/17 0100  cefoTEtan (CEFOTAN) 2 g in sodium chloride 0.9 % 100 mL IVPB     2 g 200 mL/hr over 30 Minutes Intravenous Every 12 hours 10/25/17 1802 10/26/17 0102   10/25/17 2000  valACYclovir (VALTREX) tablet 500 mg     500 mg Oral Daily 10/25/17 1802     10/25/17 0930  clindamycin (CLEOCIN) 900 mg, gentamicin (GARAMYCIN) 240 mg in sodium chloride 0.9 % 1,000 mL for intraperitoneal lavage  Status:  Discontinued      Intraperitoneal To Surgery 10/25/17 0929 10/25/17 1738   10/25/17 0928  cefoTEtan in Dextrose 5% (CEFOTAN) IVPB 2 g     2 g Intravenous On call to O.R. 10/25/17 7262 10/25/17 1348   10/25/17 0928  neomycin (MYCIFRADIN) tablet 1,000 mg  Status:  Discontinued     1,000 mg Oral 3 times per day 10/25/17 0929 10/25/17 1738   10/25/17 0928  metroNIDAZOLE (FLAGYL) tablet 1,000 mg  Status:  Discontinued     1,000 mg Oral 3 times per day 10/25/17 0355 10/25/17 1738        Note: Portions of this report may  have been transcribed using voice recognition software. Every effort was made to ensure accuracy; however, inadvertent computerized transcription errors may be present.   Any transcriptional errors that result from this process are unintentional.     Adin Hector, M.D., F.A.C.S. Gastrointestinal and Minimally Invasive Surgery [facility] [address] [place], [place] [address] [phone] Main / Paging   10/29/2017

## 2017-10-30 LAB — BPAM RBC
Blood Product Expiration Date: 201905152359
Blood Product Expiration Date: 201905152359
ISSUE DATE / TIME: 201904161837
ISSUE DATE / TIME: 201904161558
UNIT TYPE AND RH: 5100
Unit Type and Rh: 5100

## 2017-10-30 LAB — BASIC METABOLIC PANEL
ANION GAP: 8 (ref 5–15)
BUN: 12 mg/dL (ref 6–20)
CO2: 25 mmol/L (ref 22–32)
Calcium: 8.3 mg/dL — ABNORMAL LOW (ref 8.9–10.3)
Chloride: 106 mmol/L (ref 101–111)
Creatinine, Ser: 0.73 mg/dL (ref 0.44–1.00)
GFR calc Af Amer: >60 mL/min (ref >60)
GFR calc non Af Amer: >60 mL/min (ref >60)
GLUCOSE: 95 mg/dL (ref 65–99)
Potassium: 3.4 mmol/L — ABNORMAL LOW (ref 3.5–5.1)
Sodium: 139 mmol/L (ref 135–145)

## 2017-10-30 LAB — TYPE AND SCREEN
ABO/RH(D): O POS
Antibody Screen: NEGATIVE
UNIT DIVISION: 0
UNIT DIVISION: 0

## 2017-10-30 LAB — CBC
HCT: 24.3 % — ABNORMAL LOW (ref 36.0–46.0)
HCT: 24.7 % — ABNORMAL LOW (ref 36.0–46.0)
HEMOGLOBIN: 8.2 g/dL — AB (ref 12.0–15.0)
HEMOGLOBIN: 8.3 g/dL — AB (ref 12.0–15.0)
MCH: 31.4 pg (ref 26.0–34.0)
MCH: 31.2 pg (ref 26.0–34.0)
MCHC: 33.7 g/dL (ref 30.0–36.0)
MCHC: 33.6 g/dL (ref 30.0–36.0)
MCV: 93.1 fL (ref 78.0–100.0)
MCV: 92.9 fL (ref 78.0–100.0)
Platelets: 210 10*3/uL (ref 150–400)
Platelets: 207 10*3/uL (ref 150–400)
RBC: 2.61 MIL/uL — AB (ref 3.87–5.11)
RBC: 2.66 MIL/uL — ABNORMAL LOW (ref 3.87–5.11)
RDW: 15.3 % (ref 11.5–15.5)
RDW: 16 % — AB (ref 11.5–15.5)
WBC: 6.9 10*3/uL (ref 4.0–10.5)
WBC: 6.4 10*3/uL (ref 4.0–10.5)

## 2017-10-30 LAB — GLUCOSE, CAPILLARY: Glucose-Capillary: 103 mg/dL — ABNORMAL HIGH (ref 65–99)

## 2017-10-30 MED ORDER — POTASSIUM CHLORIDE CRYS ER 20 MEQ PO TBCR
40.0000 meq | EXTENDED_RELEASE_TABLET | Freq: Two times a day (BID) | ORAL | Status: DC
  Administered 2017-10-30 – 2017-11-01 (×5): 40 meq via ORAL
  Filled 2017-10-30 (×5): qty 2

## 2017-10-30 MED ORDER — CLOTRIMAZOLE 1 % EX CREA
TOPICAL_CREAM | Freq: Two times a day (BID) | CUTANEOUS | Status: DC
  Administered 2017-10-30: 16:00:00 via TOPICAL
  Administered 2017-10-31: 1 via TOPICAL
  Filled 2017-10-30: qty 15

## 2017-10-30 MED ORDER — HYDROMORPHONE HCL 2 MG PO TABS
2.0000 mg | ORAL_TABLET | Freq: Four times a day (QID) | ORAL | Status: DC | PRN
  Administered 2017-10-30: 2 mg via ORAL
  Filled 2017-10-30: qty 1

## 2017-10-30 NOTE — Progress Notes (Signed)
Patient was seen by Rapid Response.  RR evaluated CBG normal, Vital signs and Temperature with persistent fever, Lungs with diminished breath sound. Patient c.o pain and tenderness to touch on the left side of her abdomen.  Dr Daphine DeutscherMartin notified for further evaluation.

## 2017-10-30 NOTE — Progress Notes (Signed)
Previous elevated temperature of 101.4 recorded.  Repeated elevated temperature read 101.0. Patient received Tylenol @ 1800. IS encouraged.  Diminished breath sound in the lower lobes on auscultation.  On-call doctor notified for further evaluation.

## 2017-10-30 NOTE — Progress Notes (Addendum)
[name] [place], [place] [address] Phone: [phone]  FAX: [phone]      Tara [name] [id number] 01-29-1961  CARE TEAM:  PCP: Carol Ada, MD  Outpatient Care Team: Patient Care Team: Carol Ada, MD as PCP - General (Family Medicine) Michael Boston, MD as Consulting Physician (General Surgery) Star Age, MD as Attending Physician (Neurology) Teena Irani, MD (Inactive) as Consulting Physician (Gastroenterology) Clarene Essex, MD as Consulting Physician (Gastroenterology)  Inpatient Treatment Team: Treatment Team: Attending Provider: Michael Boston, MD; Registered Nurse: Heloise Ochoa, RN; Registered Nurse: Pennie Rushing, RN; Technician: Etheleen Sia, [place]   Problem List:   Principal Problem:   Diverticulitis of left colon s/p left colectomy 10/25/2017 Active Problems:   Insomnia   Generalized anxiety disorder   Asthma in adult   Gastroesophageal reflux disease without esophagitis   Acute cholecystitis with chronic cholecystitis s/p lap cholecystectomy 10/25/2017   Postoperative anemia   5 Days Post-Op  10/25/2017  POST-OPERATIVE DIAGNOSIS:    Recurrent descending/sigmoid diverticulitis.   Acute on chronic calculus cholecystitis  PROCEDURE:   XI ROBOTIC ASSISTED RESECTION OF DISTAL DESCENDING & SIGMOID COLON RIGID PROCTOSCOPY ROBOTIC ASSISTED LAPAROSCOPIC CHOLECYSTECTOMY TAP BLOCK - bilateral  SURGEON:  Adin Hector, MD  OR FINDINGS:   Patient had somewhat redundant tortuous sigmoid colon with mild but definite thickening at the descending/sigmoid junction.  Resected.  It is a proximal descending colon to rectal end-to-end stapled anastomosis with 33 EEA stapler.  The colorectal anastomosis rests 12 cm from the anal verge by rigid proctoscopy.  Very enlarged gallbladder with moderate omental and transverse colon and duodenal adhesions to it.  Clear colorless "white"  bile within it = consistent with acute on chronic cholecystitis.       Assessment  Improved  Plan:  -anemia - low Hgb s/p transfusion yesterday - better so far -ortho VS -mobilize more -hypoK - replace & check Mag -improved pain control w PO PRN dilaudid & standing tylenol/gabaentin -VTE prophylaxis- SCDs, etc -mobilize as tolerated to help recovery.  GET HER UP! - doing better w that now  D/C patient from hospital when patient meets criteria (anticipate in 1-2 day(s)):  Tolerating oral intake well Ambulating well Adequate pain control without IV medications Urinating  Having flatus Disposition planning in place     25 minutes spent in review, evaluation, examination, counseling, and coordination of care.  More than 50% of that time was spent in counseling.  Adin Hector, M.D., F.A.C.S. Gastrointestinal and Minimally Invasive Surgery [facility] [address] [place],  [address] [phone] Main / Paging   10/30/2017    Subjective: (Chief complaint)  Feeling much better after 2U pRBCs Pain better controlled w PRN Dilaudid Eating well Walking more in hallways Boyfriend in room  Objective:  Vital signs:  Vitals:   10/29/17 2139 10/29/17 2214 10/30/17 0218 10/30/17 0529  BP: (!) 100/48 (!) 118/55 (!) 113/48 (!) 113/58  Pulse: 65 66 71 (!) 58  Resp: _0 Temp: 98.9 F (37.2 C)  99.5 F (37.5 C) 98.4 F (36.9 C)  TempSrc: Oral  Oral Oral  SpO2: 100%  98% 100%  Weight:    149 lb 14.6 oz (68 kg)  Height:        Last BM Date: 10/29/17  Intake/Output   Yesterday:  04/16 0701 - 04/17 0700 In: 650 [P.O.:180; I.V.:3; Blood:467] Out: 1250 [Urine:1250] This shift:  No intake/output  data recorded.  Bowel function:  Flatus: YES  BM:  YES  Drain: (No drain)   Physical Exam:  General: Pt awake/alert/oriented x4 in no acute distress.  More alert.  Smiling.  Brushing teeth Eyes: PERRL,  normal EOM.  Sclera clear.  No icterus Neuro: CN II-XII intact w/o focal sensory/motor deficits. Lymph: No head/neck/groin lymphadenopathy Psych:  No delerium/psychosis/paranoia HENT: Normocephalic, Mucus membranes moist.  No thrush Neck: Supple, No tracheal deviation Chest: No chest wall pain w good excursion CV:  Pulses intact.  Regular rhythm MS: Normal AROM mjr joints.  No obvious deformity  Abdomen: Soft.  Nondistended.  Nontender.  Ecchymosis at flank & suprapubic incision stable.  No evidence of peritonitis.  No incarcerated hernias.  Ext:  No deformity.  No mjr edema.  No cyanosis Skin: No petechiae / purpura  Results:   Labs: Results for orders placed or performed during the hospital encounter of 10/25/17 (from the past 48 hour(s))  Hemoglobin     Status: Abnormal   Collection Time: 10/28/17  8:55 AM  Result Value Ref Range   Hemoglobin 6.2 (LL) 12.0 - 15.0 g/dL    Comment: REPEATED TO VERIFY CRITICAL RESULT CALLED TO, READ BACK BY AND VERIFIED WITH: WRIGHT,M @ 0910 ON [id number] BY POTEAT,S Performed at [facility] [place] [address] [place], [place] [address]   Potassium     Status: None   Collection Time: 10/28/17  8:55 AM  Result Value Ref Range   Potassium 3.5 3.5 - 5.1 mmol/L    Comment: Performed at [facility], [place] [address] [place], [place] [address]  Magnesium     Status: None   Collection Time: 10/28/17  8:55 AM  Result Value Ref Range   Magnesium 2.0 1.7 - 2.4 mg/dL    Comment: Performed at [facility], [place] [address] [place], [place] [address]  Creatinine, serum     Status: None   Collection Time: 10/28/17  8:55 AM  Result Value Ref Range   Creatinine, Ser 0.80 0.44 - 1.00 mg/dL   GFR calc non Af Amer >60 >60 mL/min   GFR calc Af Amer >60 >60 mL/min    Comment: (NOTE) The eGFR has been calculated using the CKD EPI equation. This calculation has not been validated in all clinical  situations. eGFR's persistently <60 mL/min signify possible Chronic Kidney Disease. Performed at [facility], [place] [address], [place], [place] [address]   Hemoglobin     Status: Abnormal   Collection Time: 10/29/17  4:32 AM  Result Value Ref Range   Hemoglobin 6.3 (LL) 12.0 - 15.0 g/dL    Comment: CRITICAL VALUE NOTED.  VALUE IS CONSISTENT WITH PREVIOUSLY REPORTED AND CALLED VALUE. Performed at [facility], [place] [address] [place], [place] [address]   Magnesium     Status: None   Collection Time: 10/29/17  4:32 AM  Result Value Ref Range   Magnesium 1.8 1.7 - 2.4 mg/dL    Comment: Performed at [facility], [place] [address], [place], [place] [address]  Potassium     Status: Abnormal   Collection Time: 10/29/17  4:32 AM  Result Value Ref Range   Potassium 3.4 (L) 3.5 - 5.1 mmol/L    Comment: Performed at [facility], [place] [address], [place], [place] [address]  Glucose, capillary     Status: Abnormal   Collection Time: 10/29/17 12:20 PM  Result Value Ref Range  Glucose-Capillary 121 (H) 65 - 99 mg/dL  Prepare RBC     Status: None   Collection Time: 10/29/17  1:57 PM  Result Value Ref Range   Order Confirmation      ORDER PROCESSED BY BLOOD BANK Performed at [facility], [place] [address] [place], [place] [address]   Type and screen [place]     Status: None (Preliminary result)   Collection Time: 10/29/17  1:57 PM  Result Value Ref Range   ABO/RH(D) O POS    Antibody Screen NEG    Sample Expiration 11/01/2017    Unit Number [id number]    Blood Component Type RED CELLS,LR    Unit division 00    Status of Unit ISSUED    Transfusion Status OK TO TRANSFUSE    Crossmatch Result Compatible    Unit Number [id number]    Blood Component Type RED CELLS,LR    Unit division 00    Status of Unit ISSUED    Transfusion Status OK TO TRANSFUSE    Crossmatch  Result      Compatible Performed at [facility], [place] [address], [place], [place] [address]   CBC     Status: Abnormal   Collection Time: 10/29/17 10:03 PM  Result Value Ref Range   WBC 6.9 4.0 - 10.5 K/uL   RBC 2.61 (L) 3.87 - 5.11 MIL/uL   Hemoglobin 8.2 (L) 12.0 - 15.0 g/dL    Comment: DELTA CHECK NOTED REPEATED TO VERIFY POST TRANSFUSION SPECIMEN    HCT 24.3 (L) 36.0 - 46.0 %   MCV 93.1 78.0 - 100.0 fL   MCH 31.4 26.0 - 34.0 pg   MCHC 33.7 30.0 - 36.0 g/dL   RDW 15.3 11.5 - 15.5 %   Platelets 210 150 - 400 K/uL    Comment: Performed at [facility], [place] [address] [place], [place] [address]  Basic metabolic panel     Status: Abnormal   Collection Time: 10/30/17  4:26 AM  Result Value Ref Range   Sodium 139 135 - 145 mmol/L   Potassium 3.4 (L) 3.5 - 5.1 mmol/L   Chloride 106 101 - 111 mmol/L   CO2 25 22 - 32 mmol/L   Glucose, Bld 95 65 - 99 mg/dL   BUN 12 6 - 20 mg/dL   Creatinine, Ser 0.73 0.44 - 1.00 mg/dL   Calcium 8.3 (L) 8.9 - 10.3 mg/dL   GFR calc non Af Amer >60 >60 mL/min   GFR calc Af Amer >60 >60 mL/min    Comment: (NOTE) The eGFR has been calculated using the CKD EPI equation. This calculation has not been validated in all clinical situations. eGFR's persistently <60 mL/min signify possible Chronic Kidney Disease.    Anion gap 8 5 - 15    Comment: Performed at [facility], [place] [address], [place], [place] [address]  CBC     Status: Abnormal   Collection Time: 10/30/17  4:26 AM  Result Value Ref Range   WBC 6.4 4.0 - 10.5 K/uL   RBC 2.66 (L) 3.87 - 5.11 MIL/uL   Hemoglobin 8.3 (L) 12.0 - 15.0 g/dL   HCT 24.7 (L) 36.0 - 46.0 %   MCV 92.9 78.0 - 100.0 fL   MCH 31.2 26.0 - 34.0 pg   MCHC 33.6 30.0 - 36.0 g/dL   RDW 16.0 (H) 11.5 - 15.5 %   Platelets 207 150 - 400 K/uL  Comment: Performed at [facility], [place] [address], [place], [place] [address]    Imaging /  Studies: No results found.  Medications / Allergies: per chart  Antibiotics: Anti-infectives (From admission, onward)   Start     Dose/Rate Route Frequency Ordered Stop   10/26/17 0100  cefoTEtan (CEFOTAN) 2 g in sodium chloride 0.9 % 100 mL IVPB     2 g 200 mL/hr over 30 Minutes Intravenous Every 12 hours 10/25/17 1802 10/26/17 0102   10/25/17 2000  valACYclovir (VALTREX) tablet 500 mg     500 mg Oral Daily 10/25/17 1802     10/25/17 0930  clindamycin (CLEOCIN) 900 mg, gentamicin (GARAMYCIN) 240 mg in sodium chloride 0.9 % 1,000 mL for intraperitoneal lavage  Status:  Discontinued      Intraperitoneal To Surgery 10/25/17 0929 10/25/17 1738   10/25/17 0928  cefoTEtan in Dextrose 5% (CEFOTAN) IVPB 2 g     2 g Intravenous On call to O.R. 10/25/17 0881 10/25/17 1348   10/25/17 0928  neomycin (MYCIFRADIN) tablet 1,000 mg  Status:  Discontinued     1,000 mg Oral 3 times per day 10/25/17 0929 10/25/17 1738   10/25/17 0928  metroNIDAZOLE (FLAGYL) tablet 1,000 mg  Status:  Discontinued     1,000 mg Oral 3 times per day 10/25/17 1031 10/25/17 1738        Note: Portions of this report may have been transcribed using voice recognition software. Every effort was made to ensure accuracy; however, inadvertent computerized transcription errors may be present.   Any transcriptional errors that result from this process are unintentional.     Adin Hector, M.D., F.A.C.S. Gastrointestinal and Minimally Invasive Surgery [facility] [address] [place], [place] [address] [phone] Main / Paging   10/30/2017

## 2017-10-30 NOTE — Progress Notes (Signed)
Family member verbalized concerns about patient's response. Rapid Response called for further evaluation.

## 2017-10-30 NOTE — Progress Notes (Signed)
Path benign. I told the pt the good news  Copy of report w patient  Ardeth SportsmanSteven C. Hazem Kenner, M.D., F.A.C.S. Gastrointestinal and Minimally Invasive Surgery [facility] [address] [place], [place] [address] [phone] Main / Paging

## 2017-10-30 NOTE — Consult Note (Signed)
WOC Nurse wound consult note Reason for Consult: rash between legs No incontinence, no new medications, no other definitive cause. Patient is ambulatory.  Wound type: erythematous rash, inner leg folds/labial area.  Patient reports similar rash 2 years ago with previous hospitalization. No other areas affected. Patient feels may be related to moisture, she is not incontinent. She has no new discharge.  It does not itch at this time Pressure Injury POA: NA Slightly weepy Periwound: intact, I do not note any satellite lesions at this time Dressing procedure/placement/frequency: Add antifungal cream, DC zinc based barrier Patient to clean area with warm washcloth, dry and apply antifungal cream at least BID.   Discussed POC with patient and bedside nurse.  Re consult if needed, will not follow at this time. Thanks  Irmalee Riemenschneider M.D.C[employer] MSN, RN,CWOCN, CNS, CWON-AP [phone])

## 2017-10-30 NOTE — Progress Notes (Signed)
   10/30/17 1146 10/30/17 1150 10/30/17 1154  Vitals  BP (!) 107/48 (!) 109/52 (!) 109/57  MAP (mmHg) (!) 64 69 73  BP Location Left Arm Left Arm Left Arm  BP Method Automatic Automatic Automatic  Patient Position (if appropriate) Lying Sitting Standing  Pulse Rate 70 75 74  Pulse Rate Source Monitor Monitor Monitor

## 2017-10-31 ENCOUNTER — Inpatient Hospital Stay (HOSPITAL_COMMUNITY): Payer: BLUE CROSS/BLUE SHIELD

## 2017-10-31 LAB — COMPREHENSIVE METABOLIC PANEL
ALT: 126 U/L — ABNORMAL HIGH (ref 14–54)
ANION GAP: 9 (ref 5–15)
AST: 107 U/L — AB (ref 15–41)
Albumin: 2.8 g/dL — ABNORMAL LOW (ref 3.5–5.0)
Alkaline Phosphatase: 67 U/L (ref 38–126)
BILIRUBIN TOTAL: 1 mg/dL (ref 0.3–1.2)
BUN: 12 mg/dL (ref 6–20)
CHLORIDE: 108 mmol/L (ref 101–111)
CO2: 23 mmol/L (ref 22–32)
Calcium: 8.7 mg/dL — ABNORMAL LOW (ref 8.9–10.3)
Creatinine, Ser: 0.74 mg/dL (ref 0.44–1.00)
Glucose, Bld: 163 mg/dL — ABNORMAL HIGH (ref 65–99)
POTASSIUM: 3.5 mmol/L (ref 3.5–5.1)
Sodium: 140 mmol/L (ref 135–145)
TOTAL PROTEIN: 5.9 g/dL — AB (ref 6.5–8.1)

## 2017-10-31 LAB — CBC
HEMATOCRIT: 27.8 % — AB (ref 36.0–46.0)
HEMOGLOBIN: 9.2 g/dL — AB (ref 12.0–15.0)
MCH: 31.5 pg (ref 26.0–34.0)
MCHC: 33.1 g/dL (ref 30.0–36.0)
MCV: 95.2 fL (ref 78.0–100.0)
Platelets: 304 10*3/uL (ref 150–400)
RBC: 2.92 MIL/uL — AB (ref 3.87–5.11)
RDW: 15.9 % — ABNORMAL HIGH (ref 11.5–15.5)
WBC: 8 10*3/uL (ref 4.0–10.5)

## 2017-10-31 LAB — LIPASE, BLOOD: LIPASE: 22 U/L (ref 11–51)

## 2017-10-31 MED ORDER — METHOCARBAMOL 500 MG PO TABS
750.0000 mg | ORAL_TABLET | Freq: Three times a day (TID) | ORAL | Status: DC
  Administered 2017-10-31 – 2017-11-01 (×4): 750 mg via ORAL
  Filled 2017-10-31 (×5): qty 2

## 2017-10-31 MED ORDER — IOPAMIDOL (ISOVUE-300) INJECTION 61%: 15.0000 mL | Freq: Two times a day (BID) | INTRAVENOUS | Status: DC | PRN

## 2017-10-31 MED ORDER — IOPAMIDOL (ISOVUE-300) INJECTION 61%
INTRAVENOUS | Status: AC
  Filled 2017-10-31: qty 30

## 2017-10-31 MED ORDER — IOHEXOL 300 MG/ML  SOLN
100.0000 mL | Freq: Once | INTRAMUSCULAR | Status: AC | PRN
  Administered 2017-10-31: 100 mL via INTRAVENOUS

## 2017-10-31 MED ORDER — METHOCARBAMOL 500 MG PO TABS: 1000.0000 mg | ORAL_TABLET | Freq: Four times a day (QID) | ORAL | Status: DC

## 2017-10-31 MED ORDER — HYDROMORPHONE HCL 2 MG PO TABS
2.0000 mg | ORAL_TABLET | ORAL | Status: DC | PRN
  Administered 2017-10-31 – 2017-11-01 (×2): 2 mg via ORAL
  Filled 2017-10-31 (×2): qty 1

## 2017-10-31 MED ORDER — GABAPENTIN 300 MG PO CAPS
300.0000 mg | ORAL_CAPSULE | Freq: Every day | ORAL | Status: DC
Start: 2017-10-31 — End: 2017-11-01
  Administered 2017-10-31: 300 mg via ORAL

## 2017-10-31 MED ORDER — IBUPROFEN 200 MG PO TABS
400.0000 mg | ORAL_TABLET | Freq: Four times a day (QID) | ORAL | Status: DC | PRN
  Administered 2017-10-31 – 2017-11-01 (×2): 600 mg via ORAL
  Filled 2017-10-31 (×2): qty 3

## 2017-10-31 NOTE — Progress Notes (Signed)
Patient asking for IBUprophen, She was on it for pain and fevers.  But it dropped of MAR after 2pm as it was scheduled for 2 day.  Notified on call MD.

## 2017-10-31 NOTE — Progress Notes (Signed)
PT Cancellation Note  Patient Details Name: Tara PointerJill Alperin MRN: [id number] DOB: 08/11/1960   Cancelled Treatment:    Reason Eval/Treat Not Completed: Medical issues which prohibited therapy. [facility]. Will check back another time.   Rada [place]Hill, Jaydalynn Olivero Elizabeth 10/31/2017, 11:53 AM Blanchard KelchKaren Dewel Lotter PT [phone]

## 2017-10-31 NOTE — Progress Notes (Addendum)
[address] [place], [place] [address] Phone: [phone]  FAX: [phone]      Tara [name] [id number] May 14, 1961  CARE TEAM:  PCP: Carol Ada, MD  Outpatient Care Team: Patient Care Team: Carol Ada, MD as PCP - General (Family Medicine) Michael Boston, MD as Consulting Physician (General Surgery) Star Age, MD as Attending Physician (Neurology) Teena Irani, MD (Inactive) as Consulting Physician (Gastroenterology) Clarene Essex, MD as Consulting Physician (Gastroenterology)  Inpatient Treatment Team: Treatment Team: Attending Provider: Michael Boston, MD; Registered Nurse: Heloise Ochoa, RN; Technician: Etheleen Sia, NT; Registered Nurse: Mortimer Fries, RN; Technician: Sherrill Raring, NT; Technician: Leda Quail, NT   Problem List:   Principal Problem:   Diverticulitis of left colon s/p left colectomy 10/25/2017 Active Problems:   Insomnia   Generalized anxiety disorder   Asthma in adult   Gastroesophageal reflux disease without esophagitis   Acute cholecystitis with chronic cholecystitis s/p lap cholecystectomy 10/25/2017   Postoperative anemia   6 Days Post-Op  10/25/2017  POST-OPERATIVE DIAGNOSIS:    Recurrent descending/sigmoid diverticulitis.   Acute on chronic calculus cholecystitis  PROCEDURE:   XI ROBOTIC ASSISTED RESECTION OF DISTAL DESCENDING & SIGMOID COLON RIGID PROCTOSCOPY ROBOTIC ASSISTED LAPAROSCOPIC CHOLECYSTECTOMY TAP BLOCK - bilateral  SURGEON:  Adin Hector, MD  OR FINDINGS:   Patient had somewhat redundant tortuous sigmoid colon with mild but definite thickening at the descending/sigmoid junction.  Resected.  It is a proximal descending colon to rectal end-to-end stapled anastomosis with 33 EEA stapler.  The colorectal anastomosis rests 12 cm from the anal verge by rigid proctoscopy.  Very enlarged gallbladder with moderate  omental and transverse colon and duodenal adhesions to it.  Clear colorless "white" bile within it = consistent with acute on chronic cholecystitis.       Assessment  OK/Fair - hard to read  Plan:  -CT scan r/o abscess, etc -anemia - low Hgb s/p transfusion yesterday -ortho VS   -mobilize more -hypoK - replace & check Mag -improve pain control w PO PRN dilaudid & standing tylenol/gabaentin.  Add robaxin -VTE prophylaxis- SCDs, etc -mobilize as tolerated to help recovery.  GET HER UP! - doing better w that now      25 minutes spent in review, evaluation, examination, counseling, and coordination of care.  More than 50% of that time was spent in counseling.  Adin Hector, M.D., F.A.C.S. Gastrointestinal and Minimally Invasive Surgery [facility] [address] [place], [place] [address] [phone] Main / Paging   10/31/2017    Subjective: (Chief complaint)  Feeling good during day  Felt sweaty and uncomfortable last night.  Temp spike.  Fianc concerned.  WOCN consulted for rash on perineum.  Felt fine a few hours later She feels better this morning but sore.  Prefers IV Dilaudid over p.o.  Objective:  Vital signs:  Vitals:   10/31/17 0000 10/31/17 0043 10/31/17 0401 10/31/17 0642  BP: (!) 107/50  (!) 107/48   Pulse: 77  63   Resp: 16  16   Temp: (!) 100.6 F (38.1 C) 100 F (37.8 C) 98.9 F (37.2 C)   TempSrc: Oral  Oral   SpO2: 98%     Weight:    144 lb (65.3 kg)  Height:        Last BM Date: 10/30/17  Intake/Output   Yesterday:  04/17 0701 - 04/18 0700 In: 243 [P.O.:240; I.V.:3] Out: 1225 [Urine:1225] This  shift:  No intake/output data recorded.  Bowel function:  Flatus: YES  BM:  YES  Drain: (No drain)   Physical Exam:  General: Pt awake/alert/oriented x4 in no acute distress.  More alert.  Smiling.  Brushing teeth Eyes: PERRL, normal EOM.  Sclera clear.  No icterus Neuro: CN II-XII intact  w/o focal sensory/motor deficits. Lymph: No head/neck/groin lymphadenopathy Psych:  No delerium/psychosis/paranoia HENT: Normocephalic, Mucus membranes moist.  No thrush Neck: Supple, No tracheal deviation Chest: No chest wall pain w good excursion CV:  Pulses intact.  Regular rhythm MS: Normal AROM mjr joints.  No obvious deformity  Abdomen: Soft.  Nondistended.  Tenderness at RLQ .  Ecchymosis at flank & suprapubic incision resolved.  No evidence of peritonitis.  No incarcerated hernias.  Ext:  No deformity.  No mjr edema.  No cyanosis Skin: No petechiae / purpura  Results:   Labs: Results for orders placed or performed during the hospital encounter of 10/25/17 (from the past 48 hour(s))  Glucose, capillary     Status: Abnormal   Collection Time: 10/29/17 12:20 PM  Result Value Ref Range   Glucose-Capillary 121 (H) 65 - 99 mg/dL  Prepare RBC     Status: None   Collection Time: 10/29/17  1:57 PM  Result Value Ref Range   Order Confirmation      ORDER PROCESSED BY BLOOD BANK Performed at [place] [address], [place], [place] [address]   Type and screen [place]     Status: None   Collection Time: 10/29/17  1:57 PM  Result Value Ref Range   ABO/RH(D) O POS    Antibody Screen NEG    Sample Expiration 11/01/2017    Unit Number [id number]    Blood Component Type RED CELLS,LR    Unit division 00    Status of Unit ISSUED,FINAL    Transfusion Status OK TO TRANSFUSE    Crossmatch Result Compatible    Unit Number [id number]    Blood Component Type RED CELLS,LR    Unit division 00    Status of Unit ISSUED,FINAL    Transfusion Status OK TO TRANSFUSE    Crossmatch Result      Compatible Performed at [facility], [place] [address] [place], [place] [address]   CBC     Status: Abnormal   Collection Time: 10/29/17 10:03 PM  Result Value Ref Range   WBC 6.9 4.0 - 10.5 K/uL   RBC 2.61 (L) 3.87 - 5.11 MIL/uL    Hemoglobin 8.2 (L) 12.0 - 15.0 g/dL    Comment: DELTA CHECK NOTED REPEATED TO VERIFY POST TRANSFUSION SPECIMEN    HCT 24.3 (L) 36.0 - 46.0 %   MCV 93.1 78.0 - 100.0 fL   MCH 31.4 26.0 - 34.0 pg   MCHC 33.7 30.0 - 36.0 g/dL   RDW 15.3 11.5 - 15.5 %   Platelets 210 150 - 400 K/uL    Comment: Performed at [facility], [place] [address] [place], [place] [address]  Basic metabolic panel     Status: Abnormal   Collection Time: 10/30/17  4:26 AM  Result Value Ref Range   Sodium 139 135 - 145 mmol/L   Potassium 3.4 (L) 3.5 - 5.1 mmol/L   Chloride 106 101 - 111 mmol/L   CO2 25 22 - 32 mmol/L   Glucose, Bld 95 65 - 99 mg/dL   BUN 12 6 - 20 mg/dL   Creatinine, Ser  0.73 0.44 - 1.00 mg/dL   Calcium 8.3 (L) 8.9 - 10.3 mg/dL   GFR calc non Af Amer >60 >60 mL/min   GFR calc Af Amer >60 >60 mL/min    Comment: (NOTE) The eGFR has been calculated using the CKD EPI equation. This calculation has not been validated in all clinical situations. eGFR's persistently <60 mL/min signify possible Chronic Kidney Disease.    Anion gap 8 5 - 15    Comment: Performed at [facility], [place] [address] [place], [place] [address]  CBC     Status: Abnormal   Collection Time: 10/30/17  4:26 AM  Result Value Ref Range   WBC 6.4 4.0 - 10.5 K/uL   RBC 2.66 (L) 3.87 - 5.11 MIL/uL   Hemoglobin 8.3 (L) 12.0 - 15.0 g/dL   HCT 24.7 (L) 36.0 - 46.0 %   MCV 92.9 78.0 - 100.0 fL   MCH 31.2 26.0 - 34.0 pg   MCHC 33.6 30.0 - 36.0 g/dL   RDW 16.0 (H) 11.5 - 15.5 %   Platelets 207 150 - 400 K/uL    Comment: Performed at [facility], [place] [address] [place], [place] [address]  Glucose, capillary     Status: Abnormal   Collection Time: 10/30/17  9:36 PM  Result Value Ref Range   Glucose-Capillary 103 (H) 65 - 99 mg/dL    Imaging / Studies: No results found.  Medications / Allergies: per chart  Antibiotics: Anti-infectives (From admission, onward)    Start     Dose/Rate Route Frequency Ordered Stop   10/26/17 0100  cefoTEtan (CEFOTAN) 2 g in sodium chloride 0.9 % 100 mL IVPB     2 g 200 mL/hr over 30 Minutes Intravenous Every 12 hours 10/25/17 1802 10/26/17 0102   10/25/17 2000  valACYclovir (VALTREX) tablet 500 mg     500 mg Oral Daily 10/25/17 1802     10/25/17 0930  clindamycin (CLEOCIN) 900 mg, gentamicin (GARAMYCIN) 240 mg in sodium chloride 0.9 % 1,000 mL for intraperitoneal lavage  Status:  Discontinued      Intraperitoneal To Surgery 10/25/17 0929 10/25/17 1738   10/25/17 0928  cefoTEtan in Dextrose 5% (CEFOTAN) IVPB 2 g     2 g Intravenous On call to O.R. 10/25/17 8416 10/25/17 1348   10/25/17 0928  neomycin (MYCIFRADIN) tablet 1,000 mg  Status:  Discontinued     1,000 mg Oral 3 times per day 10/25/17 0929 10/25/17 1738   10/25/17 0928  metroNIDAZOLE (FLAGYL) tablet 1,000 mg  Status:  Discontinued     1,000 mg Oral 3 times per day 10/25/17 6063 10/25/17 1738        Note: Portions of this report may have been transcribed using voice recognition software. Every effort was made to ensure accuracy; however, inadvertent computerized transcription errors may be present.   Any transcriptional errors that result from this process are unintentional.     Adin Hector, M.D., F.A.C.S. Gastrointestinal and Minimally Invasive Surgery [facility] [address] [place], [place] [address] [phone] Main / Paging   10/31/2017

## 2017-11-01 LAB — CBC
HCT: 26.3 % — ABNORMAL LOW (ref 36.0–46.0)
HEMOGLOBIN: 8.7 g/dL — AB (ref 12.0–15.0)
MCH: 31.8 pg (ref 26.0–34.0)
MCHC: 33.1 g/dL (ref 30.0–36.0)
MCV: 96 fL (ref 78.0–100.0)
Platelets: 316 10*3/uL (ref 150–400)
RBC: 2.74 MIL/uL — ABNORMAL LOW (ref 3.87–5.11)
RDW: 16.3 % — AB (ref 11.5–15.5)
WBC: 8.1 10*3/uL (ref 4.0–10.5)

## 2017-11-01 LAB — BASIC METABOLIC PANEL
Anion gap: 6 (ref 5–15)
BUN: 11 mg/dL (ref 6–20)
CALCIUM: 8.9 mg/dL (ref 8.9–10.3)
CHLORIDE: 111 mmol/L (ref 101–111)
CO2: 27 mmol/L (ref 22–32)
CREATININE: 0.84 mg/dL (ref 0.44–1.00)
GFR calc Af Amer: >60 mL/min (ref >60)
GFR calc non Af Amer: >60 mL/min (ref >60)
Glucose, Bld: 96 mg/dL (ref 65–99)
Potassium: 4.2 mmol/L (ref 3.5–5.1)
SODIUM: 144 mmol/L (ref 135–145)

## 2017-11-01 MED ORDER — GABAPENTIN 300 MG PO CAPS: 300.0000 mg | ORAL_CAPSULE | Freq: Every day | ORAL | 0 refills | Status: DC

## 2017-11-01 MED ORDER — METHOCARBAMOL 750 MG PO TABS: 750.0000 mg | ORAL_TABLET | Freq: Three times a day (TID) | ORAL | 0 refills | Status: DC

## 2017-11-01 MED ORDER — IBUPROFEN 400 MG PO TABS: 400.0000 mg | ORAL_TABLET | Freq: Four times a day (QID) | ORAL | 0 refills | Status: AC | PRN

## 2017-11-01 MED ORDER — SULFAMETHOXAZOLE-TRIMETHOPRIM 800-160 MG PO TABS
1.0000 | ORAL_TABLET | Freq: Two times a day (BID) | ORAL | 1 refills | Status: DC
Start: 2017-11-01 — End: 2019-06-17

## 2017-11-01 NOTE — Progress Notes (Signed)
PT Cancellation Note  Patient Details Name: Milinda PointerJill Maish MRN: [id number] DOB: 04/16/1961   Cancelled Treatment:    Reason Eval/Treat Not Completed: PT screened, no needs identified, will sign off. Pt reports she has been ambulating hallway.  She walked 6-7 laps yesterday and 3-4 laps this AM already.  She politely declined PT.  Nurse confirmed pt's ambulation in hallway.   Enzo MontgomeryKaren L Glyn Gerads 11/01/2017, 9:05 AM

## 2017-11-02 LAB — URINE CULTURE

## 2017-11-05 LAB — CULTURE, BLOOD (ROUTINE X 2)
CULTURE: NO GROWTH
CULTURE: NO GROWTH
SPECIAL REQUESTS: ADEQUATE
Special Requests: ADEQUATE

## 2017-11-28 DIAGNOSIS — R399 Unspecified symptoms and signs involving the genitourinary system: Secondary | ICD-10-CM | POA: Diagnosis not present

## 2017-11-28 DIAGNOSIS — R1010 Upper abdominal pain, unspecified: Secondary | ICD-10-CM | POA: Diagnosis not present

## 2017-11-29 ENCOUNTER — Other Ambulatory Visit: Payer: Self-pay | Admitting: Family Medicine

## 2017-11-29 ENCOUNTER — Ambulatory Visit
Admission: RE | Admit: 2017-11-29 | Discharge: 2017-11-29 | Disposition: A | Discharge status: Home / Self Care | Payer: BLUE CROSS/BLUE SHIELD | Source: Ambulatory Visit | Attending: Family Medicine | Admitting: Family Medicine

## 2017-11-29 DIAGNOSIS — L821 Other seborrheic keratosis: Secondary | ICD-10-CM | POA: Diagnosis not present

## 2017-11-29 DIAGNOSIS — L814 Other melanin hyperpigmentation: Secondary | ICD-10-CM | POA: Diagnosis not present

## 2017-11-29 DIAGNOSIS — L400 Psoriasis vulgaris: Secondary | ICD-10-CM | POA: Diagnosis not present

## 2017-11-29 DIAGNOSIS — D225 Melanocytic nevi of trunk: Secondary | ICD-10-CM | POA: Diagnosis not present

## 2017-11-29 DIAGNOSIS — R1010 Upper abdominal pain, unspecified: Secondary | ICD-10-CM

## 2017-11-29 DIAGNOSIS — R109 Unspecified abdominal pain: Secondary | ICD-10-CM | POA: Diagnosis not present

## 2017-11-29 MED ORDER — IOPAMIDOL (ISOVUE-300) INJECTION 61%
100.0000 mL | Freq: Once | INTRAVENOUS | Status: AC | PRN
  Administered 2017-11-29: 100 mL via INTRAVENOUS

## 2017-12-12 DIAGNOSIS — F331 Major depressive disorder, recurrent, moderate: Secondary | ICD-10-CM | POA: Diagnosis not present

## 2017-12-19 DIAGNOSIS — E2839 Other primary ovarian failure: Secondary | ICD-10-CM | POA: Diagnosis not present

## 2017-12-19 DIAGNOSIS — M8588 Other specified disorders of bone density and structure, other site: Secondary | ICD-10-CM | POA: Diagnosis not present

## 2017-12-28 DIAGNOSIS — H538 Other visual disturbances: Secondary | ICD-10-CM | POA: Diagnosis not present

## 2017-12-28 DIAGNOSIS — R51 Headache: Secondary | ICD-10-CM | POA: Diagnosis not present

## 2017-12-28 DIAGNOSIS — G35 Multiple sclerosis: Secondary | ICD-10-CM | POA: Diagnosis not present

## 2017-12-28 DIAGNOSIS — R030 Elevated blood-pressure reading, without diagnosis of hypertension: Secondary | ICD-10-CM | POA: Diagnosis not present

## 2017-12-28 DIAGNOSIS — Z885 Allergy status to narcotic agent status: Secondary | ICD-10-CM | POA: Diagnosis not present

## 2017-12-28 DIAGNOSIS — Z886 Allergy status to analgesic agent status: Secondary | ICD-10-CM | POA: Diagnosis not present

## 2017-12-28 DIAGNOSIS — H539 Unspecified visual disturbance: Secondary | ICD-10-CM | POA: Diagnosis not present

## 2017-12-28 DIAGNOSIS — Z881 Allergy status to other antibiotic agents status: Secondary | ICD-10-CM | POA: Diagnosis not present

## 2017-12-29 DIAGNOSIS — I371 Nonrheumatic pulmonary valve insufficiency: Secondary | ICD-10-CM | POA: Diagnosis not present

## 2017-12-29 DIAGNOSIS — R51 Headache: Secondary | ICD-10-CM | POA: Diagnosis not present

## 2017-12-29 DIAGNOSIS — R030 Elevated blood-pressure reading, without diagnosis of hypertension: Secondary | ICD-10-CM | POA: Diagnosis not present

## 2017-12-29 DIAGNOSIS — H538 Other visual disturbances: Secondary | ICD-10-CM | POA: Diagnosis not present

## 2017-12-29 DIAGNOSIS — G459 Transient cerebral ischemic attack, unspecified: Secondary | ICD-10-CM | POA: Diagnosis not present

## 2017-12-30 DIAGNOSIS — H538 Other visual disturbances: Secondary | ICD-10-CM | POA: Diagnosis not present

## 2017-12-30 DIAGNOSIS — H539 Unspecified visual disturbance: Secondary | ICD-10-CM | POA: Diagnosis not present

## 2017-12-30 DIAGNOSIS — R51 Headache: Secondary | ICD-10-CM | POA: Diagnosis not present

## 2017-12-30 DIAGNOSIS — R030 Elevated blood-pressure reading, without diagnosis of hypertension: Secondary | ICD-10-CM | POA: Diagnosis not present

## 2018-01-14 DIAGNOSIS — F331 Major depressive disorder, recurrent, moderate: Secondary | ICD-10-CM | POA: Diagnosis not present

## 2018-03-04 ENCOUNTER — Other Ambulatory Visit: Payer: Self-pay | Admitting: Family Medicine

## 2018-03-04 DIAGNOSIS — Z1231 Encounter for screening mammogram for malignant neoplasm of breast: Secondary | ICD-10-CM

## 2018-03-10 DIAGNOSIS — J3089 Other allergic rhinitis: Secondary | ICD-10-CM | POA: Diagnosis not present

## 2018-03-10 DIAGNOSIS — J4540 Moderate persistent asthma, uncomplicated: Secondary | ICD-10-CM | POA: Diagnosis not present

## 2018-03-10 DIAGNOSIS — J301 Allergic rhinitis due to pollen: Secondary | ICD-10-CM | POA: Diagnosis not present

## 2018-03-20 DIAGNOSIS — F331 Major depressive disorder, recurrent, moderate: Secondary | ICD-10-CM | POA: Diagnosis not present

## 2018-03-31 ENCOUNTER — Ambulatory Visit: Payer: Self-pay

## 2018-04-02 DIAGNOSIS — H5212 Myopia, left eye: Secondary | ICD-10-CM | POA: Diagnosis not present

## 2018-04-02 DIAGNOSIS — H524 Presbyopia: Secondary | ICD-10-CM | POA: Diagnosis not present

## 2018-04-07 ENCOUNTER — Ambulatory Visit
Admission: RE | Admit: 2018-04-07 | Discharge: 2018-04-07 | Disposition: A | Discharge status: Home / Self Care | Payer: BLUE CROSS/BLUE SHIELD | Source: Ambulatory Visit | Attending: Family Medicine | Admitting: Family Medicine

## 2018-04-07 DIAGNOSIS — Z1231 Encounter for screening mammogram for malignant neoplasm of breast: Secondary | ICD-10-CM

## 2018-04-08 DIAGNOSIS — M8580 Other specified disorders of bone density and structure, unspecified site: Secondary | ICD-10-CM | POA: Diagnosis not present

## 2018-04-22 DIAGNOSIS — F4321 Adjustment disorder with depressed mood: Secondary | ICD-10-CM | POA: Diagnosis not present

## 2018-05-20 DIAGNOSIS — F4321 Adjustment disorder with depressed mood: Secondary | ICD-10-CM | POA: Diagnosis not present

## 2018-05-23 DIAGNOSIS — L814 Other melanin hyperpigmentation: Secondary | ICD-10-CM | POA: Diagnosis not present

## 2018-05-23 DIAGNOSIS — L812 Freckles: Secondary | ICD-10-CM | POA: Diagnosis not present

## 2018-05-23 DIAGNOSIS — L821 Other seborrheic keratosis: Secondary | ICD-10-CM | POA: Diagnosis not present

## 2018-05-23 DIAGNOSIS — D225 Melanocytic nevi of trunk: Secondary | ICD-10-CM | POA: Diagnosis not present

## 2018-06-19 DIAGNOSIS — Z13220 Encounter for screening for lipoid disorders: Secondary | ICD-10-CM | POA: Diagnosis not present

## 2018-06-19 DIAGNOSIS — Z0000 Encounter for general adult medical examination without abnormal findings: Secondary | ICD-10-CM | POA: Diagnosis not present

## 2018-06-19 DIAGNOSIS — Z8249 Family history of ischemic heart disease and other diseases of the circulatory system: Secondary | ICD-10-CM | POA: Diagnosis not present

## 2018-06-24 DIAGNOSIS — F4321 Adjustment disorder with depressed mood: Secondary | ICD-10-CM | POA: Diagnosis not present

## 2018-08-12 DIAGNOSIS — F4321 Adjustment disorder with depressed mood: Secondary | ICD-10-CM | POA: Diagnosis not present

## 2018-09-11 DIAGNOSIS — R040 Epistaxis: Secondary | ICD-10-CM | POA: Diagnosis not present

## 2018-09-11 DIAGNOSIS — J301 Allergic rhinitis due to pollen: Secondary | ICD-10-CM | POA: Diagnosis not present

## 2018-09-11 DIAGNOSIS — J3089 Other allergic rhinitis: Secondary | ICD-10-CM | POA: Diagnosis not present

## 2018-09-11 DIAGNOSIS — J4540 Moderate persistent asthma, uncomplicated: Secondary | ICD-10-CM | POA: Diagnosis not present

## 2018-09-19 DIAGNOSIS — J4540 Moderate persistent asthma, uncomplicated: Secondary | ICD-10-CM | POA: Diagnosis not present

## 2018-09-19 DIAGNOSIS — J301 Allergic rhinitis due to pollen: Secondary | ICD-10-CM | POA: Diagnosis not present

## 2018-09-19 DIAGNOSIS — J3089 Other allergic rhinitis: Secondary | ICD-10-CM | POA: Diagnosis not present

## 2018-09-19 DIAGNOSIS — J4541 Moderate persistent asthma with (acute) exacerbation: Secondary | ICD-10-CM | POA: Diagnosis not present

## 2018-11-17 DIAGNOSIS — F4321 Adjustment disorder with depressed mood: Secondary | ICD-10-CM | POA: Diagnosis not present

## 2018-11-18 DIAGNOSIS — L400 Psoriasis vulgaris: Secondary | ICD-10-CM | POA: Diagnosis not present

## 2018-11-18 DIAGNOSIS — L821 Other seborrheic keratosis: Secondary | ICD-10-CM | POA: Diagnosis not present

## 2018-11-18 DIAGNOSIS — D225 Melanocytic nevi of trunk: Secondary | ICD-10-CM | POA: Diagnosis not present

## 2018-11-18 DIAGNOSIS — L718 Other rosacea: Secondary | ICD-10-CM | POA: Diagnosis not present

## 2018-12-01 DIAGNOSIS — F4321 Adjustment disorder with depressed mood: Secondary | ICD-10-CM | POA: Diagnosis not present

## 2018-12-29 DIAGNOSIS — K625 Hemorrhage of anus and rectum: Secondary | ICD-10-CM | POA: Diagnosis not present

## 2018-12-29 DIAGNOSIS — K648 Other hemorrhoids: Secondary | ICD-10-CM | POA: Diagnosis not present

## 2019-01-20 DIAGNOSIS — K921 Melena: Secondary | ICD-10-CM | POA: Diagnosis not present

## 2019-01-20 DIAGNOSIS — K648 Other hemorrhoids: Secondary | ICD-10-CM | POA: Diagnosis not present

## 2019-02-26 ENCOUNTER — Other Ambulatory Visit: Payer: Self-pay | Admitting: Family Medicine

## 2019-02-26 DIAGNOSIS — Z1231 Encounter for screening mammogram for malignant neoplasm of breast: Secondary | ICD-10-CM

## 2019-03-13 DIAGNOSIS — N952 Postmenopausal atrophic vaginitis: Secondary | ICD-10-CM | POA: Diagnosis not present

## 2019-04-07 DIAGNOSIS — J4540 Moderate persistent asthma, uncomplicated: Secondary | ICD-10-CM | POA: Diagnosis not present

## 2019-04-07 DIAGNOSIS — R040 Epistaxis: Secondary | ICD-10-CM | POA: Diagnosis not present

## 2019-04-07 DIAGNOSIS — J301 Allergic rhinitis due to pollen: Secondary | ICD-10-CM | POA: Diagnosis not present

## 2019-04-07 DIAGNOSIS — J3089 Other allergic rhinitis: Secondary | ICD-10-CM | POA: Diagnosis not present

## 2019-04-07 DIAGNOSIS — J4541 Moderate persistent asthma with (acute) exacerbation: Secondary | ICD-10-CM | POA: Diagnosis not present

## 2019-04-10 ENCOUNTER — Other Ambulatory Visit: Payer: Self-pay

## 2019-04-10 ENCOUNTER — Ambulatory Visit
Admission: RE | Admit: 2019-04-10 | Discharge: 2019-04-10 | Disposition: A | Discharge status: Home / Self Care | Payer: BC Managed Care – PPO | Source: Ambulatory Visit | Attending: Family Medicine | Admitting: Family Medicine

## 2019-04-10 DIAGNOSIS — Z1231 Encounter for screening mammogram for malignant neoplasm of breast: Secondary | ICD-10-CM

## 2019-04-12 ENCOUNTER — Other Ambulatory Visit: Payer: Self-pay

## 2019-04-12 ENCOUNTER — Encounter (HOSPITAL_COMMUNITY): Payer: Self-pay | Admitting: Emergency Medicine

## 2019-04-12 ENCOUNTER — Emergency Department (HOSPITAL_COMMUNITY): Payer: BC Managed Care – PPO

## 2019-04-12 ENCOUNTER — Emergency Department (HOSPITAL_COMMUNITY)
Admission: EM | Admit: 2019-04-12 | Discharge: 2019-04-12 | Discharge status: Against Medical Advice | Payer: BC Managed Care – PPO | Attending: Emergency Medicine | Admitting: Emergency Medicine

## 2019-04-12 DIAGNOSIS — R0602 Shortness of breath: Secondary | ICD-10-CM | POA: Diagnosis not present

## 2019-04-12 DIAGNOSIS — Z5321 Procedure and treatment not carried out due to patient leaving prior to being seen by health care provider: Secondary | ICD-10-CM | POA: Diagnosis not present

## 2019-04-12 NOTE — ED Triage Notes (Signed)
Patient complaining of sob. Patient states it started a week ago. Patient states that she went to primary care dr and he put her on medication. Patient states it feels like the medication is not working because she still feels sob.

## 2019-04-12 NOTE — ED Notes (Signed)
Pt declined to have an EKG done.

## 2019-04-30 DIAGNOSIS — F4321 Adjustment disorder with depressed mood: Secondary | ICD-10-CM | POA: Diagnosis not present

## 2019-05-01 DIAGNOSIS — J4540 Moderate persistent asthma, uncomplicated: Secondary | ICD-10-CM | POA: Diagnosis not present

## 2019-05-01 DIAGNOSIS — J3089 Other allergic rhinitis: Secondary | ICD-10-CM | POA: Diagnosis not present

## 2019-05-01 DIAGNOSIS — J301 Allergic rhinitis due to pollen: Secondary | ICD-10-CM | POA: Diagnosis not present

## 2019-05-01 DIAGNOSIS — R040 Epistaxis: Secondary | ICD-10-CM | POA: Diagnosis not present

## 2019-05-05 DIAGNOSIS — H5212 Myopia, left eye: Secondary | ICD-10-CM | POA: Diagnosis not present

## 2019-05-05 DIAGNOSIS — H524 Presbyopia: Secondary | ICD-10-CM | POA: Diagnosis not present

## 2019-05-25 DIAGNOSIS — D1801 Hemangioma of skin and subcutaneous tissue: Secondary | ICD-10-CM | POA: Diagnosis not present

## 2019-05-25 DIAGNOSIS — L400 Psoriasis vulgaris: Secondary | ICD-10-CM | POA: Diagnosis not present

## 2019-05-25 DIAGNOSIS — L821 Other seborrheic keratosis: Secondary | ICD-10-CM | POA: Diagnosis not present

## 2019-05-25 DIAGNOSIS — L812 Freckles: Secondary | ICD-10-CM | POA: Diagnosis not present

## 2019-05-29 DIAGNOSIS — F4321 Adjustment disorder with depressed mood: Secondary | ICD-10-CM | POA: Diagnosis not present

## 2019-06-17 ENCOUNTER — Ambulatory Visit: Payer: Self-pay

## 2019-06-17 ENCOUNTER — Other Ambulatory Visit: Payer: Self-pay

## 2019-06-17 ENCOUNTER — Ambulatory Visit (INDEPENDENT_AMBULATORY_CARE_PROVIDER_SITE_OTHER): Payer: BC Managed Care – PPO | Admitting: Family Medicine

## 2019-06-17 DIAGNOSIS — M25551 Pain in right hip: Secondary | ICD-10-CM

## 2019-06-17 DIAGNOSIS — M25561 Pain in right knee: Secondary | ICD-10-CM | POA: Diagnosis not present

## 2019-06-17 MED ORDER — TRAMADOL HCL 50 MG PO TABS: 50.0000 mg | ORAL_TABLET | Freq: Four times a day (QID) | ORAL | 0 refills | Status: AC | PRN

## 2019-06-17 MED ORDER — PREDNISONE 10 MG PO TABS: ORAL_TABLET | ORAL | 0 refills | Status: AC

## 2019-06-17 NOTE — Progress Notes (Signed)
Office Visit Note   Patient: Tara [name]           Date of Birth: 11-24-1960           MRN: [id number] Visit Date: 06/17/2019 Requested by: Carol Ada, [place],  [place] [id number] PCP: Carol Ada, MD  Subjective: Chief Complaint  Patient presents with  . Right Hip - Pain    Pain in right groin and lateral hip since yesterday. NKI. Did experience bilateral groin pain back before covid this year, while doing yoga -   . Right Knee - Pain    Pain started this afternoon. S/p partial knee replacement in that knee. NKI.     HPI: She is here with right hip and knee pain.  Right hip started bothering her yesterday with no injury.  She seemed fine in the morning, but then that she sat down for 20 minutes and when she tried to stand back up she has severe pain.  Pain is on the lateral aspect and in the groin area.  She has had some adductor tendon soreness for several months after doing yoga, but this pain is totally different.  She is never really had troubles with her hip like this.  She is status post unicompartmental replacement of the right knee many years ago.  It has started to bother her in the last day as well.  Pain is not nearly as severe as the hip.  Patient has a history of psoriasis but no history of psoriatic arthritis.  She has a history of gout in her sister but patient has never had it.              ROS: No fevers or chills.  All other systems were reviewed and are negative.  Objective: Vital Signs: LMP 08/30/2010   Physical Exam:  General:  Alert and oriented, in no acute distress. Pulm:  Breathing unlabored. Psy:  Normal mood, congruent affect.  Right hip: She is tender to palpation over the greater trochanter.  She has moderate pain with passive hip flexion and internal rotation but still good range of motion.  She has pain with adduction and abduction against resistance, no pain with flexion against resistance. Right knee:  No significant effusion, good range of motion.  Slightly tender medially.  Imaging: X-rays right hip: Overall joint spaces well-preserved.  I believe she has chondrocalcinosis on the femoral head.  She has bony spurs at the greater trochanter.  No sign of stress fracture.  X-rays right knee: Chondrocalcinosis in the lateral joint space.  Medial prosthesis is intact with no sign of loosening.    Assessment & Plan: 1.  Acute right hip and knee pain, suspicious for crystal arthropathy -We will treat with prednisone.  Tramadol as needed.  If she has recurrent symptoms, we will do some more diagnostic testing such as joint aspiration if able, serum uric acid.     Procedures: No procedures performed  No notes on file     PMFS History: Patient Active Problem List   Diagnosis Date Noted  . Postoperative anemia 10/27/2017  . Diverticulitis of left colon s/p left colectomy 10/25/2017 10/25/2017  . Acute cholecystitis with chronic cholecystitis s/p lap cholecystectomy 10/25/2017 10/25/2017  . Patellofemoral pain syndrome of right knee 05/07/2017  . Asthma in adult 01/24/2016  . Gastroesophageal reflux disease without esophagitis   . Insomnia   . Generalized anxiety disorder   . MDD (major depressive disorder), recurrent severe,  without psychosis (HCC) 09/30/2015  . Bilateral carpal tunnel syndrome 03/15/2015   Past Medical History:  Diagnosis Date  . Abnormal urinalysis 01/24/2016  . Acute cystitis without hematuria   . ADD (attention deficit disorder)   . Asthma   . Chest pain 01/25/2016  . Depression   . Diverticulitis   . Left upper extremity swelling   . MDD (major depressive disorder), recurrent severe, without psychosis (HCC) 09/30/2015  . Memory changes 01/18/2016  . Psoriasis   . S/P right unicompartmental knee replacement 05/07/2017    Family History  Problem Relation Age of Onset  . COPD Mother   . Hypertension Mother   . Heart failure Father   . Hypertension Father    . Hypertension Sister   . Heart attack Brother   . Hypertension Brother   . Diabetes Brother   . Diabetes Maternal Grandmother   . Breast cancer Maternal Aunt   . Breast cancer Cousin   . Dementia Neg Hx     Past Surgical History:  Procedure Laterality Date  . APPENDECTOMY    . CHOLECYSTECTOMY N/A 10/25/2017   Procedure: ROBOTIC ASSISTED LAPAROSCOPIC CHOLECYSTECTOMY;  Surgeon: Karie Soda, MD;  Location: [facility];  Service: General;  Laterality: N/A;  . KNEE SURGERY Right   . PROCTOSCOPY N/A 10/25/2017   Procedure: RIGID PROCTOSCOPY;  Surgeon: Karie Soda, MD;  Location: [facility];  Service: General;  Laterality: N/A;  . ROBOT ASSISTED LAPAROSCOPIC PARTIAL COLECTOMY  10/25/2017   descending/sigmoid colon resection for recurrent diverticulitis.  Dr. Michaell Cowing  . ROBOTIC ASSISTED LAPAROSCOPIC CHOLECYSTECTOMY  10/25/2017   For acute on chronic cholecystitis.  Dr. Michaell Cowing   Social History   Occupational History  . Occupation: Academic librarian   Tobacco Use  . Smoking status: Never Smoker  . Smokeless tobacco: Never Used  Substance and Sexual Activity  . Alcohol use: Yes    Alcohol/week: 0.0 standard drinks    Comment: occasionally  . Drug use: No  . Sexual activity: Yes

## 2019-07-06 DIAGNOSIS — Z0000 Encounter for general adult medical examination without abnormal findings: Secondary | ICD-10-CM | POA: Diagnosis not present

## 2019-07-06 DIAGNOSIS — Z8249 Family history of ischemic heart disease and other diseases of the circulatory system: Secondary | ICD-10-CM | POA: Diagnosis not present

## 2019-07-06 DIAGNOSIS — Z13220 Encounter for screening for lipoid disorders: Secondary | ICD-10-CM | POA: Diagnosis not present

## 2019-07-06 DIAGNOSIS — Z23 Encounter for immunization: Secondary | ICD-10-CM | POA: Diagnosis not present

## 2019-07-06 DIAGNOSIS — M109 Gout, unspecified: Secondary | ICD-10-CM | POA: Diagnosis not present

## 2019-07-07 DIAGNOSIS — Z6821 Body mass index (BMI) 21.0-21.9, adult: Secondary | ICD-10-CM | POA: Diagnosis not present

## 2019-07-07 DIAGNOSIS — Z20828 Contact with and (suspected) exposure to other viral communicable diseases: Secondary | ICD-10-CM | POA: Diagnosis not present

## 2019-07-29 DIAGNOSIS — H00021 Hordeolum internum right upper eyelid: Secondary | ICD-10-CM | POA: Diagnosis not present

## 2019-07-31 DIAGNOSIS — F4321 Adjustment disorder with depressed mood: Secondary | ICD-10-CM | POA: Diagnosis not present

## 2019-08-03 DIAGNOSIS — H00013 Hordeolum externum right eye, unspecified eyelid: Secondary | ICD-10-CM | POA: Diagnosis not present

## 2019-08-06 DIAGNOSIS — H00021 Hordeolum internum right upper eyelid: Secondary | ICD-10-CM | POA: Diagnosis not present

## 2019-08-13 DIAGNOSIS — H0011 Chalazion right upper eyelid: Secondary | ICD-10-CM | POA: Diagnosis not present

## 2019-08-28 DIAGNOSIS — F4321 Adjustment disorder with depressed mood: Secondary | ICD-10-CM | POA: Diagnosis not present

## 2019-09-01 DIAGNOSIS — H02841 Edema of right upper eyelid: Secondary | ICD-10-CM | POA: Diagnosis not present

## 2019-09-14 ENCOUNTER — Other Ambulatory Visit: Payer: BC Managed Care – PPO

## 2019-09-15 DIAGNOSIS — F4321 Adjustment disorder with depressed mood: Secondary | ICD-10-CM | POA: Diagnosis not present

## 2019-09-15 DIAGNOSIS — H02841 Edema of right upper eyelid: Secondary | ICD-10-CM | POA: Diagnosis not present

## 2019-09-20 ENCOUNTER — Ambulatory Visit: Payer: BC Managed Care – PPO | Attending: Internal Medicine

## 2019-09-20 DIAGNOSIS — Z23 Encounter for immunization: Secondary | ICD-10-CM

## 2019-09-20 NOTE — Progress Notes (Signed)
   Covid-19 Vaccination Clinic  Name:  Nikkia Devoss    MRN: [id number] DOB: 03-05-1961  09/20/2019  Ms. Florentino was observed post Covid-19 immunization for 30 minutes based on pre-vaccination screening without incident. She was provided with Vaccine Information Sheet and instruction to access the V-Safe system.   Ms. Rhines was instructed to call 911 with any severe reactions post vaccine: Marland Kitchen Difficulty breathing  . Swelling of face and throat  . A fast heartbeat  . A bad rash all over body  . Dizziness and weakness   Immunizations Administered    Name Date Dose VIS Date Route   Pfizer COVID-19 Vaccine 09/20/2019  8:31 AM 0.3 mL 06/26/2019 Intramuscular   Manufacturer: [employer], [employer]   Lot: [id number]   NDC: [address]-9

## 2019-10-19 DIAGNOSIS — J3089 Other allergic rhinitis: Secondary | ICD-10-CM | POA: Diagnosis not present

## 2019-10-19 DIAGNOSIS — J4540 Moderate persistent asthma, uncomplicated: Secondary | ICD-10-CM | POA: Diagnosis not present

## 2019-10-19 DIAGNOSIS — J301 Allergic rhinitis due to pollen: Secondary | ICD-10-CM | POA: Diagnosis not present

## 2019-10-19 DIAGNOSIS — R040 Epistaxis: Secondary | ICD-10-CM | POA: Diagnosis not present

## 2019-10-20 ENCOUNTER — Ambulatory Visit: Payer: BC Managed Care – PPO | Attending: Internal Medicine

## 2019-10-20 DIAGNOSIS — Z23 Encounter for immunization: Secondary | ICD-10-CM

## 2019-10-20 NOTE — Progress Notes (Signed)
   Covid-19 Vaccination Clinic  Name:  Annelie Boak    MRN: [id number] DOB: [birth year]  10/20/2019  Ms. Dulak was observed post Covid-19 immunization for 15 minutes without incident. She was provided with Vaccine Information Sheet and instruction to access the V-Safe system.   Ms. Mooneyhan was instructed to call 911 with any severe reactions post vaccine: Marland Kitchen Difficulty breathing  . Swelling of face and throat  . A fast heartbeat  . A bad rash all over body  . Dizziness and weakness   Immunizations Administered    Name Date Dose VIS Date Route   Pfizer COVID-19 Vaccine 10/20/2019 10:53 AM 0.3 mL 06/26/2019 Intramuscular   Manufacturer: [employer], [employer]   Lot: [id number]   NDC: [address]-2

## 2019-11-23 DIAGNOSIS — L821 Other seborrheic keratosis: Secondary | ICD-10-CM | POA: Diagnosis not present

## 2019-11-23 DIAGNOSIS — L400 Psoriasis vulgaris: Secondary | ICD-10-CM | POA: Diagnosis not present

## 2019-11-23 DIAGNOSIS — L814 Other melanin hyperpigmentation: Secondary | ICD-10-CM | POA: Diagnosis not present

## 2019-11-23 DIAGNOSIS — D225 Melanocytic nevi of trunk: Secondary | ICD-10-CM | POA: Diagnosis not present
